# Patient Record
Sex: Female | Born: 1970 | Race: Black or African American | Hispanic: No | Marital: Married | Smoking: Never smoker
Health system: Southern US, Community
[De-identification: ages and names within clinical notes are randomized; demographics above are authoritative.]

## PROBLEM LIST (undated history)

## (undated) DIAGNOSIS — F419 Anxiety disorder, unspecified: Secondary | ICD-10-CM

## (undated) DIAGNOSIS — C50919 Malignant neoplasm of unspecified site of unspecified female breast: Secondary | ICD-10-CM

## (undated) DIAGNOSIS — Z803 Family history of malignant neoplasm of breast: Secondary | ICD-10-CM

## (undated) DIAGNOSIS — T7840XA Allergy, unspecified, initial encounter: Secondary | ICD-10-CM

## (undated) DIAGNOSIS — K635 Polyp of colon: Secondary | ICD-10-CM

## (undated) DIAGNOSIS — O009 Unspecified ectopic pregnancy without intrauterine pregnancy: Secondary | ICD-10-CM

## (undated) DIAGNOSIS — Z8042 Family history of malignant neoplasm of prostate: Secondary | ICD-10-CM

## (undated) DIAGNOSIS — Z923 Personal history of irradiation: Secondary | ICD-10-CM

## (undated) HISTORY — DX: Family history of malignant neoplasm of prostate: Z80.42

## (undated) HISTORY — DX: Malignant neoplasm of unspecified site of unspecified female breast: C50.919

## (undated) HISTORY — DX: Polyp of colon: K63.5

## (undated) HISTORY — DX: Family history of malignant neoplasm of breast: Z80.3

---
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
## 1990-11-17 HISTORY — PX: LYMPHADENECTOMY: SHX15

## 1996-11-17 HISTORY — PX: CHOLECYSTECTOMY: SHX55

## 1998-03-20 ENCOUNTER — Ambulatory Visit (HOSPITAL_COMMUNITY): Admission: RE | Admit: 1998-03-20 | Discharge: 1998-03-21 | Payer: Self-pay | Admitting: General Surgery

## 1998-05-08 ENCOUNTER — Ambulatory Visit (HOSPITAL_COMMUNITY): Admission: RE | Admit: 1998-05-08 | Discharge: 1998-05-08 | Payer: Self-pay | Admitting: Obstetrics

## 1998-07-04 ENCOUNTER — Other Ambulatory Visit: Admission: RE | Admit: 1998-07-04 | Discharge: 1998-07-04 | Payer: Self-pay | Admitting: Obstetrics

## 1998-08-16 ENCOUNTER — Ambulatory Visit (HOSPITAL_COMMUNITY): Admission: RE | Admit: 1998-08-16 | Discharge: 1998-08-16 | Payer: Self-pay | Admitting: Obstetrics

## 1998-08-16 ENCOUNTER — Other Ambulatory Visit: Admission: RE | Admit: 1998-08-16 | Discharge: 1998-08-16 | Payer: Self-pay | Admitting: Obstetrics

## 2000-04-27 ENCOUNTER — Encounter: Payer: Self-pay | Admitting: Obstetrics

## 2000-04-27 ENCOUNTER — Inpatient Hospital Stay (HOSPITAL_COMMUNITY): Admission: AD | Admit: 2000-04-27 | Discharge: 2000-04-27 | Payer: Self-pay | Admitting: Obstetrics

## 2000-05-04 ENCOUNTER — Other Ambulatory Visit: Admission: RE | Admit: 2000-05-04 | Discharge: 2000-05-04 | Payer: Self-pay | Admitting: Obstetrics

## 2000-11-02 ENCOUNTER — Observation Stay (HOSPITAL_COMMUNITY): Admission: AD | Admit: 2000-11-02 | Discharge: 2000-11-03 | Payer: Self-pay | Admitting: Obstetrics

## 2000-11-03 ENCOUNTER — Encounter: Payer: Self-pay | Admitting: *Deleted

## 2000-11-22 ENCOUNTER — Inpatient Hospital Stay (HOSPITAL_COMMUNITY): Admission: AD | Admit: 2000-11-22 | Discharge: 2000-11-22 | Payer: Self-pay | Admitting: Obstetrics

## 2000-11-26 ENCOUNTER — Inpatient Hospital Stay (HOSPITAL_COMMUNITY): Admission: AD | Admit: 2000-11-26 | Discharge: 2000-11-30 | Payer: Self-pay | Admitting: Obstetrics

## 2000-11-26 ENCOUNTER — Encounter: Payer: Self-pay | Admitting: Obstetrics

## 2005-10-17 HISTORY — PX: SALPINGECTOMY: SHX328

## 2005-11-04 ENCOUNTER — Inpatient Hospital Stay (HOSPITAL_COMMUNITY): Admission: AD | Admit: 2005-11-04 | Discharge: 2005-11-04 | Payer: Self-pay | Admitting: Obstetrics

## 2005-11-07 ENCOUNTER — Inpatient Hospital Stay (HOSPITAL_COMMUNITY): Admission: AD | Admit: 2005-11-07 | Discharge: 2005-11-07 | Payer: Self-pay | Admitting: Obstetrics

## 2005-11-10 ENCOUNTER — Inpatient Hospital Stay (HOSPITAL_COMMUNITY): Admission: AD | Admit: 2005-11-10 | Discharge: 2005-11-10 | Payer: Self-pay | Admitting: Obstetrics

## 2005-11-12 ENCOUNTER — Encounter (INDEPENDENT_AMBULATORY_CARE_PROVIDER_SITE_OTHER): Payer: Self-pay | Admitting: Specialist

## 2005-11-12 ENCOUNTER — Inpatient Hospital Stay (HOSPITAL_COMMUNITY): Admission: AD | Admit: 2005-11-12 | Discharge: 2005-11-14 | Payer: Self-pay | Admitting: Obstetrics

## 2006-10-12 ENCOUNTER — Emergency Department (HOSPITAL_COMMUNITY): Admission: EM | Admit: 2006-10-12 | Discharge: 2006-10-12 | Payer: Self-pay | Admitting: Family Medicine

## 2006-11-04 ENCOUNTER — Emergency Department (HOSPITAL_COMMUNITY): Admission: EM | Admit: 2006-11-04 | Discharge: 2006-11-04 | Payer: Self-pay | Admitting: Family Medicine

## 2007-02-27 ENCOUNTER — Inpatient Hospital Stay (HOSPITAL_COMMUNITY): Admission: AD | Admit: 2007-02-27 | Discharge: 2007-02-27 | Payer: Self-pay | Admitting: Obstetrics

## 2007-03-02 ENCOUNTER — Inpatient Hospital Stay (HOSPITAL_COMMUNITY): Admission: AD | Admit: 2007-03-02 | Discharge: 2007-03-02 | Payer: Self-pay | Admitting: Obstetrics

## 2007-03-04 ENCOUNTER — Observation Stay (HOSPITAL_COMMUNITY): Admission: AD | Admit: 2007-03-04 | Discharge: 2007-03-05 | Payer: Self-pay | Admitting: Obstetrics

## 2007-03-04 ENCOUNTER — Inpatient Hospital Stay (HOSPITAL_COMMUNITY): Admission: AD | Admit: 2007-03-04 | Discharge: 2007-03-04 | Payer: Self-pay | Admitting: Obstetrics

## 2007-03-08 ENCOUNTER — Inpatient Hospital Stay (HOSPITAL_COMMUNITY): Admission: AD | Admit: 2007-03-08 | Discharge: 2007-03-08 | Payer: Self-pay | Admitting: Obstetrics

## 2007-03-15 ENCOUNTER — Inpatient Hospital Stay (HOSPITAL_COMMUNITY): Admission: AD | Admit: 2007-03-15 | Discharge: 2007-03-15 | Payer: Self-pay | Admitting: Obstetrics

## 2007-03-22 ENCOUNTER — Inpatient Hospital Stay (HOSPITAL_COMMUNITY): Admission: AD | Admit: 2007-03-22 | Discharge: 2007-03-22 | Payer: Self-pay | Admitting: Obstetrics

## 2007-03-29 ENCOUNTER — Inpatient Hospital Stay (HOSPITAL_COMMUNITY): Admission: AD | Admit: 2007-03-29 | Discharge: 2007-03-29 | Payer: Self-pay | Admitting: Obstetrics

## 2009-06-05 ENCOUNTER — Ambulatory Visit (HOSPITAL_COMMUNITY): Admission: RE | Admit: 2009-06-05 | Discharge: 2009-06-05 | Payer: Self-pay | Admitting: Obstetrics

## 2010-12-08 ENCOUNTER — Encounter: Payer: Self-pay | Admitting: Obstetrics

## 2011-04-04 NOTE — Op Note (Signed)
NAME:  Rhonda Moss, Rhonda Moss               ACCOUNT NO.:  [id number]   MEDICAL RECORD NO.:  [id number]          PATIENT TYPE:  INP   LOCATION:  9310                          FACILITY:  [facility]   PHYSICIAN:  Kathreen Cosier, M.D.DATE OF BIRTH:  26-May-1971   DATE OF PROCEDURE:  11/12/2005  DATE OF DISCHARGE:                                 OPERATIVE REPORT   PREOPERATIVE DIAGNOSIS:  Ruptured ectopic pregnancy.   SURGEON:  Kathreen Cosier, M.D.   FIRST ASSISTANT:  Charles A. Clearance Coots, M.D.   ANESTHESIA:  General.   DESCRIPTION OF PROCEDURE:  The patient was placed on the operating room  table in the supine position.  Abdomen prepped and draped.  Bladder emptied  with the Foley catheter.  A transverse incision was made through the old  scar approximately four inches long, carried down to the fascia.  The fascia  was cleaned and incised the length of the incision.  Rectus muscles were  retracted laterally, peritoneum incised longitudinally.  There was about 200  mL of free blood in the peritoneal cavity.  The right tube and ovary were  normal.  Left ovary normal.  There was a large left ectopic pregnancy  occupying almost the complete tube and this was ruptured.  Kelly clamps  placed across the mesosalpinx below the tube and then the tube removed with  Metzenbaum scissors.  Hemostasis was achieved with interrupted sutures of 0  chromic. Hemostasis was satisfactory.  Abdomen closed in layers, peritoneum  continuous suture of 0 chromic, fascia with continuous suture of 0 Dexon and  the skin closed with subcuticular suture of 4-0 Monocryl.  The patient  tolerated the procedure well and taken to the recovery room in good  condition.           ______________________________  Kathreen Cosier, M.D.     BAM/MEDQ  D:  11/12/2005  T:  11/12/2005  Job:  [id number]

## 2011-04-04 NOTE — Op Note (Signed)
[facility]  Patient:    Rhonda Moss, Rhonda Moss                        MRN: [id number] Proc. Date: 11/27/00 Adm. Date:  [id number] Attending:  Venita Sheffield                           Operative Report  PREOPERATIVE DIAGNOSIS:       Prolonged ruptured membranes, failed induction                               of labor, at term.  SURGEON:                      Kathreen Cosier, M.D.  ANESTHESIA:                   Spinal.  DESCRIPTION OF PROCEDURE:     The patient is on the operating table in the supine position.  After the spinal anesthetic was administered, the abdomen was prepped and draped, done after the Foley catheter.  A transverse suprapubic incision was made and carried down to the rectus fascia.  The fascia was  cleanly incised the length of the incision.  The recti muscle were retracted laterally.  The peritoneum was incised longitudinally.  A transverse incision was made on the visceral peritoneum above the bladder, and the bladder was mobilized inferiorly.  A transverse lower uterine incision was made.  The fluid was clear.  The patient was delivered from the LAO position above [place].  Apgars were 8 and 9, weighing 7 pounds 8 ounces.  The team was in attendance.  The placenta was anterior and was removed manually.  The uterine cavity was cleaned with dry laps.  The uterine incision was closed in two layers with continuous suture of #1 chromic.  Hemostasis was satisfactory. The bladder flap was reattached with #2-0 chromic.  The lap and sponge counts were correct.  The abdomen was closed in layers.  The peritoneum closed with continuous suture of #0 chromic, the fascia with continuous suture of #0 Dexon, the skin closed with a subcuticular stitch of #0 plain.  The blood loss was 1000 cc. DD:  11/27/00 TD:  11/28/00 Job: [id number]

## 2011-04-04 NOTE — H&P (Signed)
[facility]  Patient:    LINDZEY, ZENT                        MRN: [id number] Adm. Date:  [id number] Attending:  Venita Sheffield                         History and Physical                                Patient is a 39 year old gravida 4, para 1-0-2-1 [facility] December 12, 2000 admitted on January 10 in the a.m. with ruptured membranes which occurred at 2:30 a.m.  Fluid was clear.  Negative group B strep.  Cervix was 1 cm, presenting, ______ floating.  Abdomen was obese and estimated fetal weight was done by ultrasound given estimated fetal weight 3823 g.  The patient was started on Pitocin.  She received Pitocin stimulation all day on January 10 and was rested overnight on January 10.  It was restarted 4 a.m. on January 11.  Patient received Pitocin up to 3:45 p.m. on the 11th.  She was contracting every two minutes but ______ cervix ______. It was decided she would deliver by cesarean section because of prolonged ruptured membranes, failed induction.  PHYSICAL EXAMINATION:  GENERAL:                      Obese female in no acute distress.  HEENT:                        Negative.  LUNGS:                        Clear.  HEART:                        Regular rhythm without murmurs, rubs, or gallops.  ABDOMEN:                      Term sized uterus.  PELVIC:                       Described above.  EXTREMITIES:                  Negative. DD:  11/27/00 TD:  11/27/00 Job: [id number]

## 2011-04-04 NOTE — Discharge Summary (Signed)
[facility]  Patient:    Rhonda Moss, Rhonda Moss                        MRN: [id number] Adm. Date:  [id number] Disc. Date: 11/30/00 Attending:  Venita Sheffield                           Discharge Summary  HISTORY OF PRESENT ILLNESS:   The patient is a 41 year old gravida 4, para 1-0-2-1, [facility] December 12, 2000, who was admitted with ruptured membranes.  On admission, the cervix was 1 cm, with the vertex floating, very obese abdomen. Ultrasound was performed.  Estimated fetal weight of 3823 g.  HOSPITAL COURSE:              She was started on Pitocin, and she received Pitocin over two days and did not go into labor.  She underwent a primary low transverse cesarean section for prolonged ruptured membranes, failed induction.  She had a female, Apgars 8 and 9, weight 7 pounds 8 ounces. Postoperatively she did well.  She was discharged home on the third postoperative day, to return to a regular diet.  Hemoglobin 8.1.  DISCHARGE INSTRUCTIONS:       Tylenol No. 3 one q.4h. p.r.n.  To see me in six weeks.  DISCHARGE DIAGNOSES:          1. Status post primary low transverse cesarean                                  section at term for prolonged ruptured                                  membranes.                               2. Failed induction. DD:  11/30/00 TD:  11/30/00 Job: [id number]

## 2011-04-04 NOTE — Discharge Summary (Signed)
NAME:  Rhonda Moss, Rhonda Moss               ACCOUNT NO.:  [id number]   MEDICAL RECORD NO.:  [id number]          PATIENT TYPE:  INP   LOCATION:  9310                          FACILITY:  [facility]   PHYSICIAN:  Kathreen Cosier, M.D.DATE OF BIRTH:  [birth year]   DATE OF ADMISSION:  11/12/2005  DATE OF DISCHARGE:  11/14/2005                                 DISCHARGE SUMMARY   The patient is a 41 year old gravida 5, para 2-0-2-2 whose last menstrual  period was September 13, 2005.  She was seen with quants of 1234 and 3100 on  December 19.  Ultrasound showed a 1.8 cm unruptured left ectopic.  She  received a methotrexate protocol and received methotrexate x2.  Patient was  admitted on the morning of December 27, with sudden onset of lower abdominal  pain and tenderness throughout all quadrants and her diagnosis was a  ruptured ectopic pregnancy and she was taken to the operating room where she  had a ruptured left ectopic and had a left salpingectomy performed.  Her  right ovary and tube were normal.  Her left ovary was normal.  On admission  her hemoglobin was 10.7, postoperative 9.4, platelets 328/281, white count  9.1/7.6.  She did well postoperative and was discharged on the second  postoperative day on Tylox for pain, ferrous sulfate, to see me in 2 weeks.   DISCHARGE DIAGNOSIS:  Status post exploratory laparoscopic and left  salpingectomy for ruptured ectopic pregnancy.           ______________________________  Kathreen Cosier, M.D.     BAM/MEDQ  D:  11/14/2005  T:  11/14/2005  Job:  [id number]

## 2011-08-14 ENCOUNTER — Emergency Department (HOSPITAL_COMMUNITY)
Admission: EM | Admit: 2011-08-14 | Discharge: 2011-08-14 | Disposition: A | Discharge status: Home / Self Care | Payer: Medicaid Other | Attending: Emergency Medicine | Admitting: Emergency Medicine

## 2011-08-14 ENCOUNTER — Emergency Department (HOSPITAL_COMMUNITY): Payer: Medicaid Other

## 2011-08-14 DIAGNOSIS — R110 Nausea: Secondary | ICD-10-CM | POA: Insufficient documentation

## 2011-08-14 DIAGNOSIS — Z9089 Acquired absence of other organs: Secondary | ICD-10-CM | POA: Insufficient documentation

## 2011-08-14 DIAGNOSIS — M545 Low back pain, unspecified: Secondary | ICD-10-CM | POA: Insufficient documentation

## 2011-08-14 DIAGNOSIS — N949 Unspecified condition associated with female genital organs and menstrual cycle: Secondary | ICD-10-CM | POA: Insufficient documentation

## 2011-08-14 DIAGNOSIS — R1031 Right lower quadrant pain: Secondary | ICD-10-CM | POA: Insufficient documentation

## 2011-08-14 DIAGNOSIS — R197 Diarrhea, unspecified: Secondary | ICD-10-CM | POA: Insufficient documentation

## 2011-08-14 LAB — DIFFERENTIAL
Basophils Relative: 0 % (ref 0–1)
Lymphs Abs: 1.2 10*3/uL (ref 0.7–4.0)
Monocytes Absolute: 0.4 10*3/uL (ref 0.1–1.0)
Monocytes Relative: 4 % (ref 3–12)
Neutro Abs: 8.3 10*3/uL — ABNORMAL HIGH (ref 1.7–7.7)

## 2011-08-14 LAB — URINALYSIS, ROUTINE W REFLEX MICROSCOPIC
Bilirubin Urine: NEGATIVE
Glucose, UA: NEGATIVE mg/dL
Hgb urine dipstick: NEGATIVE
Protein, ur: NEGATIVE mg/dL

## 2011-08-14 LAB — CBC
Hemoglobin: 11.1 g/dL — ABNORMAL LOW (ref 12.0–15.0)
MCH: 30.2 pg (ref 26.0–34.0)
MCHC: 33.3 g/dL (ref 30.0–36.0)
MCV: 90.5 fL (ref 78.0–100.0)

## 2011-08-14 LAB — BASIC METABOLIC PANEL
BUN: 8 mg/dL (ref 6–23)
CO2: 23 mEq/L (ref 19–32)
Chloride: 102 mEq/L (ref 96–112)
Creatinine, Ser: 0.98 mg/dL (ref 0.50–1.10)
Glucose, Bld: 114 mg/dL — ABNORMAL HIGH (ref 70–99)
Potassium: 2.9 mEq/L — ABNORMAL LOW (ref 3.5–5.1)

## 2011-08-14 MED ORDER — IOHEXOL 300 MG/ML  SOLN
100.0000 mL | Freq: Once | INTRAMUSCULAR | Status: AC | PRN
  Administered 2011-08-14: 100 mL via INTRAVENOUS

## 2011-09-18 ENCOUNTER — Encounter (INDEPENDENT_AMBULATORY_CARE_PROVIDER_SITE_OTHER): Payer: Self-pay

## 2011-09-23 ENCOUNTER — Encounter (INDEPENDENT_AMBULATORY_CARE_PROVIDER_SITE_OTHER): Payer: Self-pay | Admitting: General Surgery

## 2011-09-23 ENCOUNTER — Ambulatory Visit (INDEPENDENT_AMBULATORY_CARE_PROVIDER_SITE_OTHER): Payer: Medicaid Other | Admitting: General Surgery

## 2011-09-23 VITALS — BP 152/94 | HR 72 | Temp 98.2°F | Resp 18 | Ht 67.5 in | Wt 270.8 lb

## 2011-09-23 DIAGNOSIS — R1031 Right lower quadrant pain: Secondary | ICD-10-CM

## 2011-09-23 NOTE — Progress Notes (Signed)
No chief complaint on file.   HPI Rhonda Moss is a 40 y.o. female.  The patient comes in for evaluation of right-sided lower abdominal pain extending across her right flank to her back on occasion. Her first episode happened in September and it has continued intermittently until today. HPI  No past medical history on file.  Past Surgical History  Procedure Date  . Lymphadenectomy 1992  . Tubal ligation 2009  . Cervical biopsy  w/ loop electrode excision     Family History  Problem Relation Age of Onset  . Breast cancer Mother   . Breast cancer Sister   . Lung cancer      uncle    Social History History  Substance Use Topics  . Smoking status: Never Smoker   . Smokeless tobacco: Not on file  . Alcohol Use: Yes    Allergies  Allergen Reactions  . Sulfa Antibiotics     Current Outpatient Prescriptions  Medication Sig Dispense Refill  . HYDROcodone-acetaminophen (NORCO) 5-325 MG per tablet Take 1 tablet by mouth every 6 (six) hours as needed.          Review of Systems Review of Systems  Constitutional: Negative.   HENT: Negative.   Eyes: Negative.   Respiratory: Negative.   Cardiovascular: Negative.   Gastrointestinal: Positive for abdominal pain (RLQ and right flank).  Musculoskeletal: Negative.     Blood pressure 152/94, pulse 72, temperature 98.2 F (36.8 C), resp. rate 18, height 5' 7.5" (1.715 m), weight 270 lb 12.8 oz (122.834 kg).  Physical Exam Physical Exam  Constitutional: She is oriented to person, place, and time. She appears well-developed and well-nourished.  HENT:  Head: Normocephalic and atraumatic.  Eyes: Conjunctivae and EOM are normal. Pupils are equal, round, and reactive to light.  Cardiovascular: Normal rate, regular rhythm and normal heart sounds.   Pulmonary/Chest: Effort normal and breath sounds normal.  Abdominal: She exhibits no distension and no mass. There is no tenderness. There is no rebound and no guarding.    Musculoskeletal: Normal range of motion.  Neurological: She is alert and oriented to person, place, and time. She has normal reflexes.  Skin: Skin is warm and dry.  Psychiatric: She has a normal mood and affect. Her behavior is normal. Judgment and thought content normal.    Data Reviewed I was able to review the report from the patient's CT scan and ultrasounds. Based away from definitively diagnose and the medical staff for taking him but described it as a tubular structure in the right lower quadrant adjacent to her pelvic structures and her normal appendix  Assessment    Abdominal pain associated with a normal appendix and a possible Meckel's diverticulum. The patient does have a history of a salpingectomy a nephrectomy on the right side from a previous ectopic pregnancy. Some of her pain may be related to scar tissue from that surgery.    Plan    Prior to undergoing a laparoscopic procedure with possible Meckel's diverticulectomy, the patient is a medical scan. We will get disorder to soon as possible and depending upon the results of that scan the patient may require diagnostic and therapeutic laparoscopy.  I will see the patient again in 2 weeks.       Koury Roddy III,Wayne Brunker O 09/23/2011, 11:11 AM

## 2011-10-06 ENCOUNTER — Encounter (HOSPITAL_COMMUNITY)
Admission: RE | Admit: 2011-10-06 | Discharge: 2011-10-06 | Disposition: A | Discharge status: Home / Self Care | Payer: BC Managed Care – PPO | Source: Ambulatory Visit | Attending: General Surgery | Admitting: General Surgery

## 2011-10-06 DIAGNOSIS — R1031 Right lower quadrant pain: Secondary | ICD-10-CM

## 2011-10-06 MED ORDER — SODIUM PERTECHNETATE TC 99M INJECTION
10.0000 | Freq: Once | INTRAVENOUS | Status: AC | PRN
  Administered 2011-10-06: 10 via INTRAVENOUS

## 2011-10-07 ENCOUNTER — Encounter (INDEPENDENT_AMBULATORY_CARE_PROVIDER_SITE_OTHER): Payer: Medicaid Other | Admitting: General Surgery

## 2011-10-29 ENCOUNTER — Other Ambulatory Visit (HOSPITAL_COMMUNITY): Payer: Self-pay | Admitting: Obstetrics

## 2011-10-29 DIAGNOSIS — Z1231 Encounter for screening mammogram for malignant neoplasm of breast: Secondary | ICD-10-CM

## 2011-10-30 ENCOUNTER — Other Ambulatory Visit (HOSPITAL_COMMUNITY): Payer: Self-pay | Admitting: Obstetrics

## 2011-10-30 DIAGNOSIS — N83209 Unspecified ovarian cyst, unspecified side: Secondary | ICD-10-CM

## 2011-11-17 ENCOUNTER — Inpatient Hospital Stay (HOSPITAL_COMMUNITY): Admission: RE | Admit: 2011-11-17 | Payer: Medicaid Other | Source: Ambulatory Visit

## 2011-12-04 ENCOUNTER — Ambulatory Visit (HOSPITAL_COMMUNITY)
Admission: RE | Admit: 2011-12-04 | Discharge: 2011-12-04 | Disposition: A | Discharge status: Home / Self Care | Payer: Medicaid Other | Source: Ambulatory Visit | Attending: Obstetrics | Admitting: Obstetrics

## 2011-12-04 DIAGNOSIS — Z1231 Encounter for screening mammogram for malignant neoplasm of breast: Secondary | ICD-10-CM | POA: Insufficient documentation

## 2012-01-20 ENCOUNTER — Other Ambulatory Visit (HOSPITAL_COMMUNITY): Payer: Self-pay | Admitting: Obstetrics

## 2012-01-20 DIAGNOSIS — IMO0002 Reserved for concepts with insufficient information to code with codable children: Secondary | ICD-10-CM

## 2012-01-23 ENCOUNTER — Ambulatory Visit (HOSPITAL_COMMUNITY)
Admission: RE | Admit: 2012-01-23 | Discharge: 2012-01-23 | Disposition: A | Discharge status: Home / Self Care | Payer: Medicaid Other | Source: Ambulatory Visit | Attending: Obstetrics | Admitting: Obstetrics

## 2012-01-23 DIAGNOSIS — N838 Other noninflammatory disorders of ovary, fallopian tube and broad ligament: Secondary | ICD-10-CM | POA: Insufficient documentation

## 2012-01-23 DIAGNOSIS — IMO0002 Reserved for concepts with insufficient information to code with codable children: Secondary | ICD-10-CM

## 2012-01-23 DIAGNOSIS — N949 Unspecified condition associated with female genital organs and menstrual cycle: Secondary | ICD-10-CM | POA: Insufficient documentation

## 2013-01-13 ENCOUNTER — Other Ambulatory Visit (HOSPITAL_COMMUNITY): Payer: Self-pay | Admitting: Obstetrics

## 2013-01-13 DIAGNOSIS — Z1231 Encounter for screening mammogram for malignant neoplasm of breast: Secondary | ICD-10-CM

## 2013-01-24 ENCOUNTER — Ambulatory Visit (HOSPITAL_COMMUNITY)
Admission: RE | Admit: 2013-01-24 | Discharge: 2013-01-24 | Disposition: A | Discharge status: Home / Self Care | Payer: Medicaid Other | Source: Ambulatory Visit | Attending: Obstetrics | Admitting: Obstetrics

## 2013-01-24 DIAGNOSIS — Z1231 Encounter for screening mammogram for malignant neoplasm of breast: Secondary | ICD-10-CM | POA: Insufficient documentation

## 2013-05-04 ENCOUNTER — Other Ambulatory Visit: Payer: Self-pay | Admitting: Ophthalmology

## 2013-05-04 ENCOUNTER — Ambulatory Visit
Admission: RE | Admit: 2013-05-04 | Discharge: 2013-05-04 | Disposition: A | Discharge status: Home / Self Care | Payer: Medicaid Other | Source: Ambulatory Visit | Attending: Ophthalmology | Admitting: Ophthalmology

## 2013-05-04 DIAGNOSIS — H5010 Unspecified exotropia: Secondary | ICD-10-CM

## 2013-05-04 DIAGNOSIS — H532 Diplopia: Secondary | ICD-10-CM

## 2013-09-22 ENCOUNTER — Other Ambulatory Visit (HOSPITAL_COMMUNITY)
Admission: RE | Admit: 2013-09-22 | Discharge: 2013-09-22 | Disposition: A | Discharge status: Home / Self Care | Payer: Medicaid Other | Source: Ambulatory Visit | Attending: Emergency Medicine | Admitting: Emergency Medicine

## 2013-09-22 ENCOUNTER — Encounter (HOSPITAL_COMMUNITY): Payer: Self-pay | Admitting: Emergency Medicine

## 2013-09-22 ENCOUNTER — Emergency Department (INDEPENDENT_AMBULATORY_CARE_PROVIDER_SITE_OTHER)
Admission: EM | Admit: 2013-09-22 | Discharge: 2013-09-22 | Disposition: A | Discharge status: Home / Self Care | Payer: Medicaid Other | Source: Home / Self Care | Attending: Emergency Medicine | Admitting: Emergency Medicine

## 2013-09-22 DIAGNOSIS — N760 Acute vaginitis: Secondary | ICD-10-CM | POA: Insufficient documentation

## 2013-09-22 DIAGNOSIS — Z113 Encounter for screening for infections with a predominantly sexual mode of transmission: Secondary | ICD-10-CM | POA: Insufficient documentation

## 2013-09-22 DIAGNOSIS — N766 Ulceration of vulva: Secondary | ICD-10-CM

## 2013-09-22 DIAGNOSIS — N9489 Other specified conditions associated with female genital organs and menstrual cycle: Secondary | ICD-10-CM

## 2013-09-22 MED ORDER — METRONIDAZOLE 0.75 % VA GEL: 1.0000 | Freq: Two times a day (BID) | VAGINAL | Status: DC

## 2013-09-22 MED ORDER — FLUCONAZOLE 150 MG PO TABS: 150.0000 mg | ORAL_TABLET | Freq: Once | ORAL | Status: DC

## 2013-09-22 NOTE — ED Provider Notes (Signed)
Chief Complaint:   Chief Complaint  Patient presents with  . Vaginal Discharge    History of Present Illness:   Rhonda Moss is a 42 year old female who has had a three-day history of vaginal discharge, odor, burning, and itching. She has an irritated area just above her clitoris. She's had this irritated area before. She states she's been tested by her gynecologist for herpes and has not tested positive for herpes. She states it often seems to be due to rough sex. She has had bacterial vaginosis in the past. She's been treated with both pills and the metronidazole gel. She prefers the gel. She denies any pelvic pain, urinary symptoms, lower back pain, fever, chills, nausea, or vomiting. She has a NuvaRing in place. Last menstrual period began a week ago. She is sexually active.  Review of Systems:  Other than noted above, the patient denies any of the following symptoms: Systemic:  No fever, chills, sweats, or weight loss. GI:  No abdominal pain, nausea, anorexia, vomiting, diarrhea, constipation, melena or hematochezia. GU:  No dysuria, frequency, urgency, hematuria, vaginal discharge, itching, or abnormal vaginal bleeding. Skin:  No rash or itching.  PMFSH:  Past medical history, family history, social history, meds, and allergies were reviewed.  She is allergic to sulfa. She takes Zyrtec for allergies.  Physical Exam:   Vital signs:  BP 162/112  Pulse 72  Temp(Src) 98.7 F (37.1 C) (Oral)  Resp 18  SpO2 100%  LMP 09/19/2013 General:  Alert, oriented and in no distress. Lungs:  Breath sounds clear and equal bilaterally.  No wheezes, rales or rhonchi. Heart:  Regular rhythm.  No gallops or murmers. Abdomen:  Soft, flat and non-distended.  No organomegaly or mass.  No tenderness, guarding or rebound.  Bowel sounds normally active. Pelvic exam:  There is a tiny, shallow ulcer, just above the clitoris. Otherwise external genitalia are normal. There is a thick, yellowish, malodorous  vaginal discharge. She has a NuvaRing in place. She does have some mucoid discharge coming from the cervical or. There was no cervical motion pain. Uterus was normal in size and shape and nontender. No adnexal tenderness or mass. Herpes simplex culture was obtained as well as DNA probe for gonorrhea, Chlamydia, Trichomonas, Gardnerella, Candida. Skin:  Clear, warm and dry.  Other Labs Obtained at Urgent Care Center:  Also obtained were serologies for syphilis and herpes simplex type 2.  Results are pending at this time and we will call about any positive results.  Assessment:  The primary encounter diagnosis was Vaginitis. A diagnosis of Genital ulcer, female was also pertinent to this visit.  Vaginitis most likely bacterial vaginosis, although with itching, also need to treat for Candida as well. The ulcer needs further workup, although all of our treatment until we get results back of the herpes simplex culture, herpes simplex serology, and syphilis serology.  Plan:   1.  Meds:  The following meds were prescribed:   Discharge Medication List as of 09/22/2013  8:10 PM    START taking these medications   Details  fluconazole (DIFLUCAN) 150 MG tablet Take 1 tablet (150 mg total) by mouth once., Starting 09/22/2013, Normal    metroNIDAZOLE (METROGEL) 0.75 % vaginal gel Place 1 Applicatorful vaginally 2 (two) times daily., Starting 09/22/2013, Until Discontinued, Normal        2.  Patient Education/Counseling:  The patient was given appropriate handouts, self care instructions, and instructed in symptomatic relief.  Suggested probiotics for prevention of bacterial vaginosis.  3.  Follow up:  The patient was told to follow up if no better in 3 to 4 days, if becoming worse in any way, and given some red flag symptoms such as pelvic pain, fever, or vomiting which would prompt immediate return.  Follow up here as needed.     Reuben Likes, MD 09/22/13 [id number]

## 2013-09-22 NOTE — ED Notes (Signed)
Reports vaginal discharge , burning, itching for 3 days .  Reports a history of recurring bv.  Patient also concerned for yeast.

## 2013-09-22 NOTE — ED Notes (Signed)
Verified phone number as listed in computer

## 2013-09-26 LAB — HERPES SIMPLEX VIRUS CULTURE

## 2013-09-27 ENCOUNTER — Telehealth (HOSPITAL_COMMUNITY): Payer: Self-pay | Admitting: *Deleted

## 2013-09-27 NOTE — ED Notes (Signed)
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  Pt. adequately treated with Metrogel. I called pt. And left a message to call. Pt. Called back Pt. verified x 2 and given results.  Pt. Told she was adequately treated for bacterial vaginosis with Metrogel and to finish the medication. 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 Pt. Denies ever having a painful sore or rash. Pt. instructed to notify her partner. You can pass the virus even when you don't have an outbreak, so always practice safe sex. Get treated for each outbreak with Acyclovir or Valtrex. You may want to get an OB-GYN doctor who can call in a prescription for you when you have an outbreak. Pt. voiced understanding. Vassie Moselle 09/27/2013

## 2014-02-02 ENCOUNTER — Other Ambulatory Visit (HOSPITAL_COMMUNITY): Payer: Self-pay | Admitting: Obstetrics

## 2014-02-02 DIAGNOSIS — Z1231 Encounter for screening mammogram for malignant neoplasm of breast: Secondary | ICD-10-CM

## 2014-02-10 ENCOUNTER — Ambulatory Visit (HOSPITAL_COMMUNITY)
Admission: RE | Admit: 2014-02-10 | Discharge: 2014-02-10 | Disposition: A | Discharge status: Home / Self Care | Payer: Medicaid Other | Source: Ambulatory Visit | Attending: Obstetrics | Admitting: Obstetrics

## 2014-02-10 DIAGNOSIS — Z1231 Encounter for screening mammogram for malignant neoplasm of breast: Secondary | ICD-10-CM | POA: Insufficient documentation

## 2014-11-17 HISTORY — PX: STRABISMUS SURGERY: SHX218

## 2014-12-03 ENCOUNTER — Encounter (HOSPITAL_COMMUNITY): Payer: Self-pay | Admitting: Nurse Practitioner

## 2014-12-03 ENCOUNTER — Emergency Department (HOSPITAL_COMMUNITY): Payer: Medicaid Other

## 2014-12-03 ENCOUNTER — Emergency Department (HOSPITAL_COMMUNITY)
Admission: EM | Admit: 2014-12-03 | Discharge: 2014-12-03 | Disposition: A | Discharge status: Home / Self Care | Payer: Medicaid Other | Attending: Emergency Medicine | Admitting: Emergency Medicine

## 2014-12-03 DIAGNOSIS — Z3202 Encounter for pregnancy test, result negative: Secondary | ICD-10-CM | POA: Diagnosis not present

## 2014-12-03 DIAGNOSIS — R1031 Right lower quadrant pain: Secondary | ICD-10-CM | POA: Diagnosis not present

## 2014-12-03 DIAGNOSIS — R102 Pelvic and perineal pain: Secondary | ICD-10-CM | POA: Diagnosis present

## 2014-12-03 HISTORY — DX: Unspecified ectopic pregnancy without intrauterine pregnancy: O00.90

## 2014-12-03 LAB — URINALYSIS, ROUTINE W REFLEX MICROSCOPIC
Bilirubin Urine: NEGATIVE
Glucose, UA: NEGATIVE mg/dL
Hgb urine dipstick: NEGATIVE
Ketones, ur: 15 mg/dL — AB
Leukocytes, UA: NEGATIVE
Nitrite: NEGATIVE
Protein, ur: NEGATIVE mg/dL
Specific Gravity, Urine: 1.024 (ref 1.005–1.030)
Urobilinogen, UA: 0.2 mg/dL (ref 0.0–1.0)
pH: 5.5 (ref 5.0–8.0)

## 2014-12-03 LAB — COMPREHENSIVE METABOLIC PANEL
ALBUMIN: 4 g/dL (ref 3.5–5.2)
ALK PHOS: 80 U/L (ref 39–117)
ALT: 14 U/L (ref 0–35)
AST: 24 U/L (ref 0–37)
Anion gap: 10 (ref 5–15)
BILIRUBIN TOTAL: 1.1 mg/dL (ref 0.3–1.2)
BUN: 7 mg/dL (ref 6–23)
CO2: 22 mmol/L (ref 19–32)
Calcium: 9 mg/dL (ref 8.4–10.5)
Chloride: 106 mEq/L (ref 96–112)
Creatinine, Ser: 1.06 mg/dL (ref 0.50–1.10)
GFR calc non Af Amer: 63 mL/min — ABNORMAL LOW (ref >90)
GFR, EST AFRICAN AMERICAN: 73 mL/min — AB (ref >90)
GLUCOSE: 109 mg/dL — AB (ref 70–99)
Potassium: 3.9 mmol/L (ref 3.5–5.1)
SODIUM: 138 mmol/L (ref 135–145)
TOTAL PROTEIN: 7.4 g/dL (ref 6.0–8.3)

## 2014-12-03 LAB — CBC
HCT: 32 % — ABNORMAL LOW (ref 36.0–46.0)
Hemoglobin: 10.4 g/dL — ABNORMAL LOW (ref 12.0–15.0)
MCH: 26.9 pg (ref 26.0–34.0)
MCHC: 32.5 g/dL (ref 30.0–36.0)
MCV: 82.7 fL (ref 78.0–100.0)
PLATELETS: 351 10*3/uL (ref 150–400)
RBC: 3.87 MIL/uL (ref 3.87–5.11)
RDW: 16.3 % — ABNORMAL HIGH (ref 11.5–15.5)
WBC: 8.1 10*3/uL (ref 4.0–10.5)

## 2014-12-03 LAB — WET PREP, GENITAL
Trich, Wet Prep: NONE SEEN
Yeast Wet Prep HPF POC: NONE SEEN

## 2014-12-03 LAB — POC URINE PREG, ED: PREG TEST UR: NEGATIVE

## 2014-12-03 MED ORDER — CEPHALEXIN 500 MG PO CAPS: 500.0000 mg | ORAL_CAPSULE | Freq: Four times a day (QID) | ORAL | Status: DC

## 2014-12-03 MED ORDER — HYDROMORPHONE HCL 1 MG/ML IJ SOLN
1.0000 mg | Freq: Once | INTRAMUSCULAR | Status: AC
Start: 2014-12-03 — End: 2014-12-03
  Administered 2014-12-03: 1 mg via INTRAVENOUS
  Filled 2014-12-03: qty 1

## 2014-12-03 MED ORDER — IOHEXOL 300 MG/ML  SOLN
100.0000 mL | Freq: Once | INTRAMUSCULAR | Status: AC | PRN
Start: 2014-12-03 — End: 2014-12-03
  Administered 2014-12-03: 100 mL via INTRAVENOUS

## 2014-12-03 MED ORDER — HYDROCODONE-ACETAMINOPHEN 5-325 MG PO TABS
2.0000 | ORAL_TABLET | Freq: Once | ORAL | Status: AC
  Administered 2014-12-03: 2 via ORAL
  Filled 2014-12-03: qty 2

## 2014-12-03 MED ORDER — HYDROCODONE-ACETAMINOPHEN 5-325 MG PO TABS: 1.0000 | ORAL_TABLET | Freq: Four times a day (QID) | ORAL | Status: DC | PRN

## 2014-12-03 MED ORDER — HYDROMORPHONE HCL 1 MG/ML IJ SOLN
1.0000 mg | Freq: Once | INTRAMUSCULAR | Status: AC
  Administered 2014-12-03: 1 mg via INTRAVENOUS
  Filled 2014-12-03: qty 1

## 2014-12-03 MED ORDER — HYDROMORPHONE HCL 1 MG/ML IJ SOLN: 1.0000 mg | Freq: Once | INTRAMUSCULAR | Status: DC

## 2014-12-03 MED ORDER — SODIUM CHLORIDE 0.9 % IV BOLUS (SEPSIS)
1000.0000 mL | Freq: Once | INTRAVENOUS | Status: AC
  Administered 2014-12-03: 1000 mL via INTRAVENOUS

## 2014-12-03 MED ORDER — CEPHALEXIN 250 MG PO CAPS
500.0000 mg | ORAL_CAPSULE | Freq: Once | ORAL | Status: AC
Start: 2014-12-03 — End: 2014-12-03
  Administered 2014-12-03: 500 mg via ORAL
  Filled 2014-12-03: qty 2

## 2014-12-03 NOTE — ED Provider Notes (Signed)
CSN: [id number]     Arrival date & time 12/03/14  1215 History   First MD Initiated Contact with Patient 12/03/14 1412     Chief Complaint  Patient presents with  . Pelvic Pain     (Consider location/radiation/quality/duration/timing/severity/associated sxs/prior Treatment) HPI Patient presents to the emergency department with sudden onset of right lower pelvic pain that started this morning.  The patient states that he has had a history of ectopic pregnancy.  She states that she had her tube removed due to destruction from the ectopic pregnancy.  Patient is unsure which side this was on.  Patient states that she has not had any nausea, vomiting, fever, dysuria, incontinence, chest pain, shortness of breath, weakness, dizziness, headache, blurred vision, or syncope.  The patient states that palpation and movement make the pain worse. Past Medical History  Diagnosis Date  . Ectopic pregnancy    Past Surgical History  Procedure Laterality Date  . Lymphadenectomy  1992  . Tubal ligation  2009  . Cervical biopsy  w/ loop electrode excision     Family History  Problem Relation Age of Onset  . Breast cancer Mother   . Breast cancer Sister   . Lung cancer      uncle   History  Substance Use Topics  . Smoking status: Never Smoker   . Smokeless tobacco: Not on file  . Alcohol Use: Yes   OB History    No data available     Review of Systems  All other systems negative except as documented in the HPI. All pertinent positives and negatives as reviewed in the HPI.  Allergies  Sulfa antibiotics  Home Medications   Prior to Admission medications   Medication Sig Start Date End Date Taking? Authorizing Provider  ibuprofen (ADVIL,MOTRIN) 200 MG tablet Take 200 mg by mouth every 6 (six) hours as needed for mild pain.   Yes Historical Provider, MD  fluconazole (DIFLUCAN) 150 MG tablet Take 1 tablet (150 mg total) by mouth once. Patient not taking: Reported on 12/03/2014 09/22/13    Harden Mo, MD  metroNIDAZOLE (METROGEL) 0.75 % vaginal gel Place 1 Applicatorful vaginally 2 (two) times daily. Patient not taking: Reported on 12/03/2014 09/22/13   Harden Mo, MD   BP 152/98 mmHg  Pulse 77  Temp(Src) 98.1 F (36.7 C) (Oral)  Resp 18  Ht 5' 7"  (1.702 m)  Wt 250 lb (113.399 kg)  BMI 39.15 kg/m2  SpO2 100%  LMP 10/26/2014 Physical Exam  Constitutional: She appears well-developed and well-nourished. No distress.  HENT:  Head: Normocephalic and atraumatic.  Mouth/Throat: Oropharynx is clear and moist.  Eyes: Pupils are equal, round, and reactive to light.  Neck: Normal range of motion. Neck supple.  Cardiovascular: Normal rate, regular rhythm and normal heart sounds.  Exam reveals no gallop and no friction rub.   No murmur heard. Pulmonary/Chest: Effort normal and breath sounds normal. No respiratory distress.  Abdominal: Soft. Normal appearance and bowel sounds are normal. She exhibits no distension. There is tenderness. There is no rebound and no guarding.    Nursing note and vitals reviewed.   ED Course  Procedures (including critical care time) Labs Review Labs Reviewed  CBC - Abnormal; Notable for the following:    Hemoglobin 10.4 (*)    HCT 32.0 (*)    RDW 16.3 (*)    All other components within normal limits  COMPREHENSIVE METABOLIC PANEL - Abnormal; Notable for the following:    Glucose, Bld  109 (*)    GFR calc non Af Amer 63 (*)    GFR calc Af Amer 73 (*)    All other components within normal limits  URINALYSIS, ROUTINE W REFLEX MICROSCOPIC - Abnormal; Notable for the following:    Ketones, ur 15 (*)    All other components within normal limits  POC URINE PREG, ED    Imaging Review US Transvaginal Non-ob  12/03/2014   CLINICAL DATA:  44 year old female with pelvic pain, right lower quadrant. Initial encounter. Personal history of ectopic pregnancy and bilateral tubal ligation. LMP 10/26/2014.  EXAM: TRANSABDOMINAL AND TRANSVAGINAL  ULTRASOUND OF PELVIS  DOPPLER ULTRASOUND OF OVARIES  TECHNIQUE: Both transabdominal and transvaginal ultrasound examinations of the pelvis were performed. Transabdominal technique was performed for global imaging of the pelvis including uterus, ovaries, adnexal regions, and pelvic cul-de-sac.  It was necessary to proceed with endovaginal exam following the transabdominal exam to visualize the ovaries. Color and duplex Doppler ultrasound was utilized to evaluate blood flow to the ovaries.  COMPARISON:  01/23/2012 ultrasound. CT Abdomen and Pelvis 08/14/2011.  FINDINGS: Uterus  Measurements: 10.1 x 6.6 x 7.7 cm. Mildly heterogeneous myometrium compatible with small fibroids, the largest 25 mm on image 32.  Endometrium  Thickness: 17 mm and mildly heterogeneous. However, no discrete mass is evident.  Right ovary  Measurements: 4.7 x 3.4 by up 4.2 cm. Anechoic cyst with a single septation measures up to 4.2 cm diameter (image 54). No solid or vascular elements (image 51 and 55).  Left ovary  Measurements: 3.4 x 2.1 x 3.7 cm. Anechoic cyst up to 2.8 cm.  Pulsed Doppler evaluation of both ovaries demonstrates normal low-resistance arterial and venous waveforms, but more challenging to detect on the right.  Other findings  Superior to the right ovary there is a tubular hypoechoic area encompassing 6.9 x 3.6 x 7.5 cm. Visually this appears similar to that demonstrated by a CT in 2012 (see coronal image 20 of that exam). According to the technologist this was discovered in scanning the patient reported area of pain. No surrounding hypervascularity is evident.  IMPRESSION: 1. Abnormal hypoechoic and tubular appearing area superior to the right ovary measuring up to 7.5 cm. This appears very similar to the CT Abdomen and Pelvis finding demonstrated in 2012, and I favor chronic hydrosalpinx. 2. No evidence of ovarian torsion. 3. Bilateral simple appearing ovarian cysts are demonstrated up to 4.2 cm diameter (no follow-up  necessary for simple simple cysts up to 5 cm in premenopausal patients). 4. Small uterine fibroids. 5. Thickened and mildly heterogeneous endometrium, but no discrete endometrial mass is evident with ultrasound. 6. Recommendations follow the consensus statement: Management of Asymptomatic Ovarian and Other Adnexal Cysts Imaged at [place]: Society of Radiologists in [place]. Radiology 2010; [phone].   Electronically Signed   By: Lars Pinks M.D.   On: 12/03/2014 15:56   US Pelvis Complete  12/03/2014   CLINICAL DATA:  44 year old female with pelvic pain, right lower quadrant. Initial encounter. Personal history of ectopic pregnancy and bilateral tubal ligation. LMP 10/26/2014.  EXAM: TRANSABDOMINAL AND TRANSVAGINAL ULTRASOUND OF PELVIS  DOPPLER ULTRASOUND OF OVARIES  TECHNIQUE: Both transabdominal and transvaginal ultrasound examinations of the pelvis were performed. Transabdominal technique was performed for global imaging of the pelvis including uterus, ovaries, adnexal regions, and pelvic cul-de-sac.  It was necessary to proceed with endovaginal exam following the transabdominal exam to visualize the ovaries. Color and duplex Doppler ultrasound was utilized to evaluate blood flow  to the ovaries.  COMPARISON:  01/23/2012 ultrasound. CT Abdomen and Pelvis 08/14/2011.  FINDINGS: Uterus  Measurements: 10.1 x 6.6 x 7.7 cm. Mildly heterogeneous myometrium compatible with small fibroids, the largest 25 mm on image 32.  Endometrium  Thickness: 17 mm and mildly heterogeneous. However, no discrete mass is evident.  Right ovary  Measurements: 4.7 x 3.4 by up 4.2 cm. Anechoic cyst with a single septation measures up to 4.2 cm diameter (image 54). No solid or vascular elements (image 51 and 55).  Left ovary  Measurements: 3.4 x 2.1 x 3.7 cm. Anechoic cyst up to 2.8 cm.  Pulsed Doppler evaluation of both ovaries demonstrates normal low-resistance arterial and venous waveforms, but more  challenging to detect on the right.  Other findings  Superior to the right ovary there is a tubular hypoechoic area encompassing 6.9 x 3.6 x 7.5 cm. Visually this appears similar to that demonstrated by a CT in 2012 (see coronal image 20 of that exam). According to the technologist this was discovered in scanning the patient reported area of pain. No surrounding hypervascularity is evident.  IMPRESSION: 1. Abnormal hypoechoic and tubular appearing area superior to the right ovary measuring up to 7.5 cm. This appears very similar to the CT Abdomen and Pelvis finding demonstrated in 2012, and I favor chronic hydrosalpinx. 2. No evidence of ovarian torsion. 3. Bilateral simple appearing ovarian cysts are demonstrated up to 4.2 cm diameter (no follow-up necessary for simple simple cysts up to 5 cm in premenopausal patients). 4. Small uterine fibroids. 5. Thickened and mildly heterogeneous endometrium, but no discrete endometrial mass is evident with ultrasound. 6. Recommendations follow the consensus statement: Management of Asymptomatic Ovarian and Other Adnexal Cysts Imaged at [place]: Society of Radiologists in [place]. Radiology 2010; [phone].   Electronically Signed   By: Lars Pinks M.D.   On: 12/03/2014 15:56   [place] Art/ven Flow Abd Pelv Doppler  12/03/2014   CLINICAL DATA:  44 year old female with pelvic pain, right lower quadrant. Initial encounter. Personal history of ectopic pregnancy and bilateral tubal ligation. LMP 10/26/2014.  EXAM: TRANSABDOMINAL AND TRANSVAGINAL ULTRASOUND OF PELVIS  DOPPLER ULTRASOUND OF OVARIES  TECHNIQUE: Both transabdominal and transvaginal ultrasound examinations of the pelvis were performed. Transabdominal technique was performed for global imaging of the pelvis including uterus, ovaries, adnexal regions, and pelvic cul-de-sac.  It was necessary to proceed with endovaginal exam following the transabdominal exam to visualize the ovaries. Color and  duplex Doppler ultrasound was utilized to evaluate blood flow to the ovaries.  COMPARISON:  01/23/2012 ultrasound. CT Abdomen and Pelvis 08/14/2011.  FINDINGS: Uterus  Measurements: 10.1 x 6.6 x 7.7 cm. Mildly heterogeneous myometrium compatible with small fibroids, the largest 25 mm on image 32.  Endometrium  Thickness: 17 mm and mildly heterogeneous. However, no discrete mass is evident.  Right ovary  Measurements: 4.7 x 3.4 by up 4.2 cm. Anechoic cyst with a single septation measures up to 4.2 cm diameter (image 54). No solid or vascular elements (image 51 and 55).  Left ovary  Measurements: 3.4 x 2.1 x 3.7 cm. Anechoic cyst up to 2.8 cm.  Pulsed Doppler evaluation of both ovaries demonstrates normal low-resistance arterial and venous waveforms, but more challenging to detect on the right.  Other findings  Superior to the right ovary there is a tubular hypoechoic area encompassing 6.9 x 3.6 x 7.5 cm. Visually this appears similar to that demonstrated by a CT in 2012 (see coronal image 20 of that exam).  According to the technologist this was discovered in scanning the patient reported area of pain. No surrounding hypervascularity is evident.  IMPRESSION: 1. Abnormal hypoechoic and tubular appearing area superior to the right ovary measuring up to 7.5 cm. This appears very similar to the CT Abdomen and Pelvis finding demonstrated in 2012, and I favor chronic hydrosalpinx. 2. No evidence of ovarian torsion. 3. Bilateral simple appearing ovarian cysts are demonstrated up to 4.2 cm diameter (no follow-up necessary for simple simple cysts up to 5 cm in premenopausal patients). 4. Small uterine fibroids. 5. Thickened and mildly heterogeneous endometrium, but no discrete endometrial mass is evident with ultrasound. 6. Recommendations follow the consensus statement: Management of Asymptomatic Ovarian and Other Adnexal Cysts Imaged at [place]: Society of Radiologists in [place]. Radiology 2010;  [phone].   Electronically Signed   By: Lars Pinks M.D.   On: 12/03/2014 15:56    Patient will be asked to follow-up with GYN.  Told to return here as needed.  MDM   Final diagnoses:  Pelvic pain in female        Brent General, PA-C 12/21/14 [place], MD 12/22/14 [phone]

## 2014-12-03 NOTE — ED Notes (Signed)
Patient returned from Ultrasound. 

## 2014-12-03 NOTE — ED Notes (Signed)
Patient returned from CT

## 2014-12-03 NOTE — ED Notes (Signed)
Patient transported to CT 

## 2014-12-03 NOTE — ED Notes (Signed)
Remains in U/S at this time

## 2014-12-03 NOTE — ED Notes (Signed)
She c/o sudden onset R pelvic pain this am. It feels liek when she had an ectopic pregnancy in the past, she had a partial hysterectomy for that ectopic pregnancy. She is late for her menstrual cycle this month , last sexual intercourse was in October and she has had normal menstrual cycle since October until this month. She had spotting last night. She took 5 ibuprofen with no relief and the pain shoots down R leg and into back. She has been nauseated also

## 2014-12-03 NOTE — ED Notes (Signed)
Patient transported to Ultrasound 

## 2014-12-03 NOTE — Discharge Instructions (Signed)
Cellulitis Take Tylenol for mild pain or the pain medicine prescribed for bad pain. Take the antibiotic as prescribed . Hold a warm clean washcloth over your left ear 4 times daily for 30 minutes at a time. Get your ear rechecked at an urgent care center or primary care physician in the next 3 or 4 days. Call any of the numbers on the resource guide to get a primary care physician. Call Dr. Marcheta Grammes office this week to schedule next available appointment Cellulitis is an infection of the skin and the tissue under the skin. The infected area is usually red and tender. This happens most often in the arms and lower legs. HOME CARE   Take your antibiotic medicine as told. Finish the medicine even if you start to feel better.  Keep the infected arm or leg raised (elevated).  Put a warm cloth on the area up to 4 times per day.  Only take medicines as told by your doctor.  Keep all doctor visits as told. GET HELP IF:  You see red streaks on the skin coming from the infected area.  Your red area gets bigger or turns a dark color.  Your bone or joint under the infected area is painful after the skin heals.  Your infection comes back in the same area or different area.  You have a puffy (swollen) bump in the infected area.  You have new symptoms.  You have a fever. GET HELP RIGHT AWAY IF:   You feel very sleepy.  You throw up (vomit) or have watery poop (diarrhea).  You feel sick and have muscle aches and pains. MAKE SURE YOU:   Understand these instructions.  Will watch your condition.  Will get help right away if you are not doing well or get worse. Document Released: 04/21/2008 Document Revised: 03/20/2014 Document Reviewed: 01/19/2012 [facility] Patient Information 2015 [place], [place]. This information is not intended to replace advice given to you by your health care provider. Make sure you discuss any questions you have with your health care provider. Pelvic Pain Female  pelvic pain can be caused by many different things and start from a variety of places. Pelvic pain refers to pain that is located in the lower half of the abdomen and between your hips. The pain may occur over a short period of time (acute) or may be reoccurring (chronic). The cause of pelvic pain may be related to disorders affecting the female reproductive organs (gynecologic), but it may also be related to the bladder, kidney stones, an intestinal complication, or muscle or skeletal problems. Getting help right away for pelvic pain is important, especially if there has been severe, sharp, or a sudden onset of unusual pain. It is also important to get help right away because some types of pelvic pain can be life threatening.  CAUSES  Below are only some of the causes of pelvic pain. The causes of pelvic pain can be in one of several categories.   Gynecologic.  Pelvic inflammatory disease.  Sexually transmitted infection.  Ovarian cyst or a twisted ovarian ligament (ovarian torsion).  Uterine lining that grows outside the uterus (endometriosis).  Fibroids, cysts, or tumors.  Ovulation.  Pregnancy.  Pregnancy that occurs outside the uterus (ectopic pregnancy).  Miscarriage.  Labor.  Abruption of the placenta or ruptured uterus.  Infection.  Uterine infection (endometritis).  Bladder infection.  Diverticulitis.  Miscarriage related to a uterine infection (septic abortion).  Bladder.  Inflammation of the bladder (cystitis).  Kidney stone(s).  Gastrointestinal.  Constipation.  Diverticulitis.  Neurologic.  Trauma.  Feeling pelvic pain because of mental or emotional causes (psychosomatic).  Cancers of the bowel or pelvis. EVALUATION  Your caregiver will want to take a careful history of your concerns. This includes recent changes in your health, a careful gynecologic history of your periods (menses), and a sexual history. Obtaining your family history and medical  history is also important. Your caregiver may suggest a pelvic exam. A pelvic exam will help identify the location and severity of the pain. It also helps in the evaluation of which organ system may be involved. In order to identify the cause of the pelvic pain and be properly treated, your caregiver may order tests. These tests may include:   A pregnancy test.  Pelvic ultrasonography.  An X-ray exam of the abdomen.  A urinalysis or evaluation of vaginal discharge.  Blood tests. HOME CARE INSTRUCTIONS   Only take over-the-counter or prescription medicines for pain, discomfort, or fever as directed by your caregiver.   Rest as directed by your caregiver.   Eat a balanced diet.   Drink enough fluids to make your urine clear or pale yellow, or as directed.   Avoid sexual intercourse if it causes pain.   Apply warm or cold compresses to the lower abdomen depending on which one helps the pain.   Avoid stressful situations.   Keep a journal of your pelvic pain. Write down when it started, where the pain is located, and if there are things that seem to be associated with the pain, such as food or your menstrual cycle.  Follow up with your caregiver as directed.  SEEK MEDICAL CARE IF:  Your medicine does not help your pain.  You have abnormal vaginal discharge. SEEK IMMEDIATE MEDICAL CARE IF:   You have heavy bleeding from the vagina.   Your pelvic pain increases.   You feel light-headed or faint.   You have chills.   You have pain with urination or blood in your urine.   You have uncontrolled diarrhea or vomiting.   You have a fever or persistent symptoms for more than 3 days.  You have a fever and your symptoms suddenly get worse.   You are being physically or sexually abused.  MAKE SURE YOU:  Understand these instructions.  Will watch your condition.  Will get help if you are not doing well or get worse. Document Released: 09/30/2004 Document  Revised: 03/20/2014 Document Reviewed: 02/23/2012 [facility] Patient Information 2015 [place], [place]. This information is not intended to replace advice given to you by your health care provider. Make sure you discuss any questions you have with your health care provider.  Emergency Department Resource Guide 1) Find a Doctor and Pay Out of Pocket Although you won't have to find out who is covered by your insurance plan, it is a good idea to ask around and get recommendations. You will then need to call the office and see if the doctor you have chosen will accept you as a new patient and what types of options they offer for patients who are self-pay. Some doctors offer discounts or will set up payment plans for their patients who do not have insurance, but you will need to ask so you aren't surprised when you get to your appointment.  2) Contact Your Local Health Department Not all health departments have doctors that can see patients for sick visits, but many do, so it is worth a call to see if yours  does. If you don't know where your local health department is, you can check in your phone book. The CDC also has a tool to help you locate your state's health department, and many state websites also have listings of all of their local health departments.  3) Find a [facility] If your illness is not likely to be very severe or complicated, you may want to try a walk in clinic. These are popping up all over the country in pharmacies, drugstores, and shopping centers. They're usually staffed by nurse practitioners or physician assistants that have been trained to treat common illnesses and complaints. They're usually fairly quick and inexpensive. However, if you have serious medical issues or chronic medical problems, these are probably not your best option.  No Primary Care Doctor: - Call Health Connect at  [phone] - they can help you locate a primary care doctor that  accepts your insurance, provides  certain services, etc. - Physician Referral Service- [phone]  Chronic Pain Problems: Organization         Address  Phone   Notes  [facility]  [phone] Patients need to be referred by their primary care doctor.   Medication Assistance: Organization         Address  Phone   Notes  [facility] Medication [facility] [address] [place], [place] [address] [phone] --Must be a resident of [facility] -- Must have NO insurance coverage whatsoever (no Medicaid/ Medicare, etc.) -- The pt. MUST have a primary care doctor that directs their care regularly and follows them in the community   Med[facility]ist  [phone]   [employer]  [phone]    Agencies that provide inexpensive medical care: Organization         Address  Phone   Notes  [place]  [phone]   [facility]    [phone]   [facility] [place], [place] [address] [phone]   [place] [address] [place] [phone]   Planned Parenthood    [phone]   [facility]    [phone]   [place] and [address], [place] Phone:  [phone], Fax:  [phone] Hours of Operation:  9 am - 6 pm, M-F.  Also accepts Medicaid/Medicare and self-pay.  [facility] [place], [address], [place] Phone: [phone], Fax: [phone]. Hours of Operation:  8:30 am - 5:30 pm, M-F.  Also accepts Medicaid and self-pay.  [facility] [address] [place] Phone: [phone]   [place], [place], [place] [phone], Ext. [phone] Mondays & Thursdays: 7-9 AM.  First 15 patients are seen on a first come, first serve basis.    [place] Providers:  Organization         Address  Phone   Notes  [facility] [address], [place] [phone] Also accepts self-pay patients.  [place], [place]  [phone]   [place], [address], [place] [phone]   [facility] [address], [place] [phone]   Lucianne Lei [address], [place]   [phone]  Only accepts [place] patients after they have their name applied to their card.   Self-Pay (no insurance) in [facility]:  Organization         Address  Phone   Notes  Sickle Cell Patients, [facility] [place] [phone]   [facility] [place] [phone]   [facility] [place]  [place], [place], [place] [phone]   [facility]/Dr. Osei-Bonsu  [address], [place] or [address], [place] [phone] Phone number for both [place] and [place] locations is the same.  [facility] [address] [place] [phone]   [facility] [address], [place] or [address] [phone]   [facility] [address], [place] [phone], phone; [phone], fax Sees patients 1st and 3rd Saturday of every month.  Must not qualify for public or private insurance (i.e. Medicaid, Medicare, [place] Health Choice, Veterans' Benefits)  Household income should be no more than 200% of the poverty level The clinic cannot treat you if you are pregnant or think you are pregnant  Sexually transmitted diseases are not treated at the clinic.    Dental Care: Organization         Address  Phone  Notes  [facility] of [facility] [place] [phone]  Accepts children up to age 75 who are enrolled in [place] or [place]; pregnant women with a Medicaid card; and children who have applied for Medicaid or [place] Health Choice, but were declined, whose parents can pay a reduced fee at time of service.  [facility] Department of [facility]  [address], [place] [phone] Accepts children up to age 36 who are enrolled in [place] or [place]; pregnant women with a Medicaid card; and children who have applied for Medicaid or [place] Health Choice, but were declined, whose parents can pay a reduced fee at time of service.  [place] Adult [facility] Access PRO[facility]  [place] [phone] Patients are seen by appointment only. Walk-ins are not accepted. [place] will see patients 43 years of age and older. Monday - Tuesday (8am-5pm) Most Wednesdays (8:30-5pm) $30 per visit, cash only  [facility]  [address], [facility] [phone] Patients are seen by appointment only. Walk-ins are not accepted. [place] will see patients 30 years of age and older. One Wednesday Evening (Monthly: Volunteer Based).  $30 per visit, cash only  [place]  [phone] for adults; Children under age 64, call Graduate Pediatric Dentistry at [phone]. Children aged 105-14, please call [phone] to request a pediatric application.  Dental services are provided in all areas of dental care including fillings, crowns and bridges, complete and partial dentures, implants, gum treatment, root canals, and extractions. Preventive care is also provided. Treatment is provided to both adults and children. Patients are selected via a lottery and there is often a waiting list.   [facility] [address] [place]  [phone] [email or web address], [place], [place] [phone], Ext. [phone] Second  and Fourth Thursday of each month, opens at 6:30 AM; Clinic  ends at 9 AM.  Patients are seen on a first-come first-served basis, and a limited number are seen during each clinic.   [facility]  [address] Hillard Danker [place], [place] [phone]   Eligibility Requirements You must have lived in [place], [place], or [place] counties for at least the last three months.   You cannot be eligible for state or federal sponsored [employer], including [employer], [place], or [employer].   You generally cannot be eligible for healthcare insurance through your employer.    How to apply: Eligibility screenings are held every Tuesday and Wednesday afternoon from 1:00 pm until 4:00 pm. You do not need an appointment for the interview!  [facility] [address] [place], [place]   [place]  [place] Department  [place]  [phone]    Behavioral Health Resources in the Community: Intensive Outpatient Programs Organization         Address  Phone  Notes  [place]. [address] [place], [place] [phone]   [facility] Outpatient [address] [place], [place]   ADS: [facility] [address], [place], [place]   [place] [address],  [place], [place] or [phone]   Substance Abuse Resources Organization         Address  Phone  Notes  Alcohol and Drug Services  [phone]   [place]  [phone]   [facility] [place]   Chinita Pester  [phone]   Residential & Outpatient Substance Abuse Program  [phone]   Psychological Services Organization         Address  Phone  Notes  [facility] [place]  [place]  [phone]   [place] [address], [place] or [phone]    Mobile Crisis Teams Organization         Address  Phone  Notes  [facility], Mobile Crisis Care Unit  [phone]   [facility] Psychotherapeutic Services  [address] [place], [place]   [place] Levels [address] [place] [phone]    Self-Help/Support Groups Organization         Address  Phone             Notes  [place]. of [place] - variety of support groups  [place] Call for more information  Narcotics Anonymous (NA), [facility] [address], [employer] [place]  2 meetings at this location   Special educational needs teacher         Address  Phone  Notes  [facility] Residential Treatment [place],    [place]  [phone]   [facility]  [address] [place] [id number], [place], [place]   [place], [place] [phone] Admissions: 8am-3pm M-F  Incentives Substance [place] [address],    [place], [place] [phone]   [facility] [address] Jadene Pierini [place], [place]   [facility] [address],  [place], [place]   [facility] - Intensive Outpatient [address] Kristeen [address], [place], [place]   [facility] [address] Madelaine Bhat  [place], [place] or [phone]   Residential  Treatment Services (RTS) [address] [place], [place] Accepts Medicaid  [place] [address],  [place] [phone] Substance Abuse/Addiction Treatment   [facility] Organization         Address  Phone  Notes  [facility]  [phone]   Domenic Schwab, PhD [address] Arlis Porta [place], [place]   [phone] or [phone]   [place], [place]  [phone]   [address], [place], [place] [phone] Insurance/Medicaid/sponsorship through [facility] and Families [address], [address] [place]                                    [place], [place] [phone] [place] [address], [place] [phone]    Dr. Adele Schilder  [phone]   [facility] Dept. 1) [address], [place] 2) Advance 3)  [place] [address], [place] [phone]  [phone]   [place] [phone] or [phone] (After Hours)

## 2014-12-03 NOTE — ED Notes (Signed)
[employer] notified to send UA to main lab.

## 2014-12-03 NOTE — ED Provider Notes (Signed)
Presents with right lower quadrant pain gradual in onset this morning 10 AM pain is crampy originally felt like menstrual cramps became more severe as the day went on. She had similar pain in the past several years ago after she had right salpingectomy for ectopic pregnancy ago. She denies urinary symptoms. She admits to diminished appetite. She also  reports pain around her left ear for the past 2 days with slight redness at her left external ear has been treated in the past with Neosporin resolve spontaneously On exam alert Glasgow Coma Score 15 on exam patient is alert nontoxic no distress HEENT exam left external ear minimally reddened posteriorly and minimal tenderness. No swelling. Neck supple no adenopathy lungs clear auscultation abdomen obese normal bowel sounds are tender at right lower quadrant 1:20 PM continues to complain of pain at right lower quadrant after treatment with intravenous opioids Norco ordered. I discussed radiologic findings and lab data with patient Plan prescription Norco, also to prescribe Keflex for cellulitic area on left ear. Warm compresses. Follow-up Dr. Ruthann Cancer. Also referred to resource guide to get primary care physician diagnosis #1 pelvic pain Results for orders placed or performed during the hospital encounter of 12/03/14  Wet prep, genital  Result Value Ref Range   Yeast Wet Prep HPF POC NONE SEEN NONE SEEN   Trich, Wet Prep NONE SEEN NONE SEEN   Clue Cells Wet Prep HPF POC FEW (A) NONE SEEN   WBC, Wet Prep HPF POC FEW (A) NONE SEEN  CBC (if pt has a temp above 100.32F)  Result Value Ref Range   WBC 8.1 4.0 - 10.5 K/uL   RBC 3.87 3.87 - 5.11 MIL/uL   Hemoglobin 10.4 (L) 12.0 - 15.0 g/dL   HCT 32.0 (L) 36.0 - 46.0 %   MCV 82.7 78.0 - 100.0 fL   MCH 26.9 26.0 - 34.0 pg   MCHC 32.5 30.0 - 36.0 g/dL   RDW 16.3 (H) 11.5 - 15.5 %   Platelets 351 150 - 400 K/uL  Comprehensive metabolic panel (if pt has a temp above 100.32F)  Result Value Ref Range   Sodium  138 135 - 145 mmol/L   Potassium 3.9 3.5 - 5.1 mmol/L   Chloride 106 96 - 112 mEq/L   CO2 22 19 - 32 mmol/L   Glucose, Bld 109 (H) 70 - 99 mg/dL   BUN 7 6 - 23 mg/dL   Creatinine, Ser 1.06 0.50 - 1.10 mg/dL   Calcium 9.0 8.4 - 10.5 mg/dL   Total Protein 7.4 6.0 - 8.3 g/dL   Albumin 4.0 3.5 - 5.2 g/dL   AST 24 0 - 37 U/L   ALT 14 0 - 35 U/L   Alkaline Phosphatase 80 39 - 117 U/L   Total Bilirubin 1.1 0.3 - 1.2 mg/dL   GFR calc non Af Amer 63 (L) >90 mL/min   GFR calc Af Amer 73 (L) >90 mL/min   Anion gap 10 5 - 15  Urinalysis, Routine w reflex microscopic  Result Value Ref Range   Color, Urine YELLOW YELLOW   APPearance CLEAR CLEAR   Specific Gravity, Urine 1.024 1.005 - 1.030   pH 5.5 5.0 - 8.0   Glucose, UA NEGATIVE NEGATIVE mg/dL   Hgb urine dipstick NEGATIVE NEGATIVE   Bilirubin Urine NEGATIVE NEGATIVE   Ketones, ur 15 (A) NEGATIVE mg/dL   Protein, ur NEGATIVE NEGATIVE mg/dL   Urobilinogen, UA 0.2 0.0 - 1.0 mg/dL   Nitrite NEGATIVE NEGATIVE  Leukocytes, UA NEGATIVE NEGATIVE  POC Urine Pregnancy, ED (if pre-menopausal female) - do NOT order at [facility]  Result Value Ref Range   Preg Test, Ur NEGATIVE NEGATIVE   US Transvaginal Non-ob  12/03/2014   CLINICAL DATA:  44 year old female with pelvic pain, right lower quadrant. Initial encounter. Personal history of ectopic pregnancy and bilateral tubal ligation. LMP 10/26/2014.  EXAM: TRANSABDOMINAL AND TRANSVAGINAL ULTRASOUND OF PELVIS  DOPPLER ULTRASOUND OF OVARIES  TECHNIQUE: Both transabdominal and transvaginal ultrasound examinations of the pelvis were performed. Transabdominal technique was performed for global imaging of the pelvis including uterus, ovaries, adnexal regions, and pelvic cul-de-sac.  It was necessary to proceed with endovaginal exam following the transabdominal exam to visualize the ovaries. Color and duplex Doppler ultrasound was utilized to evaluate blood flow to the ovaries.  COMPARISON:  01/23/2012 ultrasound. CT  Abdomen and Pelvis 08/14/2011.  FINDINGS: Uterus  Measurements: 10.1 x 6.6 x 7.7 cm. Mildly heterogeneous myometrium compatible with small fibroids, the largest 25 mm on image 32.  Endometrium  Thickness: 17 mm and mildly heterogeneous. However, no discrete mass is evident.  Right ovary  Measurements: 4.7 x 3.4 by up 4.2 cm. Anechoic cyst with a single septation measures up to 4.2 cm diameter (image 54). No solid or vascular elements (image 51 and 55).  Left ovary  Measurements: 3.4 x 2.1 x 3.7 cm. Anechoic cyst up to 2.8 cm.  Pulsed Doppler evaluation of both ovaries demonstrates normal low-resistance arterial and venous waveforms, but more challenging to detect on the right.  Other findings  Superior to the right ovary there is a tubular hypoechoic area encompassing 6.9 x 3.6 x 7.5 cm. Visually this appears similar to that demonstrated by a CT in 2012 (see coronal image 20 of that exam). According to the technologist this was discovered in scanning the patient reported area of pain. No surrounding hypervascularity is evident.  IMPRESSION: 1. Abnormal hypoechoic and tubular appearing area superior to the right ovary measuring up to 7.5 cm. This appears very similar to the CT Abdomen and Pelvis finding demonstrated in 2012, and I favor chronic hydrosalpinx. 2. No evidence of ovarian torsion. 3. Bilateral simple appearing ovarian cysts are demonstrated up to 4.2 cm diameter (no follow-up necessary for simple simple cysts up to 5 cm in premenopausal patients). 4. Small uterine fibroids. 5. Thickened and mildly heterogeneous endometrium, but no discrete endometrial mass is evident with ultrasound. 6. Recommendations follow the consensus statement: Management of Asymptomatic Ovarian and Other Adnexal Cysts Imaged at [place]: Society of Radiologists in [place]. Radiology 2010; [phone].   Electronically Signed   By: Lars Pinks M.D.   On: 12/03/2014 15:56   US Pelvis Complete  12/03/2014    CLINICAL DATA:  44 year old female with pelvic pain, right lower quadrant. Initial encounter. Personal history of ectopic pregnancy and bilateral tubal ligation. LMP 10/26/2014.  EXAM: TRANSABDOMINAL AND TRANSVAGINAL ULTRASOUND OF PELVIS  DOPPLER ULTRASOUND OF OVARIES  TECHNIQUE: Both transabdominal and transvaginal ultrasound examinations of the pelvis were performed. Transabdominal technique was performed for global imaging of the pelvis including uterus, ovaries, adnexal regions, and pelvic cul-de-sac.  It was necessary to proceed with endovaginal exam following the transabdominal exam to visualize the ovaries. Color and duplex Doppler ultrasound was utilized to evaluate blood flow to the ovaries.  COMPARISON:  01/23/2012 ultrasound. CT Abdomen and Pelvis 08/14/2011.  FINDINGS: Uterus  Measurements: 10.1 x 6.6 x 7.7 cm. Mildly heterogeneous myometrium compatible with small fibroids, the largest 25 mm on  image 32.  Endometrium  Thickness: 17 mm and mildly heterogeneous. However, no discrete mass is evident.  Right ovary  Measurements: 4.7 x 3.4 by up 4.2 cm. Anechoic cyst with a single septation measures up to 4.2 cm diameter (image 54). No solid or vascular elements (image 51 and 55).  Left ovary  Measurements: 3.4 x 2.1 x 3.7 cm. Anechoic cyst up to 2.8 cm.  Pulsed Doppler evaluation of both ovaries demonstrates normal low-resistance arterial and venous waveforms, but more challenging to detect on the right.  Other findings  Superior to the right ovary there is a tubular hypoechoic area encompassing 6.9 x 3.6 x 7.5 cm. Visually this appears similar to that demonstrated by a CT in 2012 (see coronal image 20 of that exam). According to the technologist this was discovered in scanning the patient reported area of pain. No surrounding hypervascularity is evident.  IMPRESSION: 1. Abnormal hypoechoic and tubular appearing area superior to the right ovary measuring up to 7.5 cm. This appears very similar to the CT  Abdomen and Pelvis finding demonstrated in 2012, and I favor chronic hydrosalpinx. 2. No evidence of ovarian torsion. 3. Bilateral simple appearing ovarian cysts are demonstrated up to 4.2 cm diameter (no follow-up necessary for simple simple cysts up to 5 cm in premenopausal patients). 4. Small uterine fibroids. 5. Thickened and mildly heterogeneous endometrium, but no discrete endometrial mass is evident with ultrasound. 6. Recommendations follow the consensus statement: Management of Asymptomatic Ovarian and Other Adnexal Cysts Imaged at [place]: Society of Radiologists in [place]. Radiology 2010; [phone].   Electronically Signed   By: Lars Pinks M.D.   On: 12/03/2014 15:56   Ct Abdomen Pelvis W Contrast  12/03/2014   CLINICAL DATA:  Right lower quadrant pelvic pain extending into the right side of the back and lower extremity. The patient states this pain is similar to pain she had with a previous ectopic pregnancy. Her menstrual cycle is late. Urine pregnancy test is negative. Initial encounter.  EXAM: CT ABDOMEN AND PELVIS WITH CONTRAST  TECHNIQUE: Multidetector CT imaging of the abdomen and pelvis was performed using the standard protocol following bolus administration of intravenous contrast.  CONTRAST:  191m OMNIPAQUE IOHEXOL 300 MG/ML  SOLN  COMPARISON:  Pelvic ultrasound from the same day. CT abdomen and pelvis 08/14/2011  FINDINGS: Mild dependent atelectasis is present bilaterally. Heart size is normal. No significant pleural or pericardial effusion is present.  The liver and spleen are within normal limits. The stomach, duodenum, and pancreas are within normal limits. The common bile duct is within normal limits following cholecystectomy.  The adrenal glands are normal bilaterally. Cystic lesions in both kidneys are stable. No new lesions are present. There are no stones. The ureters are within normal limits.  The rectus sigmoid colon is within normal limits.  Scattered diverticula are present within the sigmoid colon. There is no inflammation to suggest diverticulitis. The more proximal colon is within normal limits. The appendix is visualized and normal. Small bowel is unremarkable.  Uterus is somewhat heterogeneous and deviates to the right, similar to the prior study. Cystic changes of both ovaries are again noted. The largest cyst is in the right ovary measuring up to 4.6 cm. A tubular structure adjacent to the right adnexa similar the prior CT scan and corresponds with the tubular lesion evident at ultrasound. There is no significant free fluid or adenopathy. Urinary bladder is within normal limits.  The bone windows demonstrate mild facet degenerative changes  in the lower lumbar spine. No focal lytic or blastic lesions are evident.  IMPRESSION: 1. Stable appearance of tubular structure lateral to the right and next at measuring 3.3 cm in short access in compatible with a chronic hydrosalpinx. 2. Additional cystic changes within the adnexa bilaterally are slightly larger than on the prior study. By ultrasound these were simple cysts. 3. Heterogeneous appearance of the uterus compatible with fibroids. 4. Normal appearance the appendix in colon. 5. No other acute or focal lesion to explain the patient's symptoms. 6. Mild facet degenerative changes in the lower lumbar spine.   Electronically Signed   By: Lawrence Santiago M.D.   On: 12/03/2014 17:58   [place] Art/ven Flow Abd Pelv Doppler  12/03/2014   CLINICAL DATA:  44 year old female with pelvic pain, right lower quadrant. Initial encounter. Personal history of ectopic pregnancy and bilateral tubal ligation. LMP 10/26/2014.  EXAM: TRANSABDOMINAL AND TRANSVAGINAL ULTRASOUND OF PELVIS  DOPPLER ULTRASOUND OF OVARIES  TECHNIQUE: Both transabdominal and transvaginal ultrasound examinations of the pelvis were performed. Transabdominal technique was performed for global imaging of the pelvis including uterus, ovaries, adnexal  regions, and pelvic cul-de-sac.  It was necessary to proceed with endovaginal exam following the transabdominal exam to visualize the ovaries. Color and duplex Doppler ultrasound was utilized to evaluate blood flow to the ovaries.  COMPARISON:  01/23/2012 ultrasound. CT Abdomen and Pelvis 08/14/2011.  FINDINGS: Uterus  Measurements: 10.1 x 6.6 x 7.7 cm. Mildly heterogeneous myometrium compatible with small fibroids, the largest 25 mm on image 32.  Endometrium  Thickness: 17 mm and mildly heterogeneous. However, no discrete mass is evident.  Right ovary  Measurements: 4.7 x 3.4 by up 4.2 cm. Anechoic cyst with a single septation measures up to 4.2 cm diameter (image 54). No solid or vascular elements (image 51 and 55).  Left ovary  Measurements: 3.4 x 2.1 x 3.7 cm. Anechoic cyst up to 2.8 cm.  Pulsed Doppler evaluation of both ovaries demonstrates normal low-resistance arterial and venous waveforms, but more challenging to detect on the right.  Other findings  Superior to the right ovary there is a tubular hypoechoic area encompassing 6.9 x 3.6 x 7.5 cm. Visually this appears similar to that demonstrated by a CT in 2012 (see coronal image 20 of that exam). According to the technologist this was discovered in scanning the patient reported area of pain. No surrounding hypervascularity is evident.  IMPRESSION: 1. Abnormal hypoechoic and tubular appearing area superior to the right ovary measuring up to 7.5 cm. This appears very similar to the CT Abdomen and Pelvis finding demonstrated in 2012, and I favor chronic hydrosalpinx. 2. No evidence of ovarian torsion. 3. Bilateral simple appearing ovarian cysts are demonstrated up to 4.2 cm diameter (no follow-up necessary for simple simple cysts up to 5 cm in premenopausal patients). 4. Small uterine fibroids. 5. Thickened and mildly heterogeneous endometrium, but no discrete endometrial mass is evident with ultrasound. 6. Recommendations follow the consensus statement:  Management of Asymptomatic Ovarian and Other Adnexal Cysts Imaged at [place]: Society of Radiologists in [place]. Radiology 2010; [phone].   Electronically Signed   By: Lars Pinks M.D.   On: 12/03/2014 15:56   Dx #1 pelvic pain #2 anemia #3cellulitis of left ear  Orlie Dakin, MD 12/03/14 1935

## 2014-12-04 LAB — GC/CHLAMYDIA PROBE AMP (~~LOC~~) NOT AT ARMC
Chlamydia: NEGATIVE
Neisseria Gonorrhea: NEGATIVE

## 2014-12-06 NOTE — ED Provider Notes (Signed)
Medical screening examination/treatment/procedure(s) were conducted as a shared visit with non-physician practitioner(s) and myself.  I personally evaluated the patient during the encounter.   EKG Interpretation None       Orlie Dakin, MD 12/06/14 [phone]

## 2015-03-08 ENCOUNTER — Other Ambulatory Visit (HOSPITAL_COMMUNITY): Payer: Self-pay | Admitting: Obstetrics

## 2015-03-08 DIAGNOSIS — Z1231 Encounter for screening mammogram for malignant neoplasm of breast: Secondary | ICD-10-CM

## 2015-03-13 ENCOUNTER — Ambulatory Visit (HOSPITAL_COMMUNITY)
Admission: RE | Admit: 2015-03-13 | Discharge: 2015-03-13 | Disposition: A | Discharge status: Home / Self Care | Payer: Medicaid Other | Source: Ambulatory Visit | Attending: Obstetrics | Admitting: Obstetrics

## 2015-03-13 DIAGNOSIS — Z1231 Encounter for screening mammogram for malignant neoplasm of breast: Secondary | ICD-10-CM | POA: Diagnosis not present

## 2015-03-15 ENCOUNTER — Other Ambulatory Visit: Payer: Self-pay | Admitting: Obstetrics

## 2015-03-15 DIAGNOSIS — R928 Other abnormal and inconclusive findings on diagnostic imaging of breast: Secondary | ICD-10-CM

## 2015-03-18 HISTORY — PX: BREAST BIOPSY: SHX20

## 2015-04-04 ENCOUNTER — Other Ambulatory Visit: Payer: Self-pay | Admitting: Obstetrics

## 2015-04-04 ENCOUNTER — Ambulatory Visit
Admission: RE | Admit: 2015-04-04 | Discharge: 2015-04-04 | Disposition: A | Discharge status: Home / Self Care | Payer: Medicaid Other | Source: Ambulatory Visit | Attending: Obstetrics | Admitting: Obstetrics

## 2015-04-04 DIAGNOSIS — R928 Other abnormal and inconclusive findings on diagnostic imaging of breast: Secondary | ICD-10-CM

## 2015-04-04 DIAGNOSIS — N6320 Unspecified lump in the left breast, unspecified quadrant: Secondary | ICD-10-CM

## 2015-04-05 ENCOUNTER — Other Ambulatory Visit: Payer: Self-pay | Admitting: Obstetrics

## 2015-04-05 DIAGNOSIS — N6320 Unspecified lump in the left breast, unspecified quadrant: Secondary | ICD-10-CM

## 2015-04-11 ENCOUNTER — Ambulatory Visit
Admission: RE | Admit: 2015-04-11 | Discharge: 2015-04-11 | Disposition: A | Discharge status: Home / Self Care | Payer: Medicaid Other | Source: Ambulatory Visit | Attending: Obstetrics | Admitting: Obstetrics

## 2015-04-11 DIAGNOSIS — N6320 Unspecified lump in the left breast, unspecified quadrant: Secondary | ICD-10-CM

## 2015-04-17 ENCOUNTER — Other Ambulatory Visit: Payer: Self-pay | Admitting: Surgery

## 2015-04-20 ENCOUNTER — Telehealth: Payer: Self-pay | Admitting: *Deleted

## 2015-04-20 NOTE — Telephone Encounter (Signed)
Received appt date and time from Rhonda Moss.  Called pt and confirmed 04/23/15 genetic appt w/ her.  Emailed Engineer, civil (consulting) at [employer] to make her aware.

## 2015-04-22 ENCOUNTER — Other Ambulatory Visit: Payer: Self-pay | Admitting: Oncology

## 2015-04-23 ENCOUNTER — Other Ambulatory Visit: Payer: Medicaid Other

## 2015-04-23 ENCOUNTER — Ambulatory Visit (HOSPITAL_BASED_OUTPATIENT_CLINIC_OR_DEPARTMENT_OTHER): Payer: Medicaid Other | Admitting: Genetic Counselor

## 2015-04-23 ENCOUNTER — Encounter: Payer: Self-pay | Admitting: Genetic Counselor

## 2015-04-23 ENCOUNTER — Telehealth: Payer: Self-pay | Admitting: *Deleted

## 2015-04-23 DIAGNOSIS — Z803 Family history of malignant neoplasm of breast: Secondary | ICD-10-CM | POA: Diagnosis not present

## 2015-04-23 DIAGNOSIS — C50912 Malignant neoplasm of unspecified site of left female breast: Secondary | ICD-10-CM | POA: Diagnosis not present

## 2015-04-23 DIAGNOSIS — Z8042 Family history of malignant neoplasm of prostate: Secondary | ICD-10-CM | POA: Insufficient documentation

## 2015-04-23 NOTE — Telephone Encounter (Signed)
Received an email from Gaspar Bidding stating that she spoke w/ Rhonda Moss on 6/1 and pt is approved for 1 year and the NPI # [id number].  Could not place the referral due to no training.

## 2015-04-23 NOTE — Progress Notes (Signed)
REFERRING PROVIDER: Marco Collie. Ninfa Linden, [facility] [address] [place], [place] [address] Phone: [phone]  Lurline Del, MD  PRIMARY PROVIDER:  Gracy Racer, MD [address] [place], [place]  [address] Phone: [phone]  PRIMARY REASON FOR VISIT:  1. Breast cancer, left   2. Family history of breast cancer in mother   7. Family history of breast cancer in sister   72. Family history of breast cancer in female   25. Family history of prostate cancer      HISTORY OF PRESENT ILLNESS:   Rhonda Moss, a 44 y.o. female, was seen for a [facility] cancer genetics consultation at the request of Dr. Ninfa Linden due to a personal history of breast cancer at 37 and a family history of breast and prostate cancer.  Ms. Topete presents to clinic today to discuss the possibility of a hereditary predisposition to cancer, genetic testing, and to further clarify her future cancer risks, as well as potential cancer risks for family members.    In May 2016, at the age of 46, Ms. 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 Hormone receptor status was ER/PR+ and Her2-.  Ms. Hogge is awaiting an upcoming appointment on 6/13 with Dr. Jana Hakim to discuss treatment for her breast cancer.    CANCER HISTORY:   No history exists.  May 2016 - Dx. 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  First live birth at age 72.  OCP use for approximately 10 years.  Ovaries intact: yes.  Hysterectomy: no.  Menopausal status: premenopausal.  HRT use: 0 years. Colonoscopy: no; not examined. Mammogram within the last year: yes. Number of breast biopsies: 1. Up to date with pelvic exams:  yes. Any excessive radiation exposure in the past:  no  Past Medical History  Diagnosis Date  . Ectopic pregnancy    . Family history of breast cancer   . Family history of kidney cancer   . Colon polyps   . Breast cancer dx. 21    ILC of left breast    Past Surgical History  Procedure Laterality Date  . Lymphadenectomy  1992  . Tubal ligation  2009  . Cervical biopsy  w/ loop electrode excision      History   Social History  . Marital Status: Divorced    Spouse Name: N/A  . Number of Children: N/A  . Years of Education: N/A   Social History Main Topics  . Smoking status: Never Smoker   . Smokeless tobacco: Not on file  . Alcohol Use: Yes  . Drug Use: No  . Sexual Activity: Yes    Birth Control/ Protection: None   Other Topics Concern  . None   Social History Narrative     FAMILY HISTORY:  We obtained a detailed, 4-generation family history.  Significant diagnoses are listed below: Family History  Problem Relation Age of Onset  . Breast cancer Mother 42  . Breast cancer Sister 22  . Breast cancer Maternal Aunt 60    +calcifications  . Prostate cancer Maternal Uncle 67  . Heart attack Maternal Grandmother   . Heart attack Maternal Grandfather   . Prostate cancer Maternal Grandfather 3  . Diabetes Paternal Grandmother   . Pancreatitis Paternal Grandfather   . Prostate cancer Cousin  dx. late 4s    Patient's maternal ancestors are of African-American, Cherokee, and European Caucasian descent, and paternal ancestors are of [place] American and Caucasian descent. There no reported Ashkenazi Jewish ancestry. There no known consanguinity.  GENETIC COUNSELING ASSESSMENT: Rhonda Moss is a 44 y.o. female with a personal history of breast cancer and family history of breast and prostate cancer which somewhat suggestive of a hereditary breast cancer condtition and predisposition to cancer. We, therefore, discussed and recommended the following at today's visit.   DISCUSSION: We reviewed the characteristics, features and inheritance patterns of hereditary cancer syndromes. We  also discussed genetic testing, including the appropriate family members to test, the process of testing, insurance coverage and turn-around-time for results. We discussed the implications of a negative, positive and/or variant of uncertain significant result. We recommended [name] pursue genetic testing for the Breast/Ovarian Cancer gene panel (analysis of 21 genes) through [facility] Hope Pigeon, MD).  Since [name] has not undergone treatment or surgical management yet, we discussed her plans to use the results of this test for potentially making decisions regarding future treatment and surgery.  We will make the lab aware that [name] plans to use this information for decision-making, so that we will be able to receive these results more quickly.    Based on [name]'s personal and family history of cancer, she meets medical criteria for genetic testing. Despite that she meets criteria, she may still have an out of pocket cost. We discussed that if her out of pocket cost for testing is over $100, the laboratory will call and confirm whether she wants to proceed with testing.  If the out of pocket cost of testing is less than $100 she will be billed by the genetic testing laboratory.    PLAN: After considering the risks, benefits, and limitations, [name]  provided informed consent to pursue genetic testing and the blood sample was sent to [facility] for analysis of the Breast/Ovarian Cancer Panel (analysis of: ATM, BARD1, BRCA1, BRCA2, BRIP1, CDH1, CHEK2, EPCAM, FANCC, MLH1, MSH2, MSH6, NBN, PALB2, PMS2, PTEN, RAD51C, RAD51D, STK11, TP53, XRCC2). Results are generally available within 2-3 weeks time, however, because we are rushing these results, they should be available between 1-2 weeks' time, at which point they will be disclosed by telephone to [name], as will any additional recommendations warranted by these results. [name] will receive a summary  of her genetic counseling visit and a copy of her results once available. This information will also be available in Epic. We encouraged [name] to remain in contact with cancer genetics annually so that we can continuously update the family history and inform her of any changes in cancer genetics and testing that may be of benefit for her family. Ms. Tartaglia questions were answered to her satisfaction today. Our contact information was provided should additional questions or concerns arise.  Thank you for the referral and allowing [place] to share in the care of your patient.   Jeanine Luz, MS Genetic Counselor [email or web address] phone: [phone]  The patient was seen for a total of 60 minutes in face-to-face genetic counseling.  This patient was discussed with Drs. Magrinat, Lindi Adie and/or Burr Medico who agrees with the above.    _______________________________________________________________________ For Office Staff:  Number of people involved in session: 3 Was an Intern/ student involved with case: no

## 2015-04-23 NOTE — Telephone Encounter (Signed)
Pt returned my call and she cannot come in on 6/10 due to her son graduating.  Took paperwork back to Dr. Jana Hakim for another appt date and time.

## 2015-04-23 NOTE — Telephone Encounter (Signed)
Received appt from Dr. Jana Hakim.  Called pt and confirmed 04/30/15 appt w/ her.  Took before appt letter, calendar, welcoming packet, intake form and Alight bag up to Roma Kayser to give to pt.  Emailed Engineer, civil (consulting) at [employer] to make her aware.  Placed a copy of records in Dr [employer] box and took one to HIM to scan.

## 2015-04-27 ENCOUNTER — Other Ambulatory Visit: Payer: Self-pay

## 2015-04-27 DIAGNOSIS — Z803 Family history of malignant neoplasm of breast: Secondary | ICD-10-CM

## 2015-04-29 DIAGNOSIS — C50412 Malignant neoplasm of upper-outer quadrant of left female breast: Secondary | ICD-10-CM

## 2015-04-29 DIAGNOSIS — Z170 Estrogen receptor positive status [ER+]: Secondary | ICD-10-CM

## 2015-04-29 HISTORY — DX: Estrogen receptor positive status (ER+): C50.412

## 2015-04-29 HISTORY — DX: Estrogen receptor positive status (ER+): Z17.0

## 2015-04-29 NOTE — Progress Notes (Signed)
[place]  [phone] [phone]     ID: KIMBELLA HEISLER DOB: 05/16/1971  MR#: [id number]  GNF#:[id number]  Patient Care Team: Frederico Hamman, MD as PCP - General (Obstetrics and Gynecology) Chauncey Cruel, MD as Consulting Physician (Oncology) PCP: Frederico Hamman, MD GYN: SU: Coralie Keens, MD OTHER MD:   CHIEF COMPLAINT: Estrogen receptor positive left breast cancer  CURRENT TREATMENT: Considering neoadjuvant anti-estrogens   BREAST CANCER HISTORY: The patient had routine bilateral screening mammography at the breast center 03/13/2015 showing a possible mass in the left breast. Left diagnostic mammography with tomosynthesis and left breast ultrasonography 04/04/2015 found the breast density to be category C area did in the left breast upper outer quadrant there was a persistent mass which by ultrasound was irregular and hypoechoic. It measured 0.7 cm. In the left axilla there was a normal size lymph node with eccentric cortical thickening measuring 4 mm in maximal thickness.  On 04/11/2015 the patient underwent biopsy of the left breast mass in question and this showed (SAA [phone]) and invasive lobular carcinoma, E-cadherin negative, grade 1 or 2, estrogen receptor 99% positive, progesterone receptor 90% positive, both with strong staining intensity, with an MIB-1 of 19%. HER-2 was not amplified, the signals ratio being 1.09 and the number per cell 1.90.  The patient's case was presented at the multidisciplinary breast cancer conference 04/18/2015. At that time it was recommended that the patient be referred to genetics and undergo breast MRI.   Her subsequent history is as detailed below  INTERVAL HISTORY: Jimma was evaluated in the breast clinic 04/30/2015 accompanied by her sister Levada Dy and her significant other [name].  REVIEW OF SYSTEMS: There were no specific symptoms leading to the original mammogram, which was  routinely scheduled. The patient has occasional headaches butdenies unusual headaches, visual changes, nausea, vomiting, stiff neck, dizziness, or gait imbalance. There has been no cough, phlegm production, or pleurisy, no chest pain or pressure, and no change in bowel or bladder habits. The patient denies fever, rash, bleeding, unexplained fatigue or unexplained weight loss--she tells me she has lost about 100 pounds by exercising and dieting over the last 2 years. She recently started a running program. Sometimes she thinks she may have an irregular heartbeat. She admits to feeling anxious and 2 being forgetful. She denies depression.. A detailed review of systems was otherwise entirely negative.  PAST MEDICAL HISTORY: Past Medical History  Diagnosis Date  . Ectopic pregnancy   . Family history of breast cancer   . Family history of kidney cancer   . Colon polyps   . Breast cancer dx. 1    ILC of left breast    PAST SURGICAL HISTORY: Past Surgical History  Procedure Laterality Date  . Lymphadenectomy  1992  . Tubal ligation  2009  . Cervical biopsy  w/ loop electrode excision      FAMILY HISTORY Family History  Problem Relation Age of Onset  . Breast cancer Mother 54  . Breast cancer Sister 22  . Breast cancer Maternal Aunt 60    +calcifications  . Prostate cancer Maternal Uncle 67  . Heart attack Maternal Grandmother   . Heart attack Maternal Grandfather   . Prostate cancer Maternal Grandfather 37  . Diabetes Paternal Grandmother   . Pancreatitis Paternal Grandfather   . Prostate cancer Cousin     dx. late 73s  the patient's parents are living, in their late 36s. The patient has one brother, and one sister.  The patient's mother was diagnosed with breast cancer at age 2 and  One of the patient's mother's 2 sisters was also diagnosed with breast cancer at age 67. The patient's on sister was diagnosed with breast cancer at age 15. She was tested for the BRCA gene mutations and  was negative.  GYNECOLOGIC HISTORY:  No LMP recorded. Menarche age 11 first live birth age 57. The patient is GX P2. Her periods are regular, lasting between 5 and 7 days. They're now slightly better controlled on a NuvaRing IUD.   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 At home is just her and her son is a Coen, currently 71 years old. The patient's daughter Audrionna Lampton, 20, also sometimes stays at home. She is studying sports medicine. The patient's significant other, [name], a works for [employer].    ADVANCED DIRECTIVES: not in place; Not in place. At the initial clinic visit 04/30/2015 the patient was given the appropriate forms to complete and notarize at her discretion.  HEALTH MAINTENANCE: History  Substance Use Topics  . Smoking status: Never Smoker   . Smokeless tobacco: Not on file  . Alcohol Use: Yes     Colonoscopy:  PAP: NOV 2015  Bone density:  Lipid panel:  Allergies  Allergen Reactions  . Sulfa Antibiotics Hives    Current Outpatient Prescriptions  Medication Sig Dispense Refill  . cetirizine (ZYRTEC) 10 MG tablet Take 10 mg by mouth daily.    . tamoxifen (NOLVADEX) 20 MG tablet Take 1 tablet (20 mg total) by mouth daily. 90 tablet 12   No current facility-administered medications for this visit.    OBJECTIVE: Middle-aged African-American woman who appears younger than stated age 89 Vitals:   04/30/15 1519  BP: 128/79  Pulse: 65  Temp: 97.4 F (36.3 C)  Resp: 18     Body mass index is 42.54 kg/(m^2).    ECOG FS:0 - Asymptomatic  Ocular: Sclerae unicteric, pupils equal, round and reactive to light Ear-nose-throat: Oropharynx clear, dentition in good repair Lymphatic: No cervical or supraclavicular adenopathy Lungs no rales or rhonchi, good excursion bilaterally Heart regular rate and rhythm, no murmur appreciated Abd  soft, nontender, positive bowel sounds MSK no focal spinal tenderness, no joint edema Neuro: non-focal, well-oriented, appropriate affect Breasts: the right breast is unremarkable. I do not palpate a mass in the left breast. There are no skin or nipple changes of concern. The left axilla is benign.   LAB RESULTS:  CMP     Component Value Date/Time   NA 138 04/30/2015 1504   NA 138 12/03/2014 1221   K 3.9 04/30/2015 1504   K 3.9 12/03/2014 1221   CL 106 12/03/2014 1221   CO2 21* 04/30/2015 1504   CO2 22 12/03/2014 1221   GLUCOSE 94 04/30/2015 1504   GLUCOSE 109* 12/03/2014 1221   BUN 14.1 04/30/2015 1504   BUN 7 12/03/2014 1221   CREATININE 1.1 04/30/2015 1504   CREATININE 1.06 12/03/2014 1221   CALCIUM 8.8 04/30/2015 1504   CALCIUM 9.0 12/03/2014 1221   PROT 7.4 04/30/2015 1504   PROT 7.4 12/03/2014 1221   ALBUMIN 3.3* 04/30/2015 1504   ALBUMIN 4.0 12/03/2014 1221   AST 19 04/30/2015 1504   AST 24 12/03/2014 1221   ALT 13 04/30/2015 1504   ALT 14 12/03/2014 1221   ALKPHOS 63 04/30/2015 1504   ALKPHOS 80  12/03/2014 1221   BILITOT 0.63 04/30/2015 1504   BILITOT 1.1 12/03/2014 1221   GFRNONAA 63* 12/03/2014 1221   GFRAA 73* 12/03/2014 1221    INo results found for: SPEP, UPEP  Lab Results  Component Value Date   WBC 7.4 04/30/2015   NEUTROABS 4.4 04/30/2015   HGB 10.2* 04/30/2015   HCT 31.3* 04/30/2015   MCV 81.4 04/30/2015   PLT 360 04/30/2015      Chemistry      Component Value Date/Time   NA 138 04/30/2015 1504   NA 138 12/03/2014 1221   K 3.9 04/30/2015 1504   K 3.9 12/03/2014 1221   CL 106 12/03/2014 1221   CO2 21* 04/30/2015 1504   CO2 22 12/03/2014 1221   BUN 14.1 04/30/2015 1504   BUN 7 12/03/2014 1221   CREATININE 1.1 04/30/2015 1504   CREATININE 1.06 12/03/2014 1221      Component Value Date/Time   CALCIUM 8.8 04/30/2015 1504   CALCIUM 9.0 12/03/2014 1221   ALKPHOS 63 04/30/2015 1504   ALKPHOS 80 12/03/2014 1221   AST 19 04/30/2015 1504    AST 24 12/03/2014 1221   ALT 13 04/30/2015 1504   ALT 14 12/03/2014 1221   BILITOT 0.63 04/30/2015 1504   BILITOT 1.1 12/03/2014 1221       No results found for: LABCA2  No components found for: [id number]  No results for input(s): INR in the last 168 hours.  Urinalysis    Component Value Date/Time   COLORURINE YELLOW 12/03/2014 1415   APPEARANCEUR CLEAR 12/03/2014 1415   LABSPEC 1.024 12/03/2014 1415   PHURINE 5.5 12/03/2014 1415   GLUCOSEU NEGATIVE 12/03/2014 1415   HGBUR NEGATIVE 12/03/2014 1415   BILIRUBINUR NEGATIVE 12/03/2014 1415   KETONESUR 15* 12/03/2014 1415   PROTEINUR NEGATIVE 12/03/2014 1415   UROBILINOGEN 0.2 12/03/2014 1415   NITRITE NEGATIVE 12/03/2014 1415   LEUKOCYTESUR NEGATIVE 12/03/2014 1415    STUDIES: Mm Digital Diagnostic Unilat L  04/11/2015   CLINICAL DATA:  Status post ultrasound-guided core biopsy of a left breast mass.  EXAM: DIAGNOSTIC LEFT MAMMOGRAM POST ULTRASOUND BIOPSY  COMPARISON:  Previous exam(s).  FINDINGS: Mammographic images were obtained following ultrasound guided biopsy of a mass in the 2 o'clock region of the left breast. Mammographic images show there is a ribbon shaped clip in the upper-outer quadrant of the left breast. It is difficult to see the mass following the biopsy because of the post biopsy changes but the clip is in the appropriate area.  IMPRESSION: Status post ultrasound-guided core biopsy of the left breast with pathology pending.  Final Assessment: Post Procedure Mammograms for Marker Placement   Electronically Signed   By: Lillia Mountain M.D.   On: 04/11/2015 09:09   US Breast Ltd Uni Left Inc Axilla  04/04/2015   CLINICAL DATA:  Screening callback for questioned left breast mass in the upper outer quadrant  EXAM: DIGITAL DIAGNOSTIC left MAMMOGRAM WITH 3D TOMOSYNTHESIS AND CAD  Left breast ultrasound  COMPARISON:  [facility]  ACR Breast Density Category c: The breast tissue is heterogeneously dense, which may obscure small  masses.  FINDINGS: There is a persistent mass in the left upper outer quadrant, corresponding to the screening mammographic finding.  Ultrasound is performed, demonstrating an irregular hypoechoic shadowing mass left breast 2 o'clock location 9 cm from the nipple measuring 7 x 7 x 6 mm. This corresponds to the mammographic finding. Within the deep left axilla, there is a normal side lymph node with  eccentric nodular cortical thickening measuring 4 mm in maximal thickness.  Mammographic images were processed with CAD.  IMPRESSION: Suspicious left breast mass 2 o'clock location. Ultrasound-guided core biopsy is recommended for further evaluation. Mild nodular cortical thickening of left axillary lymph node, which is felt to most likely be too deep for axillary core biopsy due to its physical occasion and patient habitus.  RECOMMENDATION: Left ultrasound-guided breast core biopsy  I have discussed the findings and recommendations with the patient. Results were also provided in writing at the conclusion of the visit. If applicable, a reminder letter will be sent to the patient regarding the next appointment.  BI-RADS CATEGORY  5: Highly suggestive of malignancy.   Electronically Signed   By: Conchita Paris M.D.   On: 04/04/2015 11:35   Mm Diag Breast Tomo Uni Left  04/04/2015   CLINICAL DATA:  Screening callback for questioned left breast mass in the upper outer quadrant  EXAM: DIGITAL DIAGNOSTIC left MAMMOGRAM WITH 3D TOMOSYNTHESIS AND CAD  Left breast ultrasound  COMPARISON:  [facility]  ACR Breast Density Category c: The breast tissue is heterogeneously dense, which may obscure small masses.  FINDINGS: There is a persistent mass in the left upper outer quadrant, corresponding to the screening mammographic finding.  Ultrasound is performed, demonstrating an irregular hypoechoic shadowing mass left breast 2 o'clock location 9 cm from the nipple measuring 7 x 7 x 6 mm. This corresponds to the mammographic finding.  Within the deep left axilla, there is a normal side lymph node with eccentric nodular cortical thickening measuring 4 mm in maximal thickness.  Mammographic images were processed with CAD.  IMPRESSION: Suspicious left breast mass 2 o'clock location. Ultrasound-guided core biopsy is recommended for further evaluation. Mild nodular cortical thickening of left axillary lymph node, which is felt to most likely be too deep for axillary core biopsy due to its physical occasion and patient habitus.  RECOMMENDATION: Left ultrasound-guided breast core biopsy  I have discussed the findings and recommendations with the patient. Results were also provided in writing at the conclusion of the visit. If applicable, a reminder letter will be sent to the patient regarding the next appointment.  BI-RADS CATEGORY  5: Highly suggestive of malignancy.   Electronically Signed   By: Conchita Paris M.D.   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 This was found to be concordant by Dr. Lillia Mountain. Pathology results were discussed with the patient via telephone. The patient reported no problems at the biopsy site. Post biopsy instructions were reviewed and questions were answered. The patient was encouraged to call [facility] with any additional questions and or concerns. The patient was referred to Dr. Nedra Hai of [facility] for a surgical consultation on Apr 17, 2015.  Pathology results reported by Terie Purser RN on Apr 12, 2015.   Electronically Signed   By: Lillia Mountain M.D.   On: 04/12/2015 15:14   04/17/2015   CLINICAL DATA:  44 year old female with a suspicious left breast mass.  EXAM: ULTRASOUND GUIDED LEFT BREAST CORE NEEDLE BIOPSY  COMPARISON:  Previous exam(s).  PROCEDURE: I met with  the patient and we discussed the procedure of ultrasound-guided biopsy, including benefits and alternatives. We discussed the high likelihood of a successful procedure.  We discussed the risks of the procedure including infection, bleeding, tissue injury, clip migration, and inadequate sampling. Informed written consent was given. The usual time-out protocol was performed immediately prior to the procedure.  Using sterile technique and 2% Lidocaine as local anesthetic, under direct ultrasound visualization, a 12 gauge vacuum-assisted device was used to perform biopsy of a mass in the 2 o'clock region of the left breastusing a lateral to medial approach. At the conclusion of the procedure, a ribbon shaped tissue marker clip was deployed into the biopsy cavity. Follow-up 2-view mammogram was performed and dictated separately.  IMPRESSION: Ultrasound-guided biopsy of the left breast. No apparent complications.  Electronically Signed: By: Lillia Mountain M.D. On: 04/11/2015 09:06    ASSESSMENT: 44 y.o. [place] woman status post left breast biopsy 04/11/2015 for a clinical T1b NX invasive lobular breast cancer, grade 1 or 2, strongly estrogen and progesterone receptor positive, HER-2 not amplified, with an MIB-1 of 19%.  (1) genetics testing pending  (2) breast MRI to determine whether left axillary node biopsy is needed and for definitive surgical planning  (3) consider neoadjuvant tamoxifen while surgical decision pending  (4) consider oncotype DX if tumor proves to be node-negative  PLAN: We spent the better part of today's hour-long appointment discussing the biology of breast cancer in general, and the specifics of the patient's tumor in particular. Evarose understands that lobular breast cancers can be harder to detect then the more, and ductal types. For that reason we are requesting an MRI of the breast. This may show the breast mass to be larger then the ultrasound. That would not mean that the  cancer has grown in the interim, only that it is an more sensitive test.  We then discussed the possible treatments for breast cancer including surgery and radiation as local treatments, and antiestrogen pills, anti-HER-2 immunotherapy, and chemotherapy as systemic treatments. As far as surgical options are concerned we reviewed the fact that there is no survival advantage to mastectomy over lumpectomy plus radiation. Even if she carries a deleterious mutation, she is not obligated to proceed to bilateral mastectomies (although which she may reasonably choose to) because we can do close monitoring yearly with MRIs and mammography.  As far as systemic treatment is concerned she will clearly benefit from 10 years of antiestrogen therapy. She will get no benefit at all from anti-HER-2 treatments. The benefit of chemotherapy is likely to be marginal. If we are dealing with a node positive tumor, then chemotherapy would be indicated. If the tumor proves to be node-negative, then we would request an Oncotype DX test to help [place] with the chemotherapy decision.  Because we are waiting to hear from genetics and because the patient can't have surgery until August in any case because of an upcoming wedding, there may be a significant period of time between now and her definitive surgery. For that reason we discussed neoadjuvant tamoxifen. She has a good understanding of the possible toxicities, side effects and complications of this agent. I can't ahead and place the prescription and she will start that this evening.  I have also contacted Dr. Lessie Dings office so that the patient's NuvaRing can be removed. I suggested a copper IUD be placed instead.  Adaley has a good understanding of the overall plan. She agrees with it. She knows the goal of treatment in her case is cure. She will call with any problems that may develop before her next visit here.  Chauncey Cruel, MD  04/30/2015 5:24 PM Medical Oncology  and Hematology [facility] [address] [place], [place] [address] Tel. [phone]    Fax. [phone]

## 2015-04-30 ENCOUNTER — Telehealth: Payer: Self-pay | Admitting: Oncology

## 2015-04-30 ENCOUNTER — Ambulatory Visit (HOSPITAL_BASED_OUTPATIENT_CLINIC_OR_DEPARTMENT_OTHER): Payer: Medicaid Other | Admitting: Oncology

## 2015-04-30 ENCOUNTER — Other Ambulatory Visit (HOSPITAL_BASED_OUTPATIENT_CLINIC_OR_DEPARTMENT_OTHER): Payer: Medicaid Other

## 2015-04-30 ENCOUNTER — Ambulatory Visit: Payer: Medicaid Other

## 2015-04-30 VITALS — BP 128/79 | HR 65 | Temp 97.4°F | Resp 18 | Ht 67.0 in | Wt 271.7 lb

## 2015-04-30 DIAGNOSIS — Z170 Estrogen receptor positive status [ER+]: Secondary | ICD-10-CM | POA: Diagnosis not present

## 2015-04-30 DIAGNOSIS — Z79811 Long term (current) use of aromatase inhibitors: Secondary | ICD-10-CM | POA: Diagnosis not present

## 2015-04-30 DIAGNOSIS — C50412 Malignant neoplasm of upper-outer quadrant of left female breast: Secondary | ICD-10-CM | POA: Diagnosis present

## 2015-04-30 DIAGNOSIS — Z803 Family history of malignant neoplasm of breast: Secondary | ICD-10-CM

## 2015-04-30 LAB — CBC WITH DIFFERENTIAL/PLATELET
BASO%: 0.4 % (ref 0.0–2.0)
Basophils Absolute: 0 10*3/uL (ref 0.0–0.1)
EOS%: 0.7 % (ref 0.0–7.0)
Eosinophils Absolute: 0.1 10*3/uL (ref 0.0–0.5)
HEMATOCRIT: 31.3 % — AB (ref 34.8–46.6)
HEMOGLOBIN: 10.2 g/dL — AB (ref 11.6–15.9)
LYMPH%: 33.7 % (ref 14.0–49.7)
MCH: 26.5 pg (ref 25.1–34.0)
MCHC: 32.6 g/dL (ref 31.5–36.0)
MCV: 81.4 fL (ref 79.5–101.0)
MONO#: 0.4 10*3/uL (ref 0.1–0.9)
MONO%: 5.8 % (ref 0.0–14.0)
NEUT%: 59.4 % (ref 38.4–76.8)
NEUTROS ABS: 4.4 10*3/uL (ref 1.5–6.5)
Platelets: 360 10*3/uL (ref 145–400)
RBC: 3.84 10*6/uL (ref 3.70–5.45)
RDW: 15.7 % — AB (ref 11.2–14.5)
WBC: 7.4 10*3/uL (ref 3.9–10.3)
lymph#: 2.5 10*3/uL (ref 0.9–3.3)

## 2015-04-30 LAB — COMPREHENSIVE METABOLIC PANEL (CC13)
ALK PHOS: 63 U/L (ref 40–150)
ALT: 13 U/L (ref 0–55)
AST: 19 U/L (ref 5–34)
Albumin: 3.3 g/dL — ABNORMAL LOW (ref 3.5–5.0)
Anion Gap: 8 mEq/L (ref 3–11)
BUN: 14.1 mg/dL (ref 7.0–26.0)
CO2: 21 mEq/L — ABNORMAL LOW (ref 22–29)
Calcium: 8.8 mg/dL (ref 8.4–10.4)
Chloride: 109 mEq/L (ref 98–109)
Creatinine: 1.1 mg/dL (ref 0.6–1.1)
EGFR: 72 mL/min/{1.73_m2} — ABNORMAL LOW (ref >90)
Glucose: 94 mg/dl (ref 70–140)
POTASSIUM: 3.9 meq/L (ref 3.5–5.1)
SODIUM: 138 meq/L (ref 136–145)
TOTAL PROTEIN: 7.4 g/dL (ref 6.4–8.3)
Total Bilirubin: 0.63 mg/dL (ref 0.20–1.20)

## 2015-04-30 MED ORDER — TAMOXIFEN CITRATE 20 MG PO TABS: 20.0000 mg | ORAL_TABLET | Freq: Every day | ORAL | Status: AC

## 2015-04-30 NOTE — Telephone Encounter (Signed)
Gave avs & calendar for October. Faxed MRI orderd to GI.

## 2015-04-30 NOTE — Progress Notes (Signed)
Location of Breast Cancer:Left breast upper-outer quadrant.  Histology per Pathology Report:04/11/15 FINAL DIAGNOSIS Diagnosis 04/11/15 Breast, left, needle core biopsy, 2 o'clock - INVASIVE MAMMARY CARCINOMA. - MAMMARY CARCINOMA IN SITU.  Receptor Status: ER(+), PR (+), Her2-neu (-)  Did patient present with symptoms (if so, please note symptoms) or was this found on screening mammography?:routine screening mammogram 4/26/16U/S 04/04/15  Past/Anticipated interventions by surgeon, if any:Not scheduled  Past/Anticipated interventions by medical oncology, if any: Chemotherapy,:  Dr. Jana Hakim appt 04/30/15 ,Breast Clinic ,   referral genetic counseling,genetic testing pending,   Lymphedema issues, if any:No  Pain issues, if any:Has numbness and tinglin sensation in right leg since starting tamoxifen.  SAFETY ISSUES:  Prior radiation?   Pacemaker/ICD? No  Possible current pregnancy?Tubal ligation in 2009. Is the patient on methotrexate? No Current Complaints / other details: Menarche age 46,GXP2, 1st live birth age 30,  Oral contraceptives Breast cancer mother(60),sister(39),Maternal Aunt breast cancer(60), Maternal Uncle , cousin and Maternal Grandfather Prostate cancer,   Allergies:Sulfa=Hives No history of smoking    Arlyss Repress, RN 04/30/2015,1:44 PM

## 2015-05-01 ENCOUNTER — Telehealth: Payer: Self-pay | Admitting: Oncology

## 2015-05-01 NOTE — Telephone Encounter (Signed)
Confirmed appointment moved from October to August.

## 2015-05-02 ENCOUNTER — Encounter: Payer: Self-pay | Admitting: Radiation Oncology

## 2015-05-03 ENCOUNTER — Ambulatory Visit
Admission: RE | Admit: 2015-05-03 | Discharge: 2015-05-03 | Disposition: A | Discharge status: Home / Self Care | Payer: Medicaid Other | Source: Ambulatory Visit | Attending: Radiation Oncology | Admitting: Radiation Oncology

## 2015-05-03 ENCOUNTER — Encounter: Payer: Self-pay | Admitting: Radiation Oncology

## 2015-05-03 ENCOUNTER — Encounter: Payer: Self-pay | Admitting: *Deleted

## 2015-05-03 VITALS — BP 151/74 | HR 61 | Temp 98.1°F | Resp 20 | Wt 272.3 lb

## 2015-05-03 DIAGNOSIS — Z79810 Long term (current) use of selective estrogen receptor modulators (SERMs): Secondary | ICD-10-CM | POA: Insufficient documentation

## 2015-05-03 DIAGNOSIS — C50412 Malignant neoplasm of upper-outer quadrant of left female breast: Secondary | ICD-10-CM

## 2015-05-03 DIAGNOSIS — Z803 Family history of malignant neoplasm of breast: Secondary | ICD-10-CM | POA: Diagnosis not present

## 2015-05-03 DIAGNOSIS — Z170 Estrogen receptor positive status [ER+]: Secondary | ICD-10-CM | POA: Diagnosis not present

## 2015-05-03 HISTORY — DX: Allergy, unspecified, initial encounter: T78.40XA

## 2015-05-03 NOTE — Progress Notes (Unsigned)
Oncology Nurse Navigator Documentation  Oncology Nurse Navigator Flowsheets 05/03/2015  Navigator Encounter Type Initial RadOnc  Patient Visit Type Radonc  Barriers/Navigation Needs No barriers at this time  Time Spent with Patient 30    Met with pt during new pt appt with Dr. Pablo Ledger. Relate doing well and denies needs at this time. Gave navigation resources and contact information. Encourage pt to call with questions or concerns. Received verbal understanding.

## 2015-05-03 NOTE — Progress Notes (Signed)
Please see the Nurse Progress Note in the MD Initial Consult Encounter for this patient. 

## 2015-05-03 NOTE — Progress Notes (Signed)
Radiation Oncology         [phone]) [phone] ________________________________  Initial outpatient Consultation - Date: 05/03/2015   Name: Rhonda Moss MRN: [id number]   DOB: 04-Feb-1971  REFERRING PHYSICIAN: Coralie Keens, MD  DIAGNOSIS AND STAGE: Breast cancer of upper-outer quadrant of left female breast   Staging form: Breast, AJCC 7th Edition     Clinical: Stage Unknown (T1b, NX, M0) - Signed by Chauncey Cruel, MD on 04/29/2015  [place] invasive lobular carcinoma ER/PR + and Her2 -  HISTORY OF PRESENT ILLNESS::Rhonda Moss is a 44 y.o. female who was found to have a mass in the left breast on screening mammogram in April of this year. Ultrasound confirmed a 0.7cm mass in the upper outer quadrant. In addition, a left axilla lymph node was noted with cortical thickening. On May 25, she underwent biopsy of this left breast mass which showed an invasive lobular carcinoma which was Grade I-II, ER positive at 99% and PR posivitve at 90% and Her2 negative. She has been referred to genetics and an MRI of the bilateral breasts has been ordered. She has a family history of breast cancer in her mother, sister, and maternal aunt. She had her period starting at age 46 and her first child at 26. She is premenopausal. She is not able to undergo surgery until August due to an upcoming wedding and waiting on results from genetic testing. She started neoadjuvant tamoxifen by Dr. Jana Hakim. She is referred to radiotherapy to discuss the management of her disease.  PREVIOUS RADIATION THERAPY: No  Past medical, social and family history were reviewed in the electronic chart. Review of symptoms was reviewed in the electronic chart. Medications were reviewed in the electronic chart.   PHYSICAL EXAM:  Filed Vitals:   05/03/15 0916  BP: 151/74  Pulse: 61  Temp: 98.1 F (36.7 C)  Resp: 20  .272 lb 4.8 oz (123.514 kg). Pleasant woman alert and oriented times three. Nop palpable abnormalities of the  bilateral breasts. No palpable axillary, cervical or supraclavicular adenopathy.   IMPRESSION: TIbNX invasive lobular carcinoma  Of the left breast ER/PR + and Her2 -  PLAN: We discussed the role of radiation and decreasing local failures in patients who undergo lumpectomy. We discussed the retrospective data showing an increase in failure rates in patients who have a pathologic complete response and did not undergo radiation. For this reason I have recommended radiation to the whole breast followed by boost to the tumor bed. We discussed the process of simulation and the placement of tattoos. We discussed possible side effects during treatment including but not limited to skin irritation darkness and fatigue. We discussed long-term effects of treatment which are extremely unlikely but possible including damage to the lungs and ribs.  We discussed the use of breath hold technique for cardiac sparing if necessary. We discussed the low likelihood of secondary malignancies. We discussed the possible late side effects including but not limited to permanent skin darkening, and breast swelling.  She will continue on tamoxifen for now.  We discussee genetic testing, MRI and oncotype. She met with our breast navigators. I will see her back after surgery if she elects for breast conservation.   I spent 40 minutes  face to face with the patient and more than 50% of that time was spent in counseling and/or coordination of care.  This document serves as a record of services personally performed by Thea Silversmith, MD. It was created on her behalf by [facility]  Khan, a trained medical scribe. The creation of this record is based on the scribe's personal observations and the provider's statements to them. This document has been checked and approved by the attending provider.    ------------------------------------------------  Stacy Wentworth, MD 

## 2015-05-03 NOTE — Addendum Note (Signed)
Encounter addended by: Norm Salt, RN on: 05/03/2015 11:04 AM<BR>     Documentation filed: Charges VN

## 2015-05-07 ENCOUNTER — Encounter: Payer: Self-pay | Admitting: Genetic Counselor

## 2015-05-07 ENCOUNTER — Telehealth: Payer: Self-pay | Admitting: Genetic Counselor

## 2015-05-07 DIAGNOSIS — Z803 Family history of malignant neoplasm of breast: Secondary | ICD-10-CM

## 2015-05-07 DIAGNOSIS — Z8042 Family history of malignant neoplasm of prostate: Secondary | ICD-10-CM

## 2015-05-07 DIAGNOSIS — Z1379 Encounter for other screening for genetic and chromosomal anomalies: Secondary | ICD-10-CM

## 2015-05-07 DIAGNOSIS — C50412 Malignant neoplasm of upper-outer quadrant of left female breast: Secondary | ICD-10-CM

## 2015-05-07 NOTE — Progress Notes (Signed)
GENETIC TEST RESULTS   HPI: [name] was previously seen in the [facility] due to a personal history of breast cancer at the age of 44 and family history of breast and prostate cancer which was concerning for a potential hereditary predisposition to cancer. Please refer to our prior cancer genetics clinic note for more information regarding [name]'s medical, social and family histories, and our assessment and recommendations, at the time. [name] recent genetic test results were disclosed to her, as were recommendations warranted by these results. These results and recommendations are discussed in more detail below.  GENETIC TEST RESULTS: At the time of [name]'s visit on 04/19/15, we recommended she pursue genetic testing of the 21-gene Breast/Ovarian Cancer panel through [facility].  The Breast/Ovarian gene panel offered by [facility] included next-generation sequencing and deletion/duplication analysis for the following 20 genes:  ATM, BARD1, BRCA1, BRCA2, BRIP1, CDH1, CHEK2, FANCC, MLH1, MSH2, MSH6, NBN, PALB2, PMS2, PTEN, RAD51C, RAD51D, STK11, TP53, and XRCC2.  Furthermore, deletion/duplication analysis (without next-generation sequencing) was performed for the EPCAM gene. The test result is back, the report date of which is 05/04/15.  Genetic testing was normal, and did not reveal a deleterious mutation in these genes.  Additionally, no variants of uncertain significance (VUSs) were found.  The test report will scanned into EPIC and will be located under the Media tab.   We discussed with [name] that since the current genetic testing is not perfect, it is possible there may be a gene mutation in one of these genes that current testing cannot detect, but that chance is small. We also discussed, that it is possible that another gene that has not yet been discovered, or that we have not yet tested, is responsible for the cancer diagnoses in the  family, and it is, therefore, important to remain in touch with cancer genetics in the future so that we can continue to offer [name] the most up to date genetic testing.   CANCER SCREENING RECOMMENDATIONS: Given [name]'s personal and family history of cancer, we must interpret these negative results with some caution.  Families with features suggestive of hereditary risk for cancer tend to have multiple family members with cancer, diagnoses in multiple generations and diagnoses before the age of 67. [name] family exhibits some of these features. Thus this result may simply reflect our current inability to detect all mutations within these genes or there may be a different gene that has not yet been discovered or tested.   It is reasonable for [name] to be followed by a high-risk breast cancer clinic; in addition to a yearly mammogram and physical exam by a healthcare provider, she should discuss the usefulness of an annual breast MRI with the high-risk clinic providers.    RECOMMENDATIONS FOR FAMILY MEMBERS: Women in this family might be at some increased risk of developing cancer, over the general population risk, simply due to the family history of cancer. Women in this family should have a yearly mammogram beginning at age 67, or 70 years younger than the earliest onset of cancer, an annual clinical breast exam, and perform monthly breast self-exams. Women in this family should also have a gynecological exam as recommended by their primary provider. Men in the family should consider prostate cancer screening beginning at the age of 108-45, or as recommended by their doctor.  All family members should have a colonoscopy by age 43.    FOLLOW-UP: Lastly, we discussed  with [name] that cancer genetics is a rapidly advancing field and it is possible that new genetic tests will be appropriate for her and/or her family members in the future. We encouraged her to remain in contact  with cancer genetics on an annual basis so we can update her personal and family histories and let her know of advances in cancer genetics that may benefit this family.   Our contact number was provided. [name] questions were answered to her satisfaction, and she knows she is welcome to call us at anytime with additional questions or concerns.   Jeanine Luz, MS Genetic Counselor Phone: [phone] [email or web address]

## 2015-05-07 NOTE — Telephone Encounter (Signed)
Genetic testing was normal, and did not reveal a deleterious mutation in these genes. Additionally, no variants of uncertain significance (VUSs) were found.  The test report will be scanned into EPIC and will be located under the Media tab.   We still do not have an explanation for why Rhonda Moss got breast cancer at a young age, and we do not have an explanation for the family history of breat and related cancers. She should continue to follow the cancer management and screening guidelines based upon her personal and family history of cancer, and as provided by her oncology and primary healthcare providers.

## 2015-05-09 ENCOUNTER — Ambulatory Visit
Admission: RE | Admit: 2015-05-09 | Discharge: 2015-05-09 | Disposition: A | Discharge status: Home / Self Care | Payer: Medicaid Other | Source: Ambulatory Visit | Attending: Oncology | Admitting: Oncology

## 2015-05-09 DIAGNOSIS — C50412 Malignant neoplasm of upper-outer quadrant of left female breast: Secondary | ICD-10-CM

## 2015-05-09 MED ORDER — GADOBENATE DIMEGLUMINE 529 MG/ML IV SOLN
20.0000 mL | Freq: Once | INTRAVENOUS | Status: AC | PRN
  Administered 2015-05-09: 20 mL via INTRAVENOUS

## 2015-05-10 ENCOUNTER — Other Ambulatory Visit: Payer: Self-pay | Admitting: Oncology

## 2015-05-10 DIAGNOSIS — C50412 Malignant neoplasm of upper-outer quadrant of left female breast: Secondary | ICD-10-CM

## 2015-05-22 ENCOUNTER — Other Ambulatory Visit: Payer: Self-pay | Admitting: Surgery

## 2015-05-28 ENCOUNTER — Ambulatory Visit
Admission: RE | Admit: 2015-05-28 | Discharge: 2015-05-28 | Disposition: A | Discharge status: Home / Self Care | Payer: Medicaid Other | Source: Ambulatory Visit | Attending: Oncology | Admitting: Oncology

## 2015-05-28 ENCOUNTER — Other Ambulatory Visit: Payer: Self-pay

## 2015-05-28 DIAGNOSIS — C50412 Malignant neoplasm of upper-outer quadrant of left female breast: Secondary | ICD-10-CM

## 2015-06-04 ENCOUNTER — Other Ambulatory Visit: Payer: Self-pay | Admitting: Surgery

## 2015-06-04 DIAGNOSIS — C50912 Malignant neoplasm of unspecified site of left female breast: Secondary | ICD-10-CM

## 2015-06-06 ENCOUNTER — Other Ambulatory Visit: Payer: Self-pay | Admitting: Surgery

## 2015-06-07 ENCOUNTER — Encounter: Payer: Self-pay | Admitting: *Deleted

## 2015-06-07 ENCOUNTER — Other Ambulatory Visit: Payer: Self-pay | Admitting: Surgery

## 2015-06-07 DIAGNOSIS — C50912 Malignant neoplasm of unspecified site of left female breast: Secondary | ICD-10-CM

## 2015-06-08 ENCOUNTER — Telehealth: Payer: Self-pay | Admitting: Hematology and Oncology

## 2015-06-08 NOTE — Telephone Encounter (Signed)
Scheduled post op f/u w/VG per 7/21 pof from navigator. Left message for patient and sent message to navigator to confirm if existing f/u with GM should be cxd.

## 2015-06-12 ENCOUNTER — Telehealth: Payer: Self-pay | Admitting: Oncology

## 2015-06-12 NOTE — Telephone Encounter (Signed)
Per 7/25 response from navigator - patient will remain scheduled with GM. Per navigator appointment with VG has been cxd and she has contacted patient.

## 2015-06-12 NOTE — Telephone Encounter (Signed)
Per

## 2015-06-18 HISTORY — PX: BREAST LUMPECTOMY: SHX2

## 2015-06-19 ENCOUNTER — Encounter (HOSPITAL_BASED_OUTPATIENT_CLINIC_OR_DEPARTMENT_OTHER): Payer: Self-pay | Admitting: *Deleted

## 2015-06-21 ENCOUNTER — Ambulatory Visit
Admission: RE | Admit: 2015-06-21 | Discharge: 2015-06-21 | Disposition: A | Discharge status: Home / Self Care | Payer: Medicaid Other | Source: Ambulatory Visit | Attending: Surgery | Admitting: Surgery

## 2015-06-21 DIAGNOSIS — C50912 Malignant neoplasm of unspecified site of left female breast: Secondary | ICD-10-CM

## 2015-06-21 NOTE — H&P (Signed)
Rhonda Moss  Location: [facility] Patient #: [id number] DOB: 15-Nov-1971 Divorced / Language: English / Race: Black or African American Female  History of Present Illness ([name] [name] MD;  Patient words: Breast cancer.  The patient is a 44 year old female who presents with breast cancer. This is a pleasant female referred by Dr. [name] after the recent diagnosis of a left breast cancer. This was found on her yearly screening mammography. An ultrasound showed it to be approximately 7 mm in size. 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 She has a strong family history of breast cancer in her mother and sister. She has not had genetic testing. She has had no previous problems with her breasts. She is otherwise without complaints   Other Problems ([name], LPN;  Breast Cancer  Past Surgical History [name], LPN;  Cesarean Section - 1 Gallbladder Surgery - Laparoscopic  Diagnostic Studies History [name], LPN; Colonoscopy never Mammogram within last year Pap Smear 1-5 years ago  Allergies [name], LPN;  No Known Drug [id number]/[id number]  Medication History ([name], LPN;  Ibuprofen (889VQ Capsule, Oral) Active. ZyrTEC Allergy (10MG Capsule, Oral) Active. Medications Reconciled  Social History [name], LPN; Alcohol use Occasional alcohol use. Caffeine use Coffee, Tea. No drug use Tobacco use Never smoker.  Family History ([name], LPN;  Breast Cancer Mother, Sister. Heart disease in female family member before age 72 Hypertension Mother.  Pregnancy / Birth History [name], LPN; Age at menarche 84 years. Contraceptive History Oral contraceptives. Gravida 7 Maternal age 76-20 Para 2 Regular periods  Review of Systems ([name] LPN;  General Present- Night Sweats and Weight Gain. Not Present- Appetite Loss, Chills, Fatigue, Fever and Weight  Loss. Skin Present- Hives. Not Present- Change in Wart/Mole, Dryness, Jaundice, New Lesions, Non-Healing Wounds, Rash and Ulcer. HEENT Not Present- Earache, Hearing Loss, Hoarseness, Nose Bleed, Oral Ulcers, Ringing in the Ears, Seasonal Allergies, Sinus Pain, Sore Throat, Visual Disturbances, Wears glasses/contact lenses and Yellow Eyes. Respiratory Not Present- Bloody sputum, Chronic Cough, Difficulty Breathing, Snoring and Wheezing. Breast Not Present- Breast Mass, Breast Pain, Nipple Discharge and Skin Changes. Cardiovascular Present- Swelling of Extremities. Not Present- Chest Pain, Difficulty Breathing Lying Down, Leg Cramps, Palpitations, Rapid Heart Rate and Shortness of Breath. Gastrointestinal Present- Constipation. Not Present- Abdominal Pain, Bloating, Bloody Stool, Change in Bowel Habits, Chronic diarrhea, Difficulty Swallowing, Excessive gas, Gets full quickly at meals, Hemorrhoids, Indigestion, Nausea, Rectal Pain and Vomiting. Female Genitourinary Not Present- Frequency, Nocturia, Painful Urination, Pelvic Pain and Urgency. Musculoskeletal Not Present- Back Pain, Joint Pain, Joint Stiffness, Muscle Pain, Muscle Weakness and Swelling of Extremities. Neurological Present- Headaches. Not Present- Decreased Memory, Fainting, Numbness, Seizures, Tingling, Tremor, Trouble walking and Weakness. Psychiatric Not Present- Anxiety, Bipolar, Change in Sleep Pattern, Depression, Fearful and Frequent crying. Endocrine Not Present- Cold Intolerance, Excessive Hunger, Hair Changes, Heat Intolerance, Hot flashes and New Diabetes. Hematology Not Present- Easy Bruising, Excessive bleeding, Gland problems, HIV and Persistent Infections.   Vitals [name] LPN; 9/45/0388 8:28 PM Weight: 274.25 lb Height: 67in Body Surface Area: 2.42 m Body Mass Index: 42.95 kg/m Temp.: 98.101F  BP: 162/104 (Sitting, Left Arm, Standard)    Physical Exam (Dream Nodal [name] MD; General Mental  Status-Alert. General Appearance-Consistent with stated age. Hydration-Well hydrated. Voice-Normal.  Head and Neck Head-normocephalic, atraumatic with no lesions or palpable masses. Trachea-midline. Thyroid Gland Characteristics - normal size and consistency.  Eye Eyeball - Bilateral-Extraocular movements intact. Sclera/Conjunctiva - Bilateral-No scleral  icterus.  Chest and Lung Exam Chest and lung exam reveals -quiet, even and easy respiratory effort with no use of accessory muscles and on auscultation, normal breath sounds, no adventitious sounds and normal vocal resonance. Inspection Chest Wall - Normal. Back - normal.  Breast Breast - Left-Symmetric, Non Tender, No Biopsy scars, no Dimpling, No Inflammation, No Lumpectomy scars, No Mastectomy scars, No Peau d' Orange. Breast - Right-Symmetric, Non Tender, No Biopsy scars, no Dimpling, No Inflammation, No Lumpectomy scars, No Mastectomy scars, No Peau d' Orange. Breast Lump-No Palpable Breast Mass.  Cardiovascular Cardiovascular examination reveals -normal heart sounds, regular rate and rhythm with no murmurs and normal pedal pulses bilaterally.  Abdomen Inspection Inspection of the abdomen reveals - No Hernias. Skin - Scar - no surgical scars. Palpation/Percussion Palpation and Percussion of the abdomen reveal - Soft, Non Tender, No Rebound tenderness, No Rigidity (guarding) and No hepatosplenomegaly. Auscultation Auscultation of the abdomen reveals - Bowel sounds normal.  Neurologic Neurologic evaluation reveals -alert and oriented x 3 with no impairment of recent or remote memory. Mental Status-Normal.  Musculoskeletal Normal Exam - Left-Upper Extremity Strength Normal and Lower Extremity Strength Normal. Normal Exam - Right-Upper Extremity Strength Normal and Lower Extremity Strength Normal.  Lymphatic Head & Neck  General Head & Neck Lymphatics: Bilateral - Description -  Normal. Axillary  General Axillary Region: Bilateral - Description - Normal. Tenderness - Non Tender. Femoral & Inguinal  Generalized Femoral & Inguinal Lymphatics: Bilateral - Description - Normal. Tenderness - Non Tender.    Assessment & Plan ([name] 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 I discussed the diagnosis with her in detail. She has had both medical and radiation oncology consults as well as  genetic counseling and testing.   We will now proceed with a radioactive seed localized left breast lumpectomy and sentinel lymph node biopsy.  I discussed the risks which include but are not limited to bleeding, infection, injury to surrounding structures, need for further surgery, cardiopulmonary issues, DVT, post op recovery, etc.     Signed by Harl Bowie, MD (04/17/2015 3:37 PM)

## 2015-06-22 ENCOUNTER — Encounter (HOSPITAL_BASED_OUTPATIENT_CLINIC_OR_DEPARTMENT_OTHER): Payer: Self-pay | Admitting: *Deleted

## 2015-06-22 ENCOUNTER — Encounter (HOSPITAL_BASED_OUTPATIENT_CLINIC_OR_DEPARTMENT_OTHER)
Admission: RE | Disposition: A | Discharge status: Home / Self Care | Payer: Self-pay | Source: Ambulatory Visit | Attending: Surgery

## 2015-06-22 ENCOUNTER — Ambulatory Visit (HOSPITAL_BASED_OUTPATIENT_CLINIC_OR_DEPARTMENT_OTHER): Payer: Medicaid Other | Admitting: Certified Registered"

## 2015-06-22 ENCOUNTER — Ambulatory Visit (HOSPITAL_BASED_OUTPATIENT_CLINIC_OR_DEPARTMENT_OTHER)
Admission: RE | Admit: 2015-06-22 | Discharge: 2015-06-22 | Disposition: A | Discharge status: Home / Self Care | Payer: Medicaid Other | Source: Ambulatory Visit | Attending: Surgery | Admitting: Surgery

## 2015-06-22 ENCOUNTER — Ambulatory Visit
Admission: RE | Admit: 2015-06-22 | Discharge: 2015-06-22 | Disposition: A | Discharge status: Home / Self Care | Payer: Medicaid Other | Source: Ambulatory Visit | Attending: Surgery | Admitting: Surgery

## 2015-06-22 ENCOUNTER — Encounter (HOSPITAL_COMMUNITY)
Admission: RE | Admit: 2015-06-22 | Discharge: 2015-06-22 | Disposition: A | Discharge status: Home / Self Care | Payer: Medicaid Other | Source: Ambulatory Visit | Attending: Surgery | Admitting: Surgery

## 2015-06-22 DIAGNOSIS — C50412 Malignant neoplasm of upper-outer quadrant of left female breast: Secondary | ICD-10-CM | POA: Insufficient documentation

## 2015-06-22 DIAGNOSIS — F419 Anxiety disorder, unspecified: Secondary | ICD-10-CM | POA: Diagnosis not present

## 2015-06-22 DIAGNOSIS — Z6841 Body Mass Index (BMI) 40.0 and over, adult: Secondary | ICD-10-CM | POA: Insufficient documentation

## 2015-06-22 DIAGNOSIS — C50912 Malignant neoplasm of unspecified site of left female breast: Secondary | ICD-10-CM

## 2015-06-22 DIAGNOSIS — Z803 Family history of malignant neoplasm of breast: Secondary | ICD-10-CM | POA: Insufficient documentation

## 2015-06-22 HISTORY — DX: Anxiety disorder, unspecified: F41.9

## 2015-06-22 HISTORY — PX: BREAST LUMPECTOMY WITH RADIOACTIVE SEED AND SENTINEL LYMPH NODE BIOPSY: SHX6550

## 2015-06-22 SURGERY — BREAST LUMPECTOMY WITH RADIOACTIVE SEED AND SENTINEL LYMPH NODE BIOPSY
Anesthesia: Regional | Site: Breast | Laterality: Left

## 2015-06-22 MED ORDER — METHYLENE BLUE 1 % INJ SOLN
INTRAMUSCULAR | Status: AC
  Filled 2015-06-22: qty 10

## 2015-06-22 MED ORDER — SODIUM CHLORIDE 0.9 % IJ SOLN
INTRAMUSCULAR | Status: DC | PRN
  Administered 2015-06-22: 5 mL

## 2015-06-22 MED ORDER — HYDROMORPHONE HCL 1 MG/ML IJ SOLN
INTRAMUSCULAR | Status: AC
  Filled 2015-06-22: qty 1

## 2015-06-22 MED ORDER — CEFAZOLIN SODIUM 1-5 GM-% IV SOLN
INTRAVENOUS | Status: AC
  Filled 2015-06-22: qty 50

## 2015-06-22 MED ORDER — OXYCODONE HCL 5 MG/5ML PO SOLN: 5.0000 mg | Freq: Once | ORAL | Status: AC | PRN

## 2015-06-22 MED ORDER — SODIUM CHLORIDE 0.9 % IJ SOLN: 3.0000 mL | INTRAMUSCULAR | Status: DC | PRN

## 2015-06-22 MED ORDER — MEPERIDINE HCL 25 MG/ML IJ SOLN: 6.2500 mg | INTRAMUSCULAR | Status: DC | PRN

## 2015-06-22 MED ORDER — MIDAZOLAM HCL 2 MG/2ML IJ SOLN
INTRAMUSCULAR | Status: AC
  Filled 2015-06-22: qty 2

## 2015-06-22 MED ORDER — BUPIVACAINE-EPINEPHRINE 0.5% -1:200000 IJ SOLN
INTRAMUSCULAR | Status: DC | PRN
  Administered 2015-06-22: 20 mL

## 2015-06-22 MED ORDER — OXYCODONE-ACETAMINOPHEN 5-325 MG PO TABS: 1.0000 | ORAL_TABLET | ORAL | Status: DC | PRN

## 2015-06-22 MED ORDER — CEFAZOLIN SODIUM-DEXTROSE 2-3 GM-% IV SOLR
INTRAVENOUS | Status: AC
  Filled 2015-06-22: qty 50

## 2015-06-22 MED ORDER — GLYCOPYRROLATE 0.2 MG/ML IJ SOLN: 0.2000 mg | Freq: Once | INTRAMUSCULAR | Status: DC | PRN

## 2015-06-22 MED ORDER — TECHNETIUM TC 99M SULFUR COLLOID FILTERED
1.0000 | Freq: Once | INTRAVENOUS | Status: AC | PRN
  Administered 2015-06-22: 1 via INTRADERMAL

## 2015-06-22 MED ORDER — SODIUM CHLORIDE 0.9 % IJ SOLN: 3.0000 mL | Freq: Two times a day (BID) | INTRAMUSCULAR | Status: DC

## 2015-06-22 MED ORDER — OXYCODONE HCL 5 MG PO TABS
5.0000 mg | ORAL_TABLET | Freq: Once | ORAL | Status: AC | PRN
  Administered 2015-06-22: 5 mg via ORAL

## 2015-06-22 MED ORDER — ACETAMINOPHEN 325 MG PO TABS: 650.0000 mg | ORAL_TABLET | ORAL | Status: DC | PRN

## 2015-06-22 MED ORDER — LACTATED RINGERS IV SOLN
INTRAVENOUS | Status: DC
  Administered 2015-06-22: 07:00:00 via INTRAVENOUS

## 2015-06-22 MED ORDER — FENTANYL CITRATE (PF) 100 MCG/2ML IJ SOLN
INTRAMUSCULAR | Status: AC
  Filled 2015-06-22: qty 2

## 2015-06-22 MED ORDER — MORPHINE SULFATE 2 MG/ML IJ SOLN: 1.0000 mg | INTRAMUSCULAR | Status: DC | PRN

## 2015-06-22 MED ORDER — BUPIVACAINE-EPINEPHRINE (PF) 0.5% -1:200000 IJ SOLN
INTRAMUSCULAR | Status: AC
  Filled 2015-06-22: qty 30

## 2015-06-22 MED ORDER — LIDOCAINE HCL (CARDIAC) 20 MG/ML IV SOLN
INTRAVENOUS | Status: DC | PRN
  Administered 2015-06-22: 30 mg via INTRAVENOUS

## 2015-06-22 MED ORDER — CEFAZOLIN SODIUM-DEXTROSE 2-3 GM-% IV SOLR
2.0000 g | INTRAVENOUS | Status: AC
  Administered 2015-06-22: 3 g via INTRAVENOUS

## 2015-06-22 MED ORDER — MIDAZOLAM HCL 2 MG/2ML IJ SOLN
1.0000 mg | INTRAMUSCULAR | Status: DC | PRN
  Administered 2015-06-22: 1 mg via INTRAVENOUS
  Administered 2015-06-22: 2 mg via INTRAVENOUS

## 2015-06-22 MED ORDER — FENTANYL CITRATE (PF) 100 MCG/2ML IJ SOLN
INTRAMUSCULAR | Status: AC
  Filled 2015-06-22: qty 6

## 2015-06-22 MED ORDER — SODIUM CHLORIDE 0.9 % IV SOLN: 250.0000 mL | INTRAVENOUS | Status: DC | PRN

## 2015-06-22 MED ORDER — HYDROMORPHONE HCL 1 MG/ML IJ SOLN
0.2500 mg | INTRAMUSCULAR | Status: DC | PRN
  Administered 2015-06-22: 0.5 mg via INTRAVENOUS
  Administered 2015-06-22: 0.25 mg via INTRAVENOUS
  Administered 2015-06-22: 0.5 mg via INTRAVENOUS

## 2015-06-22 MED ORDER — SCOPOLAMINE 1 MG/3DAYS TD PT72: 1.0000 | MEDICATED_PATCH | Freq: Once | TRANSDERMAL | Status: DC | PRN

## 2015-06-22 MED ORDER — ONDANSETRON HCL 4 MG/2ML IJ SOLN
INTRAMUSCULAR | Status: DC | PRN
  Administered 2015-06-22: 4 mg via INTRAVENOUS

## 2015-06-22 MED ORDER — ACETAMINOPHEN 650 MG RE SUPP: 650.0000 mg | RECTAL | Status: DC | PRN

## 2015-06-22 MED ORDER — OXYCODONE HCL 5 MG PO TABS: 5.0000 mg | ORAL_TABLET | ORAL | Status: DC | PRN

## 2015-06-22 MED ORDER — FENTANYL CITRATE (PF) 100 MCG/2ML IJ SOLN
50.0000 ug | INTRAMUSCULAR | Status: AC | PRN
  Administered 2015-06-22: 100 ug via INTRAVENOUS
  Administered 2015-06-22: 50 ug via INTRAVENOUS
  Administered 2015-06-22: 25 ug via INTRAVENOUS

## 2015-06-22 MED ORDER — PROPOFOL 10 MG/ML IV BOLUS
INTRAVENOUS | Status: DC | PRN
  Administered 2015-06-22: 260 mg via INTRAVENOUS

## 2015-06-22 MED ORDER — SODIUM CHLORIDE 0.9 % IJ SOLN
INTRAMUSCULAR | Status: AC
  Filled 2015-06-22: qty 10

## 2015-06-22 MED ORDER — OXYCODONE HCL 5 MG PO TABS
ORAL_TABLET | ORAL | Status: AC
  Filled 2015-06-22: qty 1

## 2015-06-22 MED ORDER — BUPIVACAINE-EPINEPHRINE (PF) 0.5% -1:200000 IJ SOLN
INTRAMUSCULAR | Status: DC | PRN
  Administered 2015-06-22: 30 mL via PERINEURAL

## 2015-06-22 MED ORDER — DEXAMETHASONE SODIUM PHOSPHATE 4 MG/ML IJ SOLN
INTRAMUSCULAR | Status: DC | PRN
  Administered 2015-06-22: 10 mg via INTRAVENOUS

## 2015-06-22 SURGICAL SUPPLY — 50 items
APPLIER CLIP 9.375 MED OPEN (MISCELLANEOUS) ×3
BLADE HEX COATED 2.75 (ELECTRODE) ×3 IMPLANT
BLADE SURG 15 STRL LF DISP TIS (BLADE) ×1 IMPLANT
BLADE SURG 15 STRL SS (BLADE) ×2
CANISTER SUCT 1200ML W/VALVE (MISCELLANEOUS) IMPLANT
CHLORAPREP W/TINT 26ML (MISCELLANEOUS) ×3 IMPLANT
CLIP APPLIE 9.375 MED OPEN (MISCELLANEOUS) ×1 IMPLANT
CLIP TI WIDE RED SMALL 6 (CLIP) IMPLANT
CLOSURE WOUND 1/2 X4 (GAUZE/BANDAGES/DRESSINGS)
COVER BACK TABLE 60X90IN (DRAPES) ×3 IMPLANT
COVER MAYO STAND STRL (DRAPES) ×3 IMPLANT
COVER PROBE W GEL 5X96 (DRAPES) ×3 IMPLANT
DECANTER SPIKE VIAL GLASS SM (MISCELLANEOUS) IMPLANT
DEVICE DUBIN W/COMP PLATE 8390 (MISCELLANEOUS) ×3 IMPLANT
DRAPE LAPAROSCOPIC ABDOMINAL (DRAPES) ×3 IMPLANT
DRAPE UTILITY XL STRL (DRAPES) ×3 IMPLANT
DRSG TEGADERM 4X4.75 (GAUZE/BANDAGES/DRESSINGS) IMPLANT
ELECT REM PT RETURN 9FT ADLT (ELECTROSURGICAL) ×3
ELECTRODE REM PT RTRN 9FT ADLT (ELECTROSURGICAL) ×1 IMPLANT
GLOVE BIO SURGEON STRL SZ 6.5 (GLOVE) ×2 IMPLANT
GLOVE BIO SURGEONS STRL SZ 6.5 (GLOVE) ×1
GLOVE BIOGEL PI IND STRL 7.0 (GLOVE) ×2 IMPLANT
GLOVE BIOGEL PI INDICATOR 7.0 (GLOVE) ×4
GLOVE SURG SIGNA 7.5 PF LTX (GLOVE) ×3 IMPLANT
GOWN STRL REUS W/ TWL LRG LVL3 (GOWN DISPOSABLE) ×1 IMPLANT
GOWN STRL REUS W/ TWL XL LVL3 (GOWN DISPOSABLE) ×1 IMPLANT
GOWN STRL REUS W/TWL LRG LVL3 (GOWN DISPOSABLE) ×2
GOWN STRL REUS W/TWL XL LVL3 (GOWN DISPOSABLE) ×2
KIT MARKER MARGIN INK (KITS) ×3 IMPLANT
LIQUID BAND (GAUZE/BANDAGES/DRESSINGS) ×3 IMPLANT
NDL SAFETY ECLIPSE 18X1.5 (NEEDLE) ×1 IMPLANT
NEEDLE HYPO 18GX1.5 SHARP (NEEDLE) ×2
NEEDLE HYPO 25X1 1.5 SAFETY (NEEDLE) ×6 IMPLANT
NS IRRIG 1000ML POUR BTL (IV SOLUTION) IMPLANT
PACK BASIN DAY SURGERY FS (CUSTOM PROCEDURE TRAY) ×3 IMPLANT
PENCIL BUTTON HOLSTER BLD 10FT (ELECTRODE) ×3 IMPLANT
SLEEVE SCD COMPRESS KNEE MED (MISCELLANEOUS) ×3 IMPLANT
SPONGE GAUZE 4X4 12PLY STER LF (GAUZE/BANDAGES/DRESSINGS) IMPLANT
SPONGE LAP 4X18 X RAY DECT (DISPOSABLE) ×3 IMPLANT
STRIP CLOSURE SKIN 1/2X4 (GAUZE/BANDAGES/DRESSINGS) IMPLANT
SUT MNCRL AB 4-0 PS2 18 (SUTURE) ×3 IMPLANT
SUT SILK 2 0 SH (SUTURE) IMPLANT
SUT VIC AB 3-0 SH 27 (SUTURE) ×2
SUT VIC AB 3-0 SH 27X BRD (SUTURE) ×1 IMPLANT
SYR CONTROL 10ML LL (SYRINGE) ×6 IMPLANT
TOWEL OR 17X24 6PK STRL BLUE (TOWEL DISPOSABLE) ×3 IMPLANT
TOWEL OR NON WOVEN STRL DISP B (DISPOSABLE) ×3 IMPLANT
TUBE CONNECTING 20'X1/4 (TUBING)
TUBE CONNECTING 20X1/4 (TUBING) IMPLANT
YANKAUER SUCT BULB TIP NO VENT (SUCTIONS) IMPLANT

## 2015-06-22 NOTE — Op Note (Signed)
NAME:  Rhonda Moss, Rhonda Moss               ACCOUNT NO.:  [id number]  MEDICAL RECORD NO.:  [id number]  LOCATION:  [facility]                          FACILITY:  [facility]  PHYSICIAN:  Coralie Keens, M.D. DATE OF BIRTH:  1970/11/28  DATE OF PROCEDURE:  06/22/2015 DATE OF DISCHARGE:                              OPERATIVE REPORT   PREOPERATIVE DIAGNOSIS:  Left breast cancer.  POSTOPERATIVE DIAGNOSIS:  Left breast cancer.  PROCEDURE:  Radioactive seed localized left breast partial mastectomy with left sentinel lymph node biopsy.  SURGEON:  Coralie Keens, M.D.  ANESTHESIA:  General with nerve block and Marcaine.  ESTIMATED BLOOD LOSS:  Minimal.  INDICATIONS:  This is a 44 year old female who was found to have invasive lobular carcinoma of the left breast.  She now presents for a partial mastectomy with sentinel lymph node biopsy.  FINDINGS:  Two sentinel lymph nodes were removed along with the partial mastectomy specimen.  PROCEDURE IN DETAIL:  The patient was identified in the holding area as the correct patient.  The Neoprobe was used to confirm that the radioactive seed was in the left breast.  Radioactive isotope was also injected around the areola of the breast.  The patient was then taken to the operating room and placed in supine position on the operating table where general anesthesia was induced.  Her left breast and axilla were then prepped and draped in usual sterile fashion.  I injected blue dye underneath the areola of the left breast and massaged the breast.  I then brought the Neoprobe on the field.  I identified the area of __________ uptake from the radioactive seed in the upper outer quadrant of the left breast.  I then made an incision with a scalpel after anesthetizing with Marcaine.  I then took down the breast tissue with electrocautery.  With the aid of Neoprobe, I performed a partial mastectomy removing the upper outer quadrant of the right breast going down to the  chest wall.  Once the partial mastectomy specimen was removed, the sheath was confirmed to be with the Neoprobe in the specimen.  I marked all quadrants with marker paint.  X-ray confirmed that the seed and previous clip were in the specimen.  This was sent to Pathology for evaluation.  The Neoprobe was then again used to identify the sentinel lymph nodes.  As the incision was in the upper outer quadrant of the breast, I was able to easily get to the axilla and identify 2 sentinel lymph nodes with the aid of Neoprobe and these were both removed.  One was found to contain blue dye.  These were then sent to Pathology for evaluation.  I then placed surgical clips around the partial mastectomy site.  Hemostasis appeared to be achieved.  I anesthetized the wound further with Marcaine. I then closed the subcutaneous tissue with interrupted 3-0 Vicryl sutures and closed the skin with a running 4-0 Monocryl. Skin glue was then applied.  The patient tolerated the procedure well. All the counts were correct at the end of procedure.  The patient was then extubated in the operating room and taken in stable condition to the recovery room.     Coralie Keens,  M.D.     DB/MEDQ  D:  06/22/2015  T:  06/22/2015  Job:  [id number]

## 2015-06-22 NOTE — Anesthesia Postprocedure Evaluation (Signed)
  Anesthesia Post-op Note  Patient: Rhonda Moss  Procedure(s) Performed: Procedure(s): LEFT BREAST LUMPECTOMY WITH RADIOACTIVE SEED AND SENTINEL LYMPH NODE BIOPSY (Left)  Patient Location: PACU  Anesthesia Type: General, Regional   Level of Consciousness: awake, alert  and oriented  Airway and Oxygen Therapy: Patient Spontanous Breathing  Post-op Pain: mild  Post-op Assessment: Post-op Vital signs reviewed  Post-op Vital Signs: Reviewed  Last Vitals:  Filed Vitals:   06/22/15 0920  BP: 137/86  Pulse: 64  Temp: 36.5 C  Resp: 18    Complications: No apparent anesthesia complications

## 2015-06-22 NOTE — Progress Notes (Signed)
Emotional support during breast injections

## 2015-06-22 NOTE — Anesthesia Procedure Notes (Addendum)
Procedure Name: LMA Insertion Date/Time: 06/22/2015 7:30 AM Performed by: BLOCKER, TIMOTHY D Pre-anesthesia Checklist: Patient identified, Emergency Drugs available, Suction available and Patient being monitored Patient Re-evaluated:Patient Re-evaluated prior to inductionOxygen Delivery Method: Circle System Utilized Preoxygenation: Pre-oxygenation with 100% oxygen Intubation Type: IV induction Ventilation: Mask ventilation without difficulty LMA: LMA inserted LMA Size: 4.0 Number of attempts: 1 Airway Equipment and Method: Bite block Placement Confirmation: positive ETCO2 Tube secured with: Tape Dental Injury: Teeth and Oropharynx as per pre-operative assessment     Anesthesia Regional Block:  Pectoralis block  Pre-Anesthetic Checklist: ,, timeout performed, Correct Patient, Correct Site, Correct Laterality, Correct Procedure, Correct Position, site marked, Risks and benefits discussed,  Surgical consent,  Pre-op evaluation,  At surgeon's request and post-op pain management  Laterality: Left and Upper  Prep: chloraprep       Needles:  Injection technique: Single-shot  Needle Type: Echogenic Needle     Needle Length: 9cm 9 cm Needle Gauge: 21 and 21 G    Additional Needles:  Procedures: ultrasound guided (picture in chart) Pectoralis block Narrative:  Start time: 06/22/2015 6:58 AM End time: 06/22/2015 7:03 AM Injection made incrementally with aspirations every 5 mL.  Performed by: Personally  Anesthesiologist: Leanza Shepperson

## 2015-06-22 NOTE — Transfer of Care (Signed)
Immediate Anesthesia Transfer of Care Note  Patient: Rhonda Moss  Procedure(s) Performed: Procedure(s): LEFT BREAST LUMPECTOMY WITH RADIOACTIVE SEED AND SENTINEL LYMPH NODE BIOPSY (Left)  Patient Location: PACU  Anesthesia Type:GA combined with regional for post-op pain  Level of Consciousness: awake, alert , oriented and patient cooperative  Airway & Oxygen Therapy: Patient Spontanous Breathing and Patient connected to face mask oxygen  Post-op Assessment: Report given to RN and Post -op Vital signs reviewed and stable  Post vital signs: Reviewed and stable  Last Vitals:  Filed Vitals:   06/22/15 0812  BP:   Pulse: 78  Temp:   Resp: 18    Complications: No apparent anesthesia complications

## 2015-06-22 NOTE — Progress Notes (Signed)
Assisted Dr. Al Corpus with left, ultrasound guided, pectoralis block. Side rails up, monitors on throughout procedure. See vital signs in flow sheet. Tolerated Procedure well.

## 2015-06-22 NOTE — Interval H&P Note (Signed)
History and Physical Interval Note: no change in H and P  06/22/2015 7:05 AM  Rhonda Moss  has presented today for surgery, with the diagnosis of Left Breast Cancer  The various methods of treatment have been discussed with the patient and family. After consideration of risks, benefits and other options for treatment, the patient has consented to  Procedure(s): LEFT BREAST LUMPECTOMY WITH RADIOACTIVE SEED AND SENTINEL LYMPH NODE BIOPSY (Left) as a surgical intervention .  The patient's history has been reviewed, patient examined, no change in status, stable for surgery.  I have reviewed the patient's chart and labs.  Questions were answered to the patient's satisfaction.     Kush Farabee A

## 2015-06-22 NOTE — Discharge Instructions (Signed)
[facility] Office Phone Number [phone]  BREAST BIOPSY/ PARTIAL MASTECTOMY: POST OP INSTRUCTIONS  Always review your discharge instruction sheet given to you by the facility where your surgery was performed.  IF YOU HAVE DISABILITY OR FAMILY LEAVE FORMS, YOU MUST BRING THEM TO THE OFFICE FOR PROCESSING.  DO NOT GIVE THEM TO YOUR DOCTOR.  1. A prescription for pain medication may be given to you upon discharge.  Take your pain medication as prescribed, if needed.  If narcotic pain medicine is not needed, then you may take acetaminophen (Tylenol) or ibuprofen (Advil) as needed. 2. Take your usually prescribed medications unless otherwise directed 3. If you need a refill on your pain medication, please contact your pharmacy.  They will contact our office to request authorization.  Prescriptions will not be filled after 5pm or on week-ends. 4. You should eat very light the first 24 hours after surgery, such as soup, crackers, pudding, etc.  Resume your normal diet the day after surgery. 5. Most patients will experience some swelling and bruising in the breast.  Ice packs and a good support bra will help.  Swelling and bruising can take several days to resolve.  6. It is common to experience some constipation if taking pain medication after surgery.  Increasing fluid intake and taking a stool softener will usually help or prevent this problem from occurring.  A mild laxative (Milk of Magnesia or Miralax) should be taken according to package directions if there are no bowel movements after 48 hours. 7. Unless discharge instructions indicate otherwise, you may remove your bandages 24-48 hours after surgery, and you may shower at that time.  You may have steri-strips (small skin tapes) in place directly over the incision.  These strips should be left on the skin for 7-10 days.  If your surgeon used skin glue on the incision, you may shower in 24 hours.  The glue will flake off over the  next 2-3 weeks.  Any sutures or staples will be removed at the office during your follow-up visit. 8. ACTIVITIES:  You may resume regular daily activities (gradually increasing) beginning the next day.  Wearing a good support bra or sports bra minimizes pain and swelling.  You may have sexual intercourse when it is comfortable. a. You may drive when you no longer are taking prescription pain medication, you can comfortably wear a seatbelt, and you can safely maneuver your car and apply brakes. b. RETURN TO WORK:  ______________________________________________________________________________________ 9. You should see your doctor in the office for a follow-up appointment approximately two weeks after your surgery.  Your doctors nurse will typically make your follow-up appointment when she calls you with your pathology report.  Expect your pathology report 2-3 business days after your surgery.  You may call to check if you do not hear from [place] after three days. 10. OTHER INSTRUCTIONS: ___ICE PACK AND IBUPROFEN ALSO FOR PAIN 11. ____________________________________________________________________________________________ _____________________________________________________________________________________________________________________________________ _____________________________________________________________________________________________________________________________________ _____________________________________________________________________________________________________________________________________  WHEN TO CALL YOUR DOCTOR: 1. Fever over 101.0 2. Nausea and/or vomiting. 3. Extreme swelling or bruising. 4. Continued bleeding from incision. 5. Increased pain, redness, or drainage from the incision.  The clinic staff is available to answer your questions during regular business hours.  Please dont hesitate to call and ask to speak to one of the nurses for clinical concerns.  If you have a  medical emergency, go to the nearest emergency room or call 911.  A surgeon from [facility] is always on call at the hospital.  For further questions, please visit [email or web address]    Post Anesthesia Home Care Instructions  Activity: Get plenty of rest for the remainder of the day. A responsible adult should stay with you for 24 hours following the procedure.  For the next 24 hours, DO NOT: -Drive a car -Paediatric nurse -Drink alcoholic beverages -Take any medication unless instructed by your physician -Make any legal decisions or sign important papers.  Meals: Start with liquid foods such as gelatin or soup. Progress to regular foods as tolerated. Avoid greasy, spicy, heavy foods. If nausea and/or vomiting occur, drink only clear liquids until the nausea and/or vomiting subsides. Call your physician if vomiting continues.  Special Instructions/Symptoms: Your throat may feel dry or sore from the anesthesia or the breathing tube placed in your throat during surgery. If this causes discomfort, gargle with warm salt water. The discomfort should disappear within 24 hours.  If you had a scopolamine patch placed behind your ear for the management of post- operative nausea and/or vomiting:  1. The medication in the patch is effective for 72 hours, after which it should be removed.  Wrap patch in a tissue and discard in the trash. Wash hands thoroughly with soap and water. 2. You may remove the patch earlier than 72 hours if you experience unpleasant side effects which may include dry mouth, dizziness or visual disturbances. 3. Avoid touching the patch. Wash your hands with soap and water after contact with the patch.    Regional Anesthesia Blocks  1. Numbness or the inability to move the "blocked" extremity may last from 3-48 hours after placement. The length of time depends on the medication injected and your individual response to the medication. If the numbness is  not going away after 48 hours, call your surgeon.  2. The extremity that is blocked will need to be protected until the numbness is gone and the  Strength has returned. Because you cannot feel it, you will need to take extra care to avoid injury. Because it may be weak, you may have difficulty moving it or using it. You may not know what position it is in without looking at it while the block is in effect.  3. For blocks in the legs and feet, returning to weight bearing and walking needs to be done carefully. You will need to wait until the numbness is entirely gone and the strength has returned. You should be able to move your leg and foot normally before you try and bear weight or walk. You will need someone to be with you when you first try to ensure you do not fall and possibly risk injury.  4. Bruising and tenderness at the needle site are common side effects and will resolve in a few days.  5. Persistent numbness or new problems with movement should be communicated to the surgeon or the [place] [phone] [place] [phone]).

## 2015-06-22 NOTE — Anesthesia Preprocedure Evaluation (Addendum)
Anesthesia Evaluation  Patient identified by MRN, date of birth, ID band Patient awake    Reviewed: Allergy & Precautions, NPO status   Airway Mallampati: I  TM Distance: >3 FB Neck ROM: Full    Dental  (+) Teeth Intact, Dental Advisory Given   Pulmonary  breath sounds clear to auscultation        Cardiovascular Rhythm:Regular Rate:Normal     Neuro/Psych PSYCHIATRIC DISORDERS Anxiety    GI/Hepatic   Endo/Other  Morbid obesity  Renal/GU      Musculoskeletal   Abdominal   Peds  Hematology   Anesthesia Other Findings   Reproductive/Obstetrics                            Anesthesia Physical Anesthesia Plan  ASA: II  Anesthesia Plan: General and Regional   Post-op Pain Management:    Induction: Intravenous  Airway Management Planned: LMA  Additional Equipment:   Intra-op Plan:   Post-operative Plan: Extubation in OR  Informed Consent: I have reviewed the patients History and Physical, chart, labs and discussed the procedure including the risks, benefits and alternatives for the proposed anesthesia with the patient or authorized representative who has indicated his/her understanding and acceptance.   Dental advisory given  Plan Discussed with: CRNA, Anesthesiologist and Surgeon  Anesthesia Plan Comments:         Anesthesia Quick Evaluation

## 2015-06-22 NOTE — Op Note (Signed)
LEFT BREAST LUMPECTOMY WITH RADIOACTIVE SEED AND SENTINEL LYMPH NODE BIOPSY  Procedure Note  BRYSON PALEN 06/22/2015   Pre-op Diagnosis: Left Breast Cancer     Post-op Diagnosis: SAME  Procedure(s): LEFT BREAST PARTIAL MASTECTOMY WITH RADIOACTIVE SEED AND SENTINEL LYMPH NODE BIOPSY  Surgeon(s): Coralie Keens, MD  Anesthesia: General  Staff:  Circulator: Ted Mcalpine, RN Scrub Person: Izora Ribas, RN  Estimated Blood Loss: Minimal               Specimens: SENT TO PATH          Coralie Keens A   Date: 06/22/2015  Time: 8:07 AM

## 2015-06-25 ENCOUNTER — Encounter (HOSPITAL_BASED_OUTPATIENT_CLINIC_OR_DEPARTMENT_OTHER): Payer: Self-pay | Admitting: Surgery

## 2015-06-26 ENCOUNTER — Telehealth: Payer: Self-pay | Admitting: *Deleted

## 2015-06-26 NOTE — Telephone Encounter (Signed)
Received order from Dr. Jana Hakim for oncotype testing. Requisition sent to pathology. Received by Tammy.

## 2015-06-29 ENCOUNTER — Ambulatory Visit: Payer: Medicaid Other | Admitting: Hematology and Oncology

## 2015-07-05 ENCOUNTER — Encounter (HOSPITAL_COMMUNITY): Payer: Self-pay

## 2015-07-10 ENCOUNTER — Ambulatory Visit (HOSPITAL_BASED_OUTPATIENT_CLINIC_OR_DEPARTMENT_OTHER): Payer: Medicaid Other | Admitting: Oncology

## 2015-07-10 ENCOUNTER — Telehealth: Payer: Self-pay | Admitting: Oncology

## 2015-07-10 VITALS — BP 139/99 | HR 76 | Temp 98.1°F | Resp 18 | Ht 67.0 in | Wt 270.8 lb

## 2015-07-10 DIAGNOSIS — C50412 Malignant neoplasm of upper-outer quadrant of left female breast: Secondary | ICD-10-CM | POA: Diagnosis not present

## 2015-07-10 DIAGNOSIS — Z170 Estrogen receptor positive status [ER+]: Secondary | ICD-10-CM

## 2015-07-10 DIAGNOSIS — Z79811 Long term (current) use of aromatase inhibitors: Secondary | ICD-10-CM | POA: Diagnosis not present

## 2015-07-10 MED ORDER — TAMOXIFEN CITRATE 20 MG PO TABS: 20.0000 mg | ORAL_TABLET | Freq: Every day | ORAL | Status: DC

## 2015-07-10 NOTE — Progress Notes (Signed)
[facility]  [phone] [phone]     ID: Rhonda Moss DOB: Nov 27, 1970  MR#: [id number]  WVP#:[id number]  Patient Care Team: Rhonda Hamman, MD as PCP - General (Obstetrics and Gynecology) Rhonda Cruel, MD as Consulting Physician (Oncology) PCP: Rhonda Hamman, MD GYN: SU: Rhonda Keens, MD OTHER MD:   CHIEF COMPLAINT: Estrogen receptor positive left breast cancer  CURRENT TREATMENT:  Adjuvant radiation therapy pending;  Continuing on (neo) adjuvant tamoxifen   BREAST CANCER HISTORY: From the original intake note:  The patient had routine bilateral screening mammography at the breast center 03/13/2015 showing a possible mass in the left breast. Left diagnostic mammography with tomosynthesis and left breast ultrasonography 04/04/2015 found the breast density to be category C area did in the left breast upper outer quadrant there was a persistent mass which by ultrasound was irregular and hypoechoic. It measured 0.7 cm. In the left axilla there was a normal size lymph node with eccentric cortical thickening measuring 4 mm in maximal thickness.  On 04/11/2015 the patient underwent biopsy of the left breast mass in question and this showed ([facility] [phone]) and invasive lobular carcinoma, E-cadherin negative, grade 1 or 2, estrogen receptor 99% positive, progesterone receptor 90% positive, both with strong staining intensity, with an MIB-1 of 19%. HER-2 was not amplified, the signals ratio being 1.09 and the number per cell 1.90.  The patient's case was presented at the multidisciplinary breast cancer conference 04/18/2015. At that time it was recommended that the patient be referred to genetics and undergo breast MRI.   Her subsequent history is as detailed below  INTERVAL HISTORY: Rhonda Moss returns today for follow-up of her breast cancer. Since her last visit here she underwent left lumpectomy and sentinel lymph node biopsy, 06/22/2015. The  final pathology from that procedure ([facility] [phone]) showed an invasive lobular carcinoma measuring 0.9 cm, grade 1, with negative margins. Both sentinel lymph nodes were clear. An Oncotype was sent from this material and showed a recurrence score of 22 predicting a 10 year of recurrence outside of the breast of 14% if the patient's only systemic therapy is tamoxifen for 5 years.  The patient's case was discussed at the multidisciplinary breast cancer conference 07/04/2015. It was felt to question of chemotherapy would demand a discussion prior to proceeding to radiation. Since the last visit the patient also underwent genetic testing which showed no deleterious mutations and no variants of uncertain significance.   she was started on tamoxifen neoadjuvantly because of anticipated delays in her surgery. She is tolerating that with no hot flashes. There is no vaginal defined dryness or wetness concern. She has not had a period in the last month however. She obtains a drug in approximately $3 a month.  The patient is here today to discuss the chemotherapy question  REVIEW OF SYSTEMS:  she did remarkably well with her surgery. She had mild to moderate pain. She did become constipated from the narcotics she received, but that has resolved. She never had fever or bleeding problems except for the fact that she did develop hemorrhoids from the constipation.  Otherwise she is back to work and in fact was back to work the weekend after her surgery. A detailed review of systems today was otherwise stable  PAST MEDICAL HISTORY: Past Medical History  Diagnosis Date  . Ectopic pregnancy   . Family history of breast cancer   . Family history of kidney cancer   . Colon polyps   . Breast  cancer dx. 44 04/11/15    ILC of left breast  . Allergy   . Anxiety     PAST SURGICAL HISTORY: Past Surgical History  Procedure Laterality Date  . Lymphadenectomy  1992  . Tubal ligation  2009  . Cervical biopsy  w/ loop  electrode excision    . Cholecystectomy    . Cesarean section    . Breast lumpectomy with radioactive seed and sentinel lymph node biopsy Left 06/22/2015    Procedure: LEFT BREAST LUMPECTOMY WITH RADIOACTIVE SEED AND SENTINEL LYMPH NODE BIOPSY;  Surgeon: Rhonda Keens, MD;  Location: [facility];  Service: General;  Laterality: Left;    FAMILY HISTORY Family History  Problem Relation Age of Onset  . Breast cancer Mother 55  . Breast cancer Sister 73  . Breast cancer Maternal Aunt 60    +calcifications  . Prostate cancer Maternal Uncle 67  . Heart attack Maternal Grandmother   . Heart attack Maternal Grandfather   . Prostate cancer Maternal Grandfather 57  . Diabetes Paternal Grandmother   . Pancreatitis Paternal Grandfather   . Prostate cancer Cousin     dx. late 79s  the patient's parents are living, in their late 39s. The patient has one brother, and one sister. The patient's mother was diagnosed with breast cancer at age 73 and  One of the patient's mother's 2 sisters was also diagnosed with breast cancer at age 68. The patient's on sister was diagnosed with breast cancer at age 59. She was tested for the BRCA gene mutations and was negative.  GYNECOLOGIC HISTORY:  No LMP recorded. Menarche age 43 first live birth age 50. The patient is GX P2. Her periods are regular, lasting between 5 and 7 days. They're now slightly better controlled on a NuvaRing IUD.   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 Her job involves a great deal of computer work and some driving, occasionally some walking. At home is just her and her son is a Chancellor, currently 6 years old. The patient's daughter Rhonda Moss, 20, also sometimes stays at home. She is studying sports medicine. The patient's significant other, Rhonda Moss, a works for [employer].    ADVANCED DIRECTIVES: not in place; Not in place. At the initial clinic visit 07/10/2015 the patient was given  the appropriate forms to complete and notarize at her discretion.  HEALTH MAINTENANCE: Social History  Substance Use Topics  . Smoking status: Never Smoker   . Smokeless tobacco: Not on file  . Alcohol Use: 0.0 oz/week    0 Standard drinks or equivalent per week     Comment: social     Colonoscopy:  PAP: NOV 2015  Bone density:  Lipid panel:  Allergies  Allergen Reactions  . Sulfa Antibiotics Hives    Current Outpatient Prescriptions  Medication Sig Dispense Refill  . ALPRAZolam (XANAX) 0.5 MG tablet Take 0.5 mg by mouth at bedtime as needed for anxiety.    . cetirizine (ZYRTEC) 10 MG tablet Take 10 mg by mouth daily.    Marland Kitchen oxyCODONE-acetaminophen (ROXICET) 5-325 MG per tablet Take 1-2 tablets by mouth every 4 (four) hours as needed for severe pain. 30 tablet 0  . tamoxifen (NOLVADEX) 20 MG tablet Take 20 mg by mouth daily.     No current facility-administered medications for this visit.    OBJECTIVE: Middle-aged African-American woman  In no acute distress Filed Vitals:   07/10/15 0924  BP: 139/99  Pulse: 76  Temp: 98.1 F (36.7 C)  Resp: 18     Body mass index is 42.4 kg/(m^2).    ECOG FS:0 - Asymptomatic  Sclerae unicteric, pupils round and equal Oropharynx clear and moist-- no thrush or other lesions No cervical or supraclavicular adenopathy Lungs no rales or rhonchi Heart regular rate and rhythm Abd soft,  Obese,nontender, positive bowel sounds MSK no focal spinal tenderness, no upper extremity lymphedema Neuro: nonfocal, well oriented, appropriate affect Breasts:  The right breast is unremarkable. The left breast is status post lumpectomy. The incisions are healing very nicely. Currently there is a little bit of edema so the left breast is slightly larger than the right. There is no erythema however. The left axilla is benign.    LAB RESULTS:  CMP     Component Value Date/Time   NA 138 04/30/2015 1504   NA 138 12/03/2014 1221   K 3.9 04/30/2015 1504     K 3.9 12/03/2014 1221   CL 106 12/03/2014 1221   CO2 21* 04/30/2015 1504   CO2 22 12/03/2014 1221   GLUCOSE 94 04/30/2015 1504   GLUCOSE 109* 12/03/2014 1221   BUN 14.1 04/30/2015 1504   BUN 7 12/03/2014 1221   CREATININE 1.1 04/30/2015 1504   CREATININE 1.06 12/03/2014 1221   CALCIUM 8.8 04/30/2015 1504   CALCIUM 9.0 12/03/2014 1221   PROT 7.4 04/30/2015 1504   PROT 7.4 12/03/2014 1221   ALBUMIN 3.3* 04/30/2015 1504   ALBUMIN 4.0 12/03/2014 1221   AST 19 04/30/2015 1504   AST 24 12/03/2014 1221   ALT 13 04/30/2015 1504   ALT 14 12/03/2014 1221   ALKPHOS 63 04/30/2015 1504   ALKPHOS 80 12/03/2014 1221   BILITOT 0.63 04/30/2015 1504   BILITOT 1.1 12/03/2014 1221   GFRNONAA 63* 12/03/2014 1221   GFRAA 73* 12/03/2014 1221    INo results found for: SPEP, UPEP  Lab Results  Component Value Date   WBC 7.4 04/30/2015   NEUTROABS 4.4 04/30/2015   HGB 10.2* 04/30/2015   HCT 31.3* 04/30/2015   MCV 81.4 04/30/2015   PLT 360 04/30/2015      Chemistry      Component Value Date/Time   NA 138 04/30/2015 1504   NA 138 12/03/2014 1221   K 3.9 04/30/2015 1504   K 3.9 12/03/2014 1221   CL 106 12/03/2014 1221   CO2 21* 04/30/2015 1504   CO2 22 12/03/2014 1221   BUN 14.1 04/30/2015 1504   BUN 7 12/03/2014 1221   CREATININE 1.1 04/30/2015 1504   CREATININE 1.06 12/03/2014 1221      Component Value Date/Time   CALCIUM 8.8 04/30/2015 1504   CALCIUM 9.0 12/03/2014 1221   ALKPHOS 63 04/30/2015 1504   ALKPHOS 80 12/03/2014 1221   AST 19 04/30/2015 1504   AST 24 12/03/2014 1221   ALT 13 04/30/2015 1504   ALT 14 12/03/2014 1221   BILITOT 0.63 04/30/2015 1504   BILITOT 1.1 12/03/2014 1221       No results found for: LABCA2  No components found for: LABCA125  No results for input(s): INR in the last 168 hours.  Urinalysis    Component Value Date/Time   COLORURINE YELLOW 12/03/2014 1415   APPEARANCEUR CLEAR 12/03/2014 1415   LABSPEC 1.024 12/03/2014 1415   PHURINE  5.5 12/03/2014 1415   GLUCOSEU NEGATIVE 12/03/2014 1415   HGBUR NEGATIVE 12/03/2014 1415   BILIRUBINUR NEGATIVE 12/03/2014 1415   KETONESUR 15* 12/03/2014 1415   PROTEINUR NEGATIVE 12/03/2014  1415   UROBILINOGEN 0.2 12/03/2014 1415   NITRITE NEGATIVE 12/03/2014 1415   LEUKOCYTESUR NEGATIVE 12/03/2014 1415    STUDIES: Nm Sentinel Node Inj-no Rpt (breast)  06/22/2015   CLINICAL DATA: left breast cancer   Sulfur colloid was injected intradermally by the nuclear medicine  technologist for breast cancer sentinel node localization.    Mm Breast Surgical Specimen  06/22/2015   CLINICAL DATA:  Status post surgical excision.  EXAM: SPECIMEN RADIOGRAPH OF THE LEFT BREAST  COMPARISON:  Previous exam(s).  FINDINGS: Status post excision of the left breast. The radioactive seed and biopsy marker clip are present, completely intact, and were marked for pathology. Findings discussed with Rhonda Moss in the OR at the time of surgery.  IMPRESSION: Specimen radiograph of the left breast.   Electronically Signed   By: Franki Cabot M.D.   On: 06/22/2015 07:56   Mm Lt Radioactive Seed Loc Mammo Guide  06/21/2015   CLINICAL DATA:  Patient is a 44 year old female presents for preoperative radioactive seed localization of the left breast.  EXAM: MAMMOGRAPHIC GUIDED RADIOACTIVE SEED LOCALIZATION OF THE LEFT BREAST  COMPARISON:  Previous exam(s)  FINDINGS: Patient presents for radioactive seed localization prior to lumpectomy. I met with the patient and we discussed the procedure of seed localization including benefits and alternatives. We discussed the high likelihood of a successful procedure. We discussed the risks of the procedure including infection, bleeding, tissue injury and further surgery. We discussed the low dose of radioactivity involved in the procedure. Informed, written consent was given.  The usual time-out protocol was performed immediately prior to the procedure.  Using mammographic guidance, sterile technique,  2% lidocaine and an I-125 radioactive seed, the ribbon shaped metal tissue marker in the posterior upper outer left breast localized using a lateral approach. The follow-up mammogram images confirm the seed in the expected location.  Order number of I-125 seed:  [id number].  Total activity:  0.241 mCi  reference Date: June 05, 2015  The patient tolerated the procedure well and was released from the [facility]. She was given instructions regarding seed removal.  IMPRESSION: Radioactive seed localization of the left breast. No apparent complications.   Electronically Signed   By: Andres Shad   On: 06/21/2015 13:36    ASSESSMENT: 44 [place] status post left breast biopsy 04/11/2015 for a clinical T1b NX invasive lobular breast cancer, grade 1 or 2, strongly estrogen and progesterone receptor positive, HER-2 not amplified, with an MIB-1 of 19%.  (1) genetics testing 05/04/2015 through the [facility] panel/ [facility] showed no deleterious mutations in ATM, BARD1, BRCA1, BRCA2, BRIP1, CDH1, CHEK2, FANCC, MLH1, MSH2, MSH6, NBN, PALB2, PMS2, PTEN, RAD51C, RAD51D, STK11, TP53, and XRCC2. Furthermore, deletion/duplication analysis (without next-generation sequencing) was performed for the EPCAM gene.   (2) status post left lumpectomy and sentinel lymph node sampling 06/22/2015 for a pT1b pN0, stage iA invasive lobular breast cancer, grade 1, repeat HER-2 again negative, with ample margins   (3)  Continuing (neo)adjuvant tamoxifen , started June 2016  (4) Oncotype DX score of 22 predicts a risk of recurrence outside the breast within the next 10 years of 14% if the patient's only systemic treatment is tamoxifen for 5 years.   (a)  The patient opted to forego chemotherapy given the very marginal anticipated benefit  (5)  Adjuvant radiation pending  PLAN:  I spent approximately 45 minutes today with Rhonda Moss going over her surgery results. She understands a lobular breast  cancers  are difficult to detect, but her margins were negative, which is very favorable. Lymph nodes were not involved. All this adds up to  A good prognosis, which she is going to improve by continuing the tamoxifen likely for 10 years.   She understands the next step is adjuvant radiation and we are setting her up for an appointment with Dr. Pablo Ledger in the next 2 weeks or so. She had many questions regarding this , but generally was reassured regarding the need for her complete her local treatment.    She is going to return to see me in early November. By that time she should be done with radiation and we can start routine long-term follow-up.   Rhonda Moss has a good understanding of the overall plan per she agrees with it. She knows a goal of treatment in her case is cure. She will call with any problems that may develop before her next visit here.   Rhonda Cruel, MD   07/10/2015 9:33 AM Medical Oncology and Hematology [facility] [address] [place], [place] [address] Tel. [phone]    Fax. [phone]

## 2015-07-10 NOTE — Telephone Encounter (Signed)
Gave patient avs and appointments for November and also appointment with Dr. Pablo Ledger 07/25/15.

## 2015-07-25 ENCOUNTER — Ambulatory Visit
Admission: RE | Admit: 2015-07-25 | Discharge: 2015-07-25 | Disposition: A | Discharge status: Home / Self Care | Payer: Medicaid Other | Source: Ambulatory Visit | Attending: Radiation Oncology | Admitting: Radiation Oncology

## 2015-07-25 ENCOUNTER — Ambulatory Visit: Payer: Medicaid Other

## 2015-07-31 ENCOUNTER — Ambulatory Visit
Admission: RE | Admit: 2015-07-31 | Discharge: 2015-07-31 | Disposition: A | Discharge status: Home / Self Care | Payer: Medicaid Other | Source: Ambulatory Visit | Attending: Radiation Oncology | Admitting: Radiation Oncology

## 2015-07-31 VITALS — BP 146/80 | HR 68 | Temp 98.0°F | Wt 274.7 lb

## 2015-07-31 DIAGNOSIS — C50412 Malignant neoplasm of upper-outer quadrant of left female breast: Secondary | ICD-10-CM

## 2015-07-31 NOTE — Progress Notes (Signed)
Please see the Nurse Progress Note in the MD Initial Consult Encounter for this patient. 

## 2015-07-31 NOTE — Progress Notes (Addendum)
FUNC post -op  06/22/15 BREAST LUMPECTOMY WITH RADIOACTIVE SEED AND SENTINEL LYMPH NODE BIOPSY  Diagnosis 1. Breast, lumpectomy, Left - INVASIVE LOBULAR CARCINOMA, SEE COMMENT. - LOBULAR NEOPLASIA (ATYPICAL LOBULAR HYPERPLASIA AND IN SITU CARCINOMA) - PREVIOUS BIOPSY SITE. - SEE TUMOR SYNOPTIC TEMPLATE BELOW. 2. Lymph node, sentinel, biopsy, Left axillary #1 - ONE LYMPH NODE, NEGATIVE FOR TUMOR (0/1). SEE COMMENT. 3. Lymph node, sentinel, biopsy, Left axillary #2 - ONE LYMPH NODE, NEGATIVE FOR TUMOR (0/1). SEE COMMENT.  Denies pain.BP 146/80 mmHg  Pulse 68  Temp(Src) 98 F (36.7 C)  Wt 274 lb 11.2 oz (124.603 kg)  (1) genetics testing 05/04/2015 through the [facility] panel/ [facility] showed no deleterious mutations in ATM, BARD1, BRCA1, BRCA2, BRIP1, CDH1, CHEK2, FANCC, MLH1, MSH2, MSH6, NBN, PALB2, PMS2, PTEN, RAD51C, RAD51D, STK11, TP53, and XRCC2. Furthermore, deletion/duplication analysis (without next-generation sequencing) was performed for the EPCAM gene.   (2) status post left lumpectomy and sentinel lymph node sampling 06/22/2015 for a pT1b pN0, stage iA invasive lobular breast cancer, grade 1, repeat HER-2 again negative, with ample margins   (3) Continuing (neo)adjuvant tamoxifen , started June 2016  (4) Oncotype DX score of 22 predicts a risk of recurrence outside the breast within the next 10 years of 14% if the patient's only systemic treatment is tamoxifen for 5 years.  (a) The patient opted to forego chemotherapy given the very marginal anticipated benefit  (5) Adjuvant radiation pending  PLAN: I spent approximately 45 minutes today with Rhonda Moss going over her surgery results. She understands a lobular breast cancers are difficult to detect, but her margins were negative, which is very favorable. Lymph nodes were not involved. All this adds up to A good prognosis, which she is going to improve by continuing the tamoxifen likely for  10 years.   She understands the next step is adjuvant radiation and we are setting her up for an appointment with Dr. Pablo Ledger in the next 2 weeks or so. She had many questions regarding this , but generally was reassured regarding the need for her complete her local treatment.   She is going to return to see me in early November. By that time she should be done with radiation and we can start routine long-term follow-up.  Rhonda Moss has a good understanding of the overall plan per she agrees with it. She knows a goal of treatment in her case is cure.

## 2015-07-31 NOTE — Progress Notes (Signed)
   Department of Radiation Oncology  Phone:  [phone] Fax:        [phone]   Name: Rhonda Moss MRN: [id number]  DOB: 10/25/1971  Date: 07/31/2015  Follow Up Visit Note  Diagnosis: Breast cancer of upper-outer quadrant of left female breast   Staging form: Breast, AJCC 7th Edition     Clinical: Stage Unknown (T1b, NX, M0) - Signed by Chauncey Cruel, MD on 04/29/2015     Pathologic stage from 06/26/2015: Stage IA (yT1b, N0, cM0) - Signed by Enid Cutter, MD on 07/11/2015       Staging comments: Staged on final surgical specimen by Dr. Donato Heinz   Interval History: Samariya presents today for routine followup.  She was on neoadjuvant tamoxifen for her sister's wedding and that went well. She had her lumpectomy on 8/5. This showed a 0.9 cm tumor with negative margins and 2 negative axillary lymph nodes. She has recovered well. She had a medium oncotype and elected not to take chemotherapy.   Physical Exam:  Filed Vitals:   07/31/15 1501  BP: 146/80  Pulse: 68  Temp: 98 F (36.7 C)  Weight: 274 lb 11.2 oz (124.603 kg)   Well healed incision in the left breast. Alert and oriented.   IMPRESSION: Rhonda Moss is a 44 y.o. female s/p lumpectomy now ready for radiation.   PLAN:  I spoke to the patient today regarding her diagnosis and options for treatment. We discussed the equivalence in terms of survival and local failure between mastectomy and breast conservation. We discussed the role of radiation in decreasing local failures in patients who undergo lumpectomy. We discussed the process of simulation and the placement tattoos. We discussed 4-6 weeks of treatment as an outpatient. (she is on the edge of having too large of breasts for hypofractionation) We discussed the possibility of asymptomatic lung damage. We discussed the low likelihood of secondary malignancies. We discussed the possible side effects including but not limited to skin redness, fatigue, permanent skin darkening, and breast  swelling.  We discussed the use of cardiac sparing with deep inspiration breath hold if needed.  She signed informed consent.  She was scheduled for simulation tomorrow.    Thea Silversmith, MD

## 2015-08-01 ENCOUNTER — Ambulatory Visit
Admission: RE | Admit: 2015-08-01 | Discharge: 2015-08-01 | Disposition: A | Discharge status: Home / Self Care | Payer: Medicaid Other | Source: Ambulatory Visit | Attending: Radiation Oncology | Admitting: Radiation Oncology

## 2015-08-01 DIAGNOSIS — C50412 Malignant neoplasm of upper-outer quadrant of left female breast: Secondary | ICD-10-CM | POA: Diagnosis not present

## 2015-08-01 DIAGNOSIS — Z510 Encounter for antineoplastic radiation therapy: Secondary | ICD-10-CM | POA: Diagnosis not present

## 2015-08-01 NOTE — Addendum Note (Signed)
Encounter addended by: Norm Salt, RN on: 08/01/2015 10:18 AM<BR>     Documentation filed: Charges VN

## 2015-08-01 NOTE — Progress Notes (Signed)
Name: TYLAR MERENDINO   MRN: [id number]  Date:  08/01/2015  DOB: [birth year]  Status:outpatient   DIAGNOSIS: Left Breast cancer.  CONSENT VERIFIED: yes SET UP: Patient is setup supine  IMMOBILIZATION:  The following immobilization was used:Custom Moldable Pillow, breast board.  NARRATIVE: Ms. Bose was brought to the [facility].  Identity was confirmed.  All relevant records and images related to the planned course of therapy were reviewed.  Then, the patient was positioned in a stable reproducible clinical set-up for radiation therapy.  Wires were placed to delineate the clinical extent of breast tissue. A wire was placed on the scar as well.  CT images were obtained.  An isocenter was placed. Skin markings were placed.  The position of the heart was then analyzed.  Acceptable cardiac sparing was achieved. The CT images were loaded into the planning software where the target and avoidance structures were contoured.  The radiation prescription was entered and confirmed. The patient was discharged in stable condition and tolerated simulation well.    TREATMENT PLANNING NOTE/3D Simulation Note Treatment planning then occurred. I have requested : MLC's, isodose plan, basic dose calculation  3D simulation was performed.  I personally designed and supervised the construction of 3 medically necessary complex treatment devices in the form of MLCs which will be used for beam modification and to protect critical structures including the heart and lung as well as the immobilization device which is necessary for reproducible set up.  I have requested a dose volume histogram of the heart, lung and tumor cavity.    This document serves as a record of services personally performed by Thea Silversmith, MD. It was created on her behalf by Arlyce Harman, a trained medical scribe. The creation of this record is based on the scribe's personal observations and the provider's statements to them.  This document has been checked and approved by the attending provider.    ------------------------------------------------  Thea Silversmith, MD

## 2015-08-08 DIAGNOSIS — Z510 Encounter for antineoplastic radiation therapy: Secondary | ICD-10-CM | POA: Diagnosis not present

## 2015-08-09 ENCOUNTER — Ambulatory Visit
Admission: RE | Admit: 2015-08-09 | Discharge: 2015-08-09 | Disposition: A | Discharge status: Home / Self Care | Payer: Medicaid Other | Source: Ambulatory Visit | Attending: Radiation Oncology | Admitting: Radiation Oncology

## 2015-08-09 DIAGNOSIS — Z510 Encounter for antineoplastic radiation therapy: Secondary | ICD-10-CM | POA: Diagnosis not present

## 2015-08-13 ENCOUNTER — Ambulatory Visit
Admission: RE | Admit: 2015-08-13 | Discharge: 2015-08-13 | Disposition: A | Discharge status: Home / Self Care | Payer: Medicaid Other | Source: Ambulatory Visit | Attending: Radiation Oncology | Admitting: Radiation Oncology

## 2015-08-13 DIAGNOSIS — Z510 Encounter for antineoplastic radiation therapy: Secondary | ICD-10-CM | POA: Diagnosis not present

## 2015-08-14 ENCOUNTER — Encounter: Payer: Self-pay | Admitting: Radiation Oncology

## 2015-08-14 ENCOUNTER — Ambulatory Visit
Admission: RE | Admit: 2015-08-14 | Discharge: 2015-08-14 | Disposition: A | Discharge status: Home / Self Care | Payer: Medicaid Other | Source: Ambulatory Visit | Attending: Radiation Oncology | Admitting: Radiation Oncology

## 2015-08-14 VITALS — BP 131/83 | HR 56 | Temp 98.1°F | Resp 16 | Wt 275.6 lb

## 2015-08-14 DIAGNOSIS — Z803 Family history of malignant neoplasm of breast: Secondary | ICD-10-CM | POA: Insufficient documentation

## 2015-08-14 DIAGNOSIS — Z170 Estrogen receptor positive status [ER+]: Secondary | ICD-10-CM | POA: Diagnosis not present

## 2015-08-14 DIAGNOSIS — C50412 Malignant neoplasm of upper-outer quadrant of left female breast: Secondary | ICD-10-CM | POA: Insufficient documentation

## 2015-08-14 DIAGNOSIS — Z79810 Long term (current) use of selective estrogen receptor modulators (SERMs): Secondary | ICD-10-CM | POA: Diagnosis not present

## 2015-08-14 MED ORDER — ALRA NON-METALLIC DEODORANT (RAD-ONC)
1.0000 "application " | Freq: Once | TOPICAL | Status: AC
  Administered 2015-08-14: 1 via TOPICAL

## 2015-08-14 MED ORDER — RADIAPLEXRX EX GEL
Freq: Once | CUTANEOUS | Status: AC
  Administered 2015-08-14: 13:00:00 via TOPICAL

## 2015-08-14 NOTE — Progress Notes (Signed)
Weekly Management Note Current Dose: 3.6  Gy  Projected Dose: 61 Gy   Narrative:  The patient presents for routine under treatment assessment.  CBCT/MVCT images/Port film x-rays were reviewed.  The chart was checked. Soing well. No complaints. RN education performed.   Physical Findings: Weight: 275 lb 9.6 oz (125.011 kg). Unchanged  Impression:  The patient is tolerating radiation.  Plan:  Continue treatment as planned. Start radiaplex.

## 2015-08-14 NOTE — Progress Notes (Signed)
Left breast rad txs 2 completed, Patient Education done, Radiation therapy and you book, Lluveras card, alra deodorant and radiaplex gel given, discussed ways to manage side effects, fatigue, skin irritation, swelling, tenderness, pain, increase protein in diet, stay hydrated,drink plenty water, no pain Verbal understanding, teach back method 12:55 PM BP 131/83 mmHg  Pulse 56  Temp(Src) 98.1 F (36.7 C) (Oral)  Resp 16  Wt 275 lb 9.6 oz (125.011 kg)  Wt Readings from Last 3 Encounters:  08/14/15 275 lb 9.6 oz (125.011 kg)  07/31/15 274 lb 11.2 oz (124.603 kg)  07/10/15 270 lb 12.8 oz (122.834 kg)

## 2015-08-15 ENCOUNTER — Ambulatory Visit
Admission: RE | Admit: 2015-08-15 | Discharge: 2015-08-15 | Disposition: A | Discharge status: Home / Self Care | Payer: Medicaid Other | Source: Ambulatory Visit | Attending: Radiation Oncology | Admitting: Radiation Oncology

## 2015-08-15 DIAGNOSIS — C50412 Malignant neoplasm of upper-outer quadrant of left female breast: Secondary | ICD-10-CM | POA: Diagnosis not present

## 2015-08-16 ENCOUNTER — Ambulatory Visit
Admission: RE | Admit: 2015-08-16 | Discharge: 2015-08-16 | Disposition: A | Discharge status: Home / Self Care | Payer: Medicaid Other | Source: Ambulatory Visit | Attending: Radiation Oncology | Admitting: Radiation Oncology

## 2015-08-16 DIAGNOSIS — C50412 Malignant neoplasm of upper-outer quadrant of left female breast: Secondary | ICD-10-CM | POA: Diagnosis not present

## 2015-08-17 ENCOUNTER — Ambulatory Visit
Admission: RE | Admit: 2015-08-17 | Discharge: 2015-08-17 | Disposition: A | Discharge status: Home / Self Care | Payer: Medicaid Other | Source: Ambulatory Visit | Attending: Radiation Oncology | Admitting: Radiation Oncology

## 2015-08-17 DIAGNOSIS — C50412 Malignant neoplasm of upper-outer quadrant of left female breast: Secondary | ICD-10-CM | POA: Diagnosis not present

## 2015-08-20 ENCOUNTER — Ambulatory Visit
Admission: RE | Admit: 2015-08-20 | Discharge: 2015-08-20 | Disposition: A | Discharge status: Home / Self Care | Payer: Medicaid Other | Source: Ambulatory Visit | Attending: Radiation Oncology | Admitting: Radiation Oncology

## 2015-08-20 ENCOUNTER — Telehealth: Payer: Self-pay | Admitting: *Deleted

## 2015-08-20 DIAGNOSIS — C50412 Malignant neoplasm of upper-outer quadrant of left female breast: Secondary | ICD-10-CM | POA: Diagnosis not present

## 2015-08-20 NOTE — Telephone Encounter (Signed)
Left vm for pt to return call regarding needs during xrt. Contact information given.

## 2015-08-21 ENCOUNTER — Ambulatory Visit
Admission: RE | Admit: 2015-08-21 | Discharge: 2015-08-21 | Disposition: A | Discharge status: Home / Self Care | Payer: Medicaid Other | Source: Ambulatory Visit | Attending: Radiation Oncology | Admitting: Radiation Oncology

## 2015-08-21 ENCOUNTER — Encounter: Payer: Self-pay | Admitting: Radiation Oncology

## 2015-08-21 VITALS — BP 135/82 | HR 70 | Temp 98.1°F | Resp 20 | Wt 277.6 lb

## 2015-08-21 DIAGNOSIS — C50412 Malignant neoplasm of upper-outer quadrant of left female breast: Secondary | ICD-10-CM | POA: Diagnosis not present

## 2015-08-21 NOTE — Progress Notes (Signed)
Weekly Management Note Current Dose: 12.6  Gy  Projected Dose: 61 Gy   Narrative:  The patient presents for routine under treatment assessment.  CBCT/MVCT images/Port film x-rays were reviewed.  The chart was checked. Working out. Gaining weight.   Physical Findings: Weight: 277 lb 9.6 oz (125.919 kg). Unchanged  Impression:  The patient is tolerating radiation.  Plan:  Continue treatment as planned. Continue radiaplex.

## 2015-08-21 NOTE — Progress Notes (Signed)
Weekly rad txs,  Left breast  7 completd, no skin changes noted, skin intact, using radiplex gel bid, appetrite good, exercising yoga and eliptical and core , drinking enough water , energy level good  12:38 PM BP 135/82 mmHg  Pulse 70  Temp(Src) 98.1 F (36.7 C) (Oral)  Resp 20  Wt 277 lb 9.6 oz (125.919 kg)  Wt Readings from Last 3 Encounters:  08/21/15 277 lb 9.6 oz (125.919 kg)  08/14/15 275 lb 9.6 oz (125.011 kg)  07/31/15 274 lb 11.2 oz (124.603 kg)

## 2015-08-22 ENCOUNTER — Ambulatory Visit
Admission: RE | Admit: 2015-08-22 | Discharge: 2015-08-22 | Disposition: A | Discharge status: Home / Self Care | Payer: Medicaid Other | Source: Ambulatory Visit | Attending: Radiation Oncology | Admitting: Radiation Oncology

## 2015-08-22 DIAGNOSIS — C50412 Malignant neoplasm of upper-outer quadrant of left female breast: Secondary | ICD-10-CM | POA: Diagnosis not present

## 2015-08-23 ENCOUNTER — Ambulatory Visit
Admission: RE | Admit: 2015-08-23 | Discharge: 2015-08-23 | Disposition: A | Discharge status: Home / Self Care | Payer: Medicaid Other | Source: Ambulatory Visit | Attending: Radiation Oncology | Admitting: Radiation Oncology

## 2015-08-23 DIAGNOSIS — C50412 Malignant neoplasm of upper-outer quadrant of left female breast: Secondary | ICD-10-CM | POA: Diagnosis not present

## 2015-08-24 ENCOUNTER — Ambulatory Visit
Admission: RE | Admit: 2015-08-24 | Discharge: 2015-08-24 | Disposition: A | Discharge status: Home / Self Care | Payer: Medicaid Other | Source: Ambulatory Visit | Attending: Radiation Oncology | Admitting: Radiation Oncology

## 2015-08-24 DIAGNOSIS — C50412 Malignant neoplasm of upper-outer quadrant of left female breast: Secondary | ICD-10-CM | POA: Diagnosis not present

## 2015-08-27 ENCOUNTER — Ambulatory Visit
Admission: RE | Admit: 2015-08-27 | Discharge: 2015-08-27 | Disposition: A | Discharge status: Home / Self Care | Payer: Medicaid Other | Source: Ambulatory Visit | Attending: Radiation Oncology | Admitting: Radiation Oncology

## 2015-08-27 DIAGNOSIS — C50412 Malignant neoplasm of upper-outer quadrant of left female breast: Secondary | ICD-10-CM | POA: Diagnosis not present

## 2015-08-28 ENCOUNTER — Ambulatory Visit
Admission: RE | Admit: 2015-08-28 | Discharge: 2015-08-28 | Disposition: A | Discharge status: Home / Self Care | Payer: Medicaid Other | Source: Ambulatory Visit | Attending: Radiation Oncology | Admitting: Radiation Oncology

## 2015-08-28 VITALS — BP 160/86 | HR 60 | Temp 98.4°F | Wt 276.5 lb

## 2015-08-28 DIAGNOSIS — C50412 Malignant neoplasm of upper-outer quadrant of left female breast: Secondary | ICD-10-CM | POA: Diagnosis not present

## 2015-08-28 NOTE — Progress Notes (Signed)
Weekly assessment of radiation to left breast.No skin changes.denies pain or any other problems.Continue application of radiaplex twice daily. BP 160/86 mmHg  Pulse 60  Temp(Src) 98.4 F (36.9 C)  Wt 276 lb 8 oz (125.42 kg)

## 2015-08-28 NOTE — Progress Notes (Signed)
Weekly Management Note Current Dose: 21.6  Gy  Projected Dose: 61 Gy   Narrative:  The patient presents for routine under treatment assessment.  CBCT/MVCT images/Port film x-rays were reviewed.  The chart was checked. Skin darker  Physical Findings: Weight: 276 lb 8 oz (125.42 kg). Minimal skin darkness.   Impression:  The patient is tolerating radiation.  Plan:  Continue treatment as planned. Continue radiaplex.

## 2015-08-29 ENCOUNTER — Ambulatory Visit
Admission: RE | Admit: 2015-08-29 | Discharge: 2015-08-29 | Disposition: A | Discharge status: Home / Self Care | Payer: Medicaid Other | Source: Ambulatory Visit | Attending: Radiation Oncology | Admitting: Radiation Oncology

## 2015-08-29 DIAGNOSIS — C50412 Malignant neoplasm of upper-outer quadrant of left female breast: Secondary | ICD-10-CM | POA: Diagnosis not present

## 2015-08-30 ENCOUNTER — Ambulatory Visit
Admission: RE | Admit: 2015-08-30 | Discharge: 2015-08-30 | Disposition: A | Discharge status: Home / Self Care | Payer: Medicaid Other | Source: Ambulatory Visit | Attending: Radiation Oncology | Admitting: Radiation Oncology

## 2015-08-30 DIAGNOSIS — C50412 Malignant neoplasm of upper-outer quadrant of left female breast: Secondary | ICD-10-CM | POA: Diagnosis not present

## 2015-08-31 ENCOUNTER — Ambulatory Visit
Admission: RE | Admit: 2015-08-31 | Discharge: 2015-08-31 | Disposition: A | Discharge status: Home / Self Care | Payer: Medicaid Other | Source: Ambulatory Visit | Attending: Radiation Oncology | Admitting: Radiation Oncology

## 2015-08-31 DIAGNOSIS — C50412 Malignant neoplasm of upper-outer quadrant of left female breast: Secondary | ICD-10-CM | POA: Diagnosis not present

## 2015-09-03 ENCOUNTER — Ambulatory Visit
Admission: RE | Admit: 2015-09-03 | Discharge: 2015-09-03 | Disposition: A | Discharge status: Home / Self Care | Payer: Medicaid Other | Source: Ambulatory Visit | Attending: Radiation Oncology | Admitting: Radiation Oncology

## 2015-09-03 DIAGNOSIS — C50412 Malignant neoplasm of upper-outer quadrant of left female breast: Secondary | ICD-10-CM | POA: Diagnosis not present

## 2015-09-04 ENCOUNTER — Ambulatory Visit
Admission: RE | Admit: 2015-09-04 | Discharge: 2015-09-04 | Disposition: A | Discharge status: Home / Self Care | Payer: Medicaid Other | Source: Ambulatory Visit | Attending: Radiation Oncology | Admitting: Radiation Oncology

## 2015-09-04 VITALS — BP 140/85 | HR 59 | Temp 98.2°F | Wt 276.2 lb

## 2015-09-04 DIAGNOSIS — C50412 Malignant neoplasm of upper-outer quadrant of left female breast: Secondary | ICD-10-CM

## 2015-09-04 NOTE — Progress Notes (Signed)
Weekly Management Note Current Dose: 21.6  Gy  Projected Dose: 61 Gy   Narrative:  The patient presents for routine under treatment assessment.  CBCT/MVCT images/Port film x-rays were reviewed.  The chart was checked. Skin darker  Physical Findings: Weight: 276 lb 3.2 oz (125.283 kg). Minimal skin darkness. Greater in axilla. Inframammary fold clean.   Impression:  The patient is tolerating radiation.  Plan:  Continue treatment as planned. Continue radiaplex.

## 2015-09-04 NOTE — Progress Notes (Signed)
Weekly under treat for left breast radiation.Completed 17 of 25 treatments.Denies pain.Marked hyperpigmentation greatest of left axilla without peeling.Continue application of radiaplex.

## 2015-09-05 ENCOUNTER — Ambulatory Visit
Admission: RE | Admit: 2015-09-05 | Discharge: 2015-09-05 | Disposition: A | Discharge status: Home / Self Care | Payer: Medicaid Other | Source: Ambulatory Visit | Attending: Radiation Oncology | Admitting: Radiation Oncology

## 2015-09-05 DIAGNOSIS — C50412 Malignant neoplasm of upper-outer quadrant of left female breast: Secondary | ICD-10-CM | POA: Diagnosis not present

## 2015-09-06 ENCOUNTER — Ambulatory Visit
Admission: RE | Admit: 2015-09-06 | Discharge: 2015-09-06 | Disposition: A | Discharge status: Home / Self Care | Payer: Medicaid Other | Source: Ambulatory Visit | Attending: Radiation Oncology | Admitting: Radiation Oncology

## 2015-09-06 DIAGNOSIS — C50412 Malignant neoplasm of upper-outer quadrant of left female breast: Secondary | ICD-10-CM | POA: Diagnosis not present

## 2015-09-07 ENCOUNTER — Ambulatory Visit
Admission: RE | Admit: 2015-09-07 | Discharge: 2015-09-07 | Disposition: A | Discharge status: Home / Self Care | Payer: Medicaid Other | Source: Ambulatory Visit | Attending: Radiation Oncology | Admitting: Radiation Oncology

## 2015-09-07 DIAGNOSIS — C50412 Malignant neoplasm of upper-outer quadrant of left female breast: Secondary | ICD-10-CM | POA: Diagnosis not present

## 2015-09-10 ENCOUNTER — Ambulatory Visit
Admission: RE | Admit: 2015-09-10 | Discharge: 2015-09-10 | Disposition: A | Discharge status: Home / Self Care | Payer: Medicaid Other | Source: Ambulatory Visit | Attending: Radiation Oncology | Admitting: Radiation Oncology

## 2015-09-10 DIAGNOSIS — C50412 Malignant neoplasm of upper-outer quadrant of left female breast: Secondary | ICD-10-CM | POA: Diagnosis not present

## 2015-09-11 ENCOUNTER — Ambulatory Visit
Admission: RE | Admit: 2015-09-11 | Discharge: 2015-09-11 | Disposition: A | Discharge status: Home / Self Care | Payer: Medicaid Other | Source: Ambulatory Visit | Attending: Radiation Oncology | Admitting: Radiation Oncology

## 2015-09-11 ENCOUNTER — Ambulatory Visit: Admission: RE | Admit: 2015-09-11 | Payer: Medicaid Other | Source: Ambulatory Visit | Admitting: Radiation Oncology

## 2015-09-11 ENCOUNTER — Encounter: Payer: Self-pay | Admitting: Radiation Oncology

## 2015-09-11 VITALS — BP 127/71 | HR 71 | Temp 98.1°F | Resp 20 | Wt 277.2 lb

## 2015-09-11 DIAGNOSIS — C50412 Malignant neoplasm of upper-outer quadrant of left female breast: Secondary | ICD-10-CM

## 2015-09-11 NOTE — Progress Notes (Signed)
Weekly Management Note Current Dose: 39.6  Gy  Projected Dose: 61 Gy   Narrative:  The patient presents for routine under treatment assessment.  CBCT/MVCT images/Port film x-rays were reviewed.  The chart was checked. Skin darker. More sensitivity over arm pit area.   Physical Findings: Weight: 277 lb 3.2 oz (125.737 kg). Minimal skin darkness. Greater in axilla. Inframammary fold clean.   Impression:  The patient is tolerating radiation.  Plan:  Continue treatment as planned. Continue radiaplex. Add NSAID for pain if needed.

## 2015-09-11 NOTE — Progress Notes (Signed)
Weekly rad txs left breast 22/33 completed, darkening of skin, intact, some itching stated, and rawness under axilla and under inframmary fold  She is using neosporin in those areas at night,, using radiaplex bid, appetite good, energy level, good ,  9:43 AM BP 127/71 mmHg  Pulse 71  Temp(Src) 98.1 F (36.7 C) (Oral)  Resp 20  Wt 277 lb 3.2 oz (125.737 kg)  Wt Readings from Last 3 Encounters:  09/11/15 277 lb 3.2 oz (125.737 kg)  09/04/15 276 lb 3.2 oz (125.283 kg)  08/28/15 276 lb 8 oz (125.42 kg)

## 2015-09-12 ENCOUNTER — Ambulatory Visit
Admission: RE | Admit: 2015-09-12 | Discharge: 2015-09-12 | Disposition: A | Discharge status: Home / Self Care | Payer: Medicaid Other | Source: Ambulatory Visit | Attending: Radiation Oncology | Admitting: Radiation Oncology

## 2015-09-12 ENCOUNTER — Ambulatory Visit: Payer: Medicaid Other | Admitting: Oncology

## 2015-09-12 DIAGNOSIS — C50412 Malignant neoplasm of upper-outer quadrant of left female breast: Secondary | ICD-10-CM | POA: Diagnosis not present

## 2015-09-13 ENCOUNTER — Ambulatory Visit
Admission: RE | Admit: 2015-09-13 | Discharge: 2015-09-13 | Disposition: A | Discharge status: Home / Self Care | Payer: Medicaid Other | Source: Ambulatory Visit | Attending: Radiation Oncology | Admitting: Radiation Oncology

## 2015-09-13 DIAGNOSIS — C50412 Malignant neoplasm of upper-outer quadrant of left female breast: Secondary | ICD-10-CM | POA: Diagnosis not present

## 2015-09-14 ENCOUNTER — Ambulatory Visit
Admission: RE | Admit: 2015-09-14 | Discharge: 2015-09-14 | Disposition: A | Discharge status: Home / Self Care | Payer: Medicaid Other | Source: Ambulatory Visit | Attending: Radiation Oncology | Admitting: Radiation Oncology

## 2015-09-14 DIAGNOSIS — C50412 Malignant neoplasm of upper-outer quadrant of left female breast: Secondary | ICD-10-CM | POA: Diagnosis not present

## 2015-09-17 ENCOUNTER — Ambulatory Visit
Admission: RE | Admit: 2015-09-17 | Discharge: 2015-09-17 | Disposition: A | Discharge status: Home / Self Care | Payer: Medicaid Other | Source: Ambulatory Visit | Attending: Radiation Oncology | Admitting: Radiation Oncology

## 2015-09-17 DIAGNOSIS — C50412 Malignant neoplasm of upper-outer quadrant of left female breast: Secondary | ICD-10-CM | POA: Diagnosis not present

## 2015-09-18 ENCOUNTER — Ambulatory Visit
Admission: RE | Admit: 2015-09-18 | Discharge: 2015-09-18 | Disposition: A | Discharge status: Home / Self Care | Payer: Medicaid Other | Source: Ambulatory Visit | Attending: Radiation Oncology | Admitting: Radiation Oncology

## 2015-09-18 ENCOUNTER — Encounter: Payer: Self-pay | Admitting: Radiation Oncology

## 2015-09-18 VITALS — BP 137/84 | HR 63 | Temp 97.4°F | Resp 16 | Wt 274.3 lb

## 2015-09-18 DIAGNOSIS — Z170 Estrogen receptor positive status [ER+]: Secondary | ICD-10-CM | POA: Diagnosis not present

## 2015-09-18 DIAGNOSIS — C50412 Malignant neoplasm of upper-outer quadrant of left female breast: Secondary | ICD-10-CM

## 2015-09-18 DIAGNOSIS — Z803 Family history of malignant neoplasm of breast: Secondary | ICD-10-CM | POA: Diagnosis not present

## 2015-09-18 DIAGNOSIS — Z79810 Long term (current) use of selective estrogen receptor modulators (SERMs): Secondary | ICD-10-CM | POA: Diagnosis not present

## 2015-09-18 MED ORDER — RADIAPLEXRX EX GEL: Freq: Once | CUTANEOUS | Status: DC

## 2015-09-18 NOTE — Progress Notes (Signed)
Weekly rad txs left breast , darkening and skin has broken  Moist desquamation,  Since Sunday, and very painful, using neosporin that area, needs another radiaplex, using aquaphor under axilla and under inframmary fold, also needs refill on percocet, appetite good BP 137/84 mmHg  Pulse 63  Temp(Src) 97.4 F (36.3 C) (Oral)  Resp 16  Wt 274 lb 4.8 oz (124.422 kg)  Wt Readings from Last 3 Encounters:  09/18/15 274 lb 4.8 oz (124.422 kg)  09/11/15 277 lb 3.2 oz (125.737 kg)  09/04/15 276 lb 3.2 oz (125.283 kg)

## 2015-09-18 NOTE — Progress Notes (Signed)
Weekly Management Note Current Dose: 48.6 Gy  Projected Dose: 61 Gy   Narrative:  The patient presents for routine under treatment assessment.  CBCT/MVCT images/Port film x-rays were reviewed.  The chart was checked. Open painful area in axilla.   Physical Findings: Weight: 274 lb 4.8 oz (124.422 kg). Skin is dark. Moist desquamation in axillary fold. Inframammary fold clean.   Impression:  The patient is tolerating radiation.  Plan:  Continue treatment as planned. Continue radiaplex. Gentian violet to axilla.

## 2015-09-18 NOTE — Progress Notes (Signed)
Gentian Violet applied to breast area where skin is broken, tlfa pad applied over site and gave mesh tube top to hold in place, pateint tolerated well, just slight stinging stated by patient 10:30 AM

## 2015-09-19 ENCOUNTER — Ambulatory Visit
Admission: RE | Admit: 2015-09-19 | Discharge: 2015-09-19 | Disposition: A | Discharge status: Home / Self Care | Payer: Medicaid Other | Source: Ambulatory Visit | Attending: Radiation Oncology | Admitting: Radiation Oncology

## 2015-09-19 DIAGNOSIS — C50412 Malignant neoplasm of upper-outer quadrant of left female breast: Secondary | ICD-10-CM | POA: Diagnosis not present

## 2015-09-20 ENCOUNTER — Ambulatory Visit
Admission: RE | Admit: 2015-09-20 | Discharge: 2015-09-20 | Disposition: A | Discharge status: Home / Self Care | Payer: Medicaid Other | Source: Ambulatory Visit | Attending: Radiation Oncology | Admitting: Radiation Oncology

## 2015-09-20 ENCOUNTER — Encounter: Payer: Self-pay | Admitting: *Deleted

## 2015-09-20 DIAGNOSIS — C50412 Malignant neoplasm of upper-outer quadrant of left female breast: Secondary | ICD-10-CM | POA: Diagnosis not present

## 2015-09-20 NOTE — Progress Notes (Signed)
Pt arrived to nursing unit requesting application of Gentian Violet topical solution 1%.  Noted an approximate skin wound of moist desquamation to left upper breast area.  Applied a thin layer of Gentian Violet solution, allowed to dry, covered with a Telfa pad and ABD pad for comfort.  Secured in place with mess.

## 2015-09-21 ENCOUNTER — Ambulatory Visit
Admission: RE | Admit: 2015-09-21 | Discharge: 2015-09-21 | Disposition: A | Discharge status: Home / Self Care | Payer: Medicaid Other | Source: Ambulatory Visit | Attending: Radiation Oncology | Admitting: Radiation Oncology

## 2015-09-21 DIAGNOSIS — C50412 Malignant neoplasm of upper-outer quadrant of left female breast: Secondary | ICD-10-CM | POA: Diagnosis not present

## 2015-09-24 ENCOUNTER — Ambulatory Visit
Admission: RE | Admit: 2015-09-24 | Discharge: 2015-09-24 | Disposition: A | Discharge status: Home / Self Care | Payer: Medicaid Other | Source: Ambulatory Visit | Attending: Radiation Oncology | Admitting: Radiation Oncology

## 2015-09-24 DIAGNOSIS — C50412 Malignant neoplasm of upper-outer quadrant of left female breast: Secondary | ICD-10-CM | POA: Diagnosis not present

## 2015-09-24 NOTE — Progress Notes (Signed)
Patient has moist desquamation on lateral side left breast, Moved from one home to another this weekend and more skin breakdown, painted gentian violet topical solution to site left  Upper breast, allowed to dry, 4x4 gause and abdominal pad over site,gave anothr mesh tube top secured in place, patient has 2 more treatments, patient tolerated well, says "this helps a lot' 10:09 AM

## 2015-09-25 ENCOUNTER — Ambulatory Visit
Admission: RE | Admit: 2015-09-25 | Discharge: 2015-09-25 | Disposition: A | Discharge status: Home / Self Care | Payer: Medicaid Other | Source: Ambulatory Visit | Attending: Radiation Oncology | Admitting: Radiation Oncology

## 2015-09-25 VITALS — BP 135/74 | HR 62 | Resp 16 | Wt 272.2 lb

## 2015-09-25 DIAGNOSIS — C50412 Malignant neoplasm of upper-outer quadrant of left female breast: Secondary | ICD-10-CM

## 2015-09-25 NOTE — Progress Notes (Signed)
Weight and vitals stable. Denies pain. Moist desquamation of left upper breast noted. Dry desquamation of left mammary fold noted. Reports fatigue.   BP 135/74 mmHg  Pulse 62  Resp 16  Wt 272 lb 3.2 oz (123.469 kg) Wt Readings from Last 3 Encounters:  09/25/15 272 lb 3.2 oz (123.469 kg)  09/18/15 274 lb 4.8 oz (124.422 kg)  09/11/15 277 lb 3.2 oz (125.737 kg)

## 2015-09-25 NOTE — Progress Notes (Signed)
  Radiation Oncology         [phone] ________________________________  Name: Rhonda Moss MRN: [id number]  Date: 09/25/2015  DOB: 1971-04-25  End of Treatment Note  Diagnosis:   Breast cancer of upper-outer quadrant of left female breast ([place])   Staging form: Breast, AJCC 7th Edition     Clinical: Stage Unknown (T1b, NX, M0) - Signed by Chauncey Cruel, MD on 04/29/2015     Pathologic stage from 06/26/2015: Stage IA (yT1b, N0, cM0) - Signed by Enid Cutter, MD on 07/11/2015       Staging comments: Staged on final surgical specimen by Dr. Donato Heinz   Indication for treatment:  Curative      Radiation treatment dates:   08/13/15-09/25/15  Site/dose:   Left breast/ 45 Gy at 1.8 Gy per fraction x 25 fractions.  Left breast boost/ 14 Gy at 2 Gy per fraction x 8 fractions  Beams/energy:  Opposed tangents with reduced fields / 10 MV photons Wedge photon pair with 10 MV photons  Narrative: The patient tolerated radiation treatment relatively well.   She had moist desquamation in the axilla close to her lumpectomy incision. Her boost was truncated early due to this. The moist desquamation was treated with genetian violet.   Plan: The patient has completed radiation treatment. The patient will return to radiation oncology clinic for routine followup in 2 weeks. She will call the nurses earlier if she needs reapplication or has questions. I advised them to call or return sooner if they have any questions or concerns related to their recovery or treatment.  ------------------------------------------------  Thea Silversmith, MD

## 2015-09-26 ENCOUNTER — Ambulatory Visit
Admission: RE | Admit: 2015-09-26 | Discharge: 2015-09-26 | Disposition: A | Discharge status: Home / Self Care | Payer: Medicaid Other | Source: Ambulatory Visit | Attending: Radiation Oncology | Admitting: Radiation Oncology

## 2015-09-26 DIAGNOSIS — C50412 Malignant neoplasm of upper-outer quadrant of left female breast: Secondary | ICD-10-CM

## 2015-09-27 ENCOUNTER — Telehealth: Payer: Self-pay | Admitting: *Deleted

## 2015-09-27 NOTE — Telephone Encounter (Signed)
Spoke to pt to congratulate on completion of xrt and to assess needs. Relate doing well and without complaints. Discussed survivorship program and to be expecting a call to schedule an appt. Encourage pt to call with needs or questions. Received verbal understanding.

## 2015-10-01 ENCOUNTER — Other Ambulatory Visit: Payer: Self-pay | Admitting: Adult Health

## 2015-10-01 DIAGNOSIS — C50412 Malignant neoplasm of upper-outer quadrant of left female breast: Secondary | ICD-10-CM

## 2015-10-02 ENCOUNTER — Telehealth: Payer: Self-pay | Admitting: Oncology

## 2015-10-02 NOTE — Telephone Encounter (Signed)
Left message for patient re SCP visit for 11/23/2015 and asked that patient get copy of and more info re appointment at her f/u appointment this week on 11/17.

## 2015-10-03 ENCOUNTER — Other Ambulatory Visit: Payer: Self-pay

## 2015-10-03 DIAGNOSIS — C50412 Malignant neoplasm of upper-outer quadrant of left female breast: Secondary | ICD-10-CM

## 2015-10-04 ENCOUNTER — Other Ambulatory Visit (HOSPITAL_BASED_OUTPATIENT_CLINIC_OR_DEPARTMENT_OTHER): Payer: Medicaid Other

## 2015-10-04 ENCOUNTER — Ambulatory Visit (HOSPITAL_BASED_OUTPATIENT_CLINIC_OR_DEPARTMENT_OTHER): Payer: Medicaid Other | Admitting: Oncology

## 2015-10-04 ENCOUNTER — Telehealth: Payer: Self-pay | Admitting: Oncology

## 2015-10-04 VITALS — BP 131/78 | HR 69 | Temp 98.4°F | Resp 18 | Ht 67.0 in | Wt 276.5 lb

## 2015-10-04 DIAGNOSIS — C50412 Malignant neoplasm of upper-outer quadrant of left female breast: Secondary | ICD-10-CM | POA: Diagnosis not present

## 2015-10-04 LAB — CBC WITH DIFFERENTIAL/PLATELET
BASO%: 0.3 % (ref 0.0–2.0)
Basophils Absolute: 0 10*3/uL (ref 0.0–0.1)
EOS%: 0.7 % (ref 0.0–7.0)
Eosinophils Absolute: 0 10*3/uL (ref 0.0–0.5)
HEMATOCRIT: 30.9 % — AB (ref 34.8–46.6)
HEMOGLOBIN: 10.1 g/dL — AB (ref 11.6–15.9)
LYMPH#: 1.1 10*3/uL (ref 0.9–3.3)
LYMPH%: 20.9 % (ref 14.0–49.7)
MCH: 29.1 pg (ref 25.1–34.0)
MCHC: 32.7 g/dL (ref 31.5–36.0)
MCV: 88.8 fL (ref 79.5–101.0)
MONO#: 0.3 10*3/uL (ref 0.1–0.9)
MONO%: 6 % (ref 0.0–14.0)
NEUT%: 72.1 % (ref 38.4–76.8)
NEUTROS ABS: 3.7 10*3/uL (ref 1.5–6.5)
Platelets: 263 10*3/uL (ref 145–400)
RBC: 3.48 10*6/uL — ABNORMAL LOW (ref 3.70–5.45)
RDW: 14.6 % — AB (ref 11.2–14.5)
WBC: 5.1 10*3/uL (ref 3.9–10.3)

## 2015-10-04 LAB — COMPREHENSIVE METABOLIC PANEL (CC13)
ALT: 14 U/L (ref 0–55)
AST: 21 U/L (ref 5–34)
Albumin: 3.2 g/dL — ABNORMAL LOW (ref 3.5–5.0)
Alkaline Phosphatase: 70 U/L (ref 40–150)
Anion Gap: 5 mEq/L (ref 3–11)
BILIRUBIN TOTAL: 0.38 mg/dL (ref 0.20–1.20)
BUN: 11.4 mg/dL (ref 7.0–26.0)
CALCIUM: 8.9 mg/dL (ref 8.4–10.4)
CO2: 26 mEq/L (ref 22–29)
CREATININE: 1.2 mg/dL — AB (ref 0.6–1.1)
Chloride: 110 mEq/L — ABNORMAL HIGH (ref 98–109)
EGFR: 63 mL/min/{1.73_m2} — ABNORMAL LOW (ref >90)
Glucose: 102 mg/dl (ref 70–140)
Potassium: 4.2 mEq/L (ref 3.5–5.1)
Sodium: 141 mEq/L (ref 136–145)
TOTAL PROTEIN: 6.8 g/dL (ref 6.4–8.3)

## 2015-10-04 MED ORDER — OXYCODONE-ACETAMINOPHEN 5-325 MG PO TABS: 1.0000 | ORAL_TABLET | Freq: Every evening | ORAL | Status: DC | PRN

## 2015-10-04 NOTE — Telephone Encounter (Signed)
Appointments made and avs printed for patient °

## 2015-10-04 NOTE — Progress Notes (Signed)
[name]  [phone] [phone]     ID: Rhonda Moss DOB: 1971-04-07  MR#: [id number]  WVP#:[id number]  Patient Care Team: Frederico Hamman, MD as PCP - General (Obstetrics and Gynecology) Chauncey Cruel, MD as Consulting Physician (Oncology) PCP: Frederico Hamman, MD GYN: SU: Coralie Keens, MD OTHER MD: Thea Silversmith M.D.  CHIEF COMPLAINT: Estrogen receptor positive left breast cancer  CURRENT TREATMENT:  Tamoxifen   BREAST CANCER HISTORY: From the original intake note:  The patient had routine bilateral screening mammography at the breast center 03/13/2015 showing a possible mass in the left breast. Left diagnostic mammography with tomosynthesis and left breast ultrasonography 04/04/2015 found the breast density to be category C area did in the left breast upper outer quadrant there was a persistent mass which by ultrasound was irregular and hypoechoic. It measured 0.7 cm. In the left axilla there was a normal size lymph node with eccentric cortical thickening measuring 4 mm in maximal thickness.  On 04/11/2015 the patient underwent biopsy of the left breast mass in question and this showed ([id number]) and invasive lobular carcinoma, E-cadherin negative, grade 1 or 2, estrogen receptor 99% positive, progesterone receptor 90% positive, both with strong staining intensity, with an MIB-1 of 19%. HER-2 was not amplified, the signals ratio being 1.09 and the number per cell 1.90.  The patient's case was presented at the multidisciplinary breast cancer conference 04/18/2015. At that time it was recommended that the patient be referred to genetics and undergo breast MRI.   Her subsequent history is as detailed below  INTERVAL HISTORY: Rhonda Moss returns today for follow-up of her estrogen receptor positive breast cancer.  In the interval since her last visit she has completed her radiation treatments. She had significant desquamation and is  still having quite a bit of pain, which limits her ability to do workouts. She is taking Norco at bedtime and that is helping. This is not constipating her. On the plus side she was never terribly fatigued from the treatments. She continued to work full-time.  She continues on tamoxifen with good tolerance. She obtains the drug at a very good price. She is not having hot flashes, or other side effects. Her periods have become irregular, with the most recent one, a rather heavy 1, 2 months ago. She is not on effective contraception at this point  REVIEW OF SYSTEMS: A detailed review of systems today was otherwise stable.  PAST MEDICAL HISTORY: Past Medical History  Diagnosis Date  . Ectopic pregnancy   . Family history of breast cancer   . Family history of kidney cancer   . Colon polyps   . Breast cancer ([facility]) dx. 44 04/11/15    ILC of left breast  . Allergy   . Anxiety     PAST SURGICAL HISTORY: Past Surgical History  Procedure Laterality Date  . Lymphadenectomy  1992  . Tubal ligation  2009  . Cervical biopsy  w/ loop electrode excision    . Cholecystectomy    . Cesarean section    . Breast lumpectomy with radioactive seed and sentinel lymph node biopsy Left 06/22/2015    Procedure: LEFT BREAST LUMPECTOMY WITH RADIOACTIVE SEED AND SENTINEL LYMPH NODE BIOPSY;  Surgeon: Coralie Keens, MD;  Location: [facility];  Service: General;  Laterality: Left;    FAMILY HISTORY Family History  Problem Relation Age of Onset  . Breast cancer Mother 20  . Breast cancer Sister 35  . Breast cancer Maternal  Aunt 60    +calcifications  . Prostate cancer Maternal Uncle 67  . Heart attack Maternal Grandmother   . Heart attack Maternal Grandfather   . Prostate cancer Maternal Grandfather 65  . Diabetes Paternal Grandmother   . Pancreatitis Paternal Grandfather   . Prostate cancer Cousin     dx. late 29s  the patient's parents are living, in their late 22s. The patient has one  brother, and one sister. The patient's mother was diagnosed with breast cancer at age 53 and  One of the patient's mother's 2 sisters was also diagnosed with breast cancer at age 21. The patient's on sister was diagnosed with breast cancer at age 41. She was tested for the BRCA gene mutations and was negative.  GYNECOLOGIC HISTORY:  No LMP recorded. Menarche age 63 first live birth age 67. The patient is GX P2. Her periods are regular, lasting between 5 and 7 days. They're now slightly better controlled on a NuvaRing IUD.   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 Her job involves a great deal of computer work and some driving, occasionally some walking. At home is just her and her son is a Span, currently 15 years old. The patient's daughter Rhonda Moss, 20, also sometimes stays at home. She is studying sports medicine. The patient's significant other, Rhonda Moss, a works for [employer].    ADVANCED DIRECTIVES: not in place; Not in place. At the initial clinic visit 10/04/2015 the patient was given the appropriate forms to complete and notarize at her discretion.  HEALTH MAINTENANCE: Social History  Substance Use Topics  . Smoking status: Never Smoker   . Smokeless tobacco: Not on file  . Alcohol Use: 0.0 oz/week    0 Standard drinks or equivalent per week     Comment: social     Colonoscopy:  PAP: NOV 2015  Bone density:  Lipid panel:  Allergies  Allergen Reactions  . Sulfa Antibiotics Hives    Current Outpatient Prescriptions  Medication Sig Dispense Refill  . ALPRAZolam (XANAX) 0.5 MG tablet Take 0.5 mg by mouth at bedtime as needed for anxiety.    . cetirizine (ZYRTEC) 10 MG tablet Take 10 mg by mouth daily.    . hyaluronate sodium (RADIAPLEXRX) GEL Apply 1 application topically 2 (two) times daily.    Marland Kitchen oxyCODONE-acetaminophen (PERCOCET/ROXICET) 5-325 MG tablet Take 1 tablet by mouth at bedtime as needed for severe pain. 10 tablet 0  .  tamoxifen (NOLVADEX) 20 MG tablet Take 1 tablet (20 mg total) by mouth daily. (Patient not taking: Reported on 08/28/2015) 90 tablet 4   No current facility-administered medications for this visit.    OBJECTIVE: Middle-aged African-American woman who appears stated age 46 Vitals:   10/04/15 1427  BP: 131/78  Pulse: 69  Temp: 98.4 F (36.9 C)  Resp: 18     Body mass index is 43.3 kg/(m^2).    ECOG FS:1 - Symptomatic but completely ambulatory  Sclerae unicteric, pupils round and equal Oropharynx clear and moist-- no thrush or other lesions No cervical or supraclavicular adenopathy Lungs no rales or rhonchi Heart regular rate and rhythm Abd soft, nontender, positive bowel sounds MSK no focal spinal tenderness, no upper extremity lymphedema Neuro: nonfocal, well oriented, appropriate affect Breasts: The right breast is unremarkable. The left breast is status post recent radiation. There is still significant dry desquamation particularly in the inframammary fold, superiorly, and in the left axilla. I do not see wet desquamation. There is  no evidence of local recurrence. The left axilla is otherwise benign.      LAB RESULTS:  CMP     Component Value Date/Time   NA 141 10/04/2015 1408   NA 138 12/03/2014 1221   K 4.2 10/04/2015 1408   K 3.9 12/03/2014 1221   CL 106 12/03/2014 1221   CO2 26 10/04/2015 1408   CO2 22 12/03/2014 1221   GLUCOSE 102 10/04/2015 1408   GLUCOSE 109* 12/03/2014 1221   BUN 11.4 10/04/2015 1408   BUN 7 12/03/2014 1221   CREATININE 1.2* 10/04/2015 1408   CREATININE 1.06 12/03/2014 1221   CALCIUM 8.9 10/04/2015 1408   CALCIUM 9.0 12/03/2014 1221   PROT 6.8 10/04/2015 1408   PROT 7.4 12/03/2014 1221   ALBUMIN 3.2* 10/04/2015 1408   ALBUMIN 4.0 12/03/2014 1221   AST 21 10/04/2015 1408   AST 24 12/03/2014 1221   ALT 14 10/04/2015 1408   ALT 14 12/03/2014 1221   ALKPHOS 70 10/04/2015 1408   ALKPHOS 80 12/03/2014 1221   BILITOT 0.38 10/04/2015  1408   BILITOT 1.1 12/03/2014 1221   GFRNONAA 63* 12/03/2014 1221   GFRAA 73* 12/03/2014 1221    INo results found for: SPEP, UPEP  Lab Results  Component Value Date   WBC 5.1 10/04/2015   NEUTROABS 3.7 10/04/2015   HGB 10.1* 10/04/2015   HCT 30.9* 10/04/2015   MCV 88.8 10/04/2015   PLT 263 10/04/2015      Chemistry      Component Value Date/Time   NA 141 10/04/2015 1408   NA 138 12/03/2014 1221   K 4.2 10/04/2015 1408   K 3.9 12/03/2014 1221   CL 106 12/03/2014 1221   CO2 26 10/04/2015 1408   CO2 22 12/03/2014 1221   BUN 11.4 10/04/2015 1408   BUN 7 12/03/2014 1221   CREATININE 1.2* 10/04/2015 1408   CREATININE 1.06 12/03/2014 1221      Component Value Date/Time   CALCIUM 8.9 10/04/2015 1408   CALCIUM 9.0 12/03/2014 1221   ALKPHOS 70 10/04/2015 1408   ALKPHOS 80 12/03/2014 1221   AST 21 10/04/2015 1408   AST 24 12/03/2014 1221   ALT 14 10/04/2015 1408   ALT 14 12/03/2014 1221   BILITOT 0.38 10/04/2015 1408   BILITOT 1.1 12/03/2014 1221       No results found for: LABCA2  No components found for: LABCA125  No results for input(s): INR in the last 168 hours.  Urinalysis    Component Value Date/Time   COLORURINE YELLOW 12/03/2014 1415   APPEARANCEUR CLEAR 12/03/2014 1415   LABSPEC 1.024 12/03/2014 1415   PHURINE 5.5 12/03/2014 1415   GLUCOSEU NEGATIVE 12/03/2014 1415   HGBUR NEGATIVE 12/03/2014 1415   BILIRUBINUR NEGATIVE 12/03/2014 1415   KETONESUR 15* 12/03/2014 1415   PROTEINUR NEGATIVE 12/03/2014 1415   UROBILINOGEN 0.2 12/03/2014 1415   NITRITE NEGATIVE 12/03/2014 1415   LEUKOCYTESUR NEGATIVE 12/03/2014 1415    STUDIES: No results found.  ASSESSMENT: 44 y.[place]. [place] status post left breast biopsy 04/11/2015 for a clinical T1b NX invasive lobular breast cancer, grade 1 or 2, strongly estrogen and progesterone receptor positive, HER-2 not amplified, with an MIB-1 of 19%.  (1) genetics testing 05/04/2015 through the  [place] panel/ [facility] showed no deleterious mutations in ATM, BARD1, BRCA1, BRCA2, BRIP1, CDH1, CHEK2, FANCC, MLH1, MSH2, MSH6, NBN, PALB2, PMS2, PTEN, RAD51C, RAD51D, STK11, TP53, and XRCC2. Furthermore, deletion/duplication analysis (without next-generation sequencing) was performed for the  EPCAM gene.   (2) status post left lumpectomy and sentinel lymph node sampling 06/22/2015 for a pT1b pN0, stage iA invasive lobular breast cancer, grade 1, repeat HER-2 again negative, with ample margins   (3)  Continuing (neo)adjuvant tamoxifen, started June 2016  (4) Oncotype DX score of 22 predicts a risk of recurrence outside the breast within the next 10 years of 14% if the patient's only systemic treatment is tamoxifen for 5 years.   (a)  The patient opted to forego chemotherapy given the very marginal anticipated benefit  (5)  Adjuvant radiation 08/13/15-09/25/15:  Left breast/ 45 Gy at 1.8 Gy per fraction x 25 fractions.  Left breast boost/ 14 Gy at 2 Gy per fraction x 8 fractions  PLAN: Rachna is finished with her local treatment for breast cancer and is continuing the tamoxifen, which will be her systemic therapy. We discussed the difference between tamoxifen and anastrozole and she understands until she becomes postmenopausal, she would not be able to switch to anastrozole.  We discussed the fact that tamoxifen is not a contraceptive and we strongly discouraged people from getting pregnant while on tamoxifen. The effects on the baby are basically unknown. We discussed the use of a Mirena IUD which works very well with tamoxifen, as it tends to prevent or minimize the thickening of the endometrial stripe that can be troublesome with this drug. We also discussed goserelin. At this point what Twanda wants to do is suggest that her husband undergo a vasectomy and that would certainly take care of the problem.  The plan then is to continue tamoxifen for at least 5 years and  possibly 10, or to switch to anastrozole for a minimum of 2 years of anastrozole after 3 years or more of tamoxifen, but only once Kennady is conventionally menopause.   She will see [place] again in 3 months. I have encouraged her to continue her walking program and move on to the more intense exercise program she had previously as soon as she starts feeling better. She knows to call for any other issues that may develop before her next visit here.  Chauncey Cruel, MD   10/04/2015 3:11 PM Medical Oncology and Hematology [facility] [address] [place], [place] [address] Tel. [phone]    Fax. [phone]

## 2015-10-07 NOTE — Progress Notes (Signed)
Radiation Oncology         [phone] ________________________________  Name: Rhonda Moss      MRN: [id number]          Date: 09/26/2015              DOB: 07/04/1971  Optical Surface Tracking Plan:  Since intensity modulated radiotherapy (IMRT) and 3D conformal radiation treatment methods are predicated on accurate and precise positioning for treatment, intrafraction motion monitoring is medically necessary to ensure accurate and safe treatment delivery.  The ability to quantify intrafraction motion without excessive ionizing radiation dose can only be performed with optical surface tracking. Accordingly, surface imaging offers the opportunity to obtain 3D measurements of patient position throughout IMRT and 3D treatments without excessive radiation exposure.  I am ordering optical surface tracking for this patient's upcoming course of radiotherapy. ________________________________ Signature   Reference:   Ursula Alert, J, et al. Surface imaging-based analysis of intrafraction motion for breast radiotherapy patients.Journal of [place], n. 6, nov. 2014. ISSN [id number].   Available at: <http://www.jacmp.org/index.php/jacmp/article/view/4957>.

## 2015-10-07 NOTE — Progress Notes (Signed)
   Department of Radiation Oncology  Phone:  [phone] Fax:        [phone]   Name: Rhonda Moss MRN: [id number]  DOB: 1971-10-18  Date: 10/09/2015  Follow Up Visit Note  Diagnosis: Breast cancer of upper-outer quadrant of left female breast [facility])   Staging form: Breast, AJCC 7th Edition     Clinical: Stage Unknown (T1b, NX, M0) - Signed by Chauncey Cruel, MD on 04/29/2015     Pathologic stage from 06/26/2015: Stage IA (yT1b, N0, cM0) - Signed by Enid Cutter, MD on 07/11/2015       Staging comments: Staged on final surgical specimen by Dr. Donato Heinz  Summary and Interval since last radiation:  08/13/15-09/25/15 Site/dose:   Left breast/ 45 Gy at 1.8 Gy per fraction x 25 fractions.  Left breast boost/ 14 Gy at 2 Gy per fraction x 8 fractions  Interval History: Rhonda Moss presents today for routine followup.  She is heling well although that area is still draining some.  It still is painful. She is using Genetian violet at home. She has started tamoxifen and has an appointment with survivorship scheduled.   Physical Exam:  Filed Vitals:   10/09/15 1137  BP: 146/82  Pulse: 72  Temp: 98.2 F (36.8 C)  TempSrc: Oral  Height: 5' 7"  (1.702 m)  Weight: 273 lb (123.832 kg)  SpO2: 100%   Area of moist desquamation and hypopigmentation in the left axilla.   IMPRESSION: Rhonda Moss is a 44 y.o. female s/p   PLAN:  She is doing well. We discussed the need for follow up every 4-6 months which she has scheduled.  We discussed the need for yearly mammograms which she can schedule with her OBGYN or with medical oncology. We discussed the need for sun protection in the treated area.  She can always call me with questions.  I will follow up with her in 2 weeks to ensure this has healed.      Thea Silversmith, MD

## 2015-10-09 ENCOUNTER — Encounter: Payer: Self-pay | Admitting: Radiation Oncology

## 2015-10-09 ENCOUNTER — Ambulatory Visit
Admission: RE | Admit: 2015-10-09 | Discharge: 2015-10-09 | Disposition: A | Discharge status: Home / Self Care | Payer: Medicaid Other | Source: Ambulatory Visit | Attending: Radiation Oncology | Admitting: Radiation Oncology

## 2015-10-09 VITALS — BP 146/82 | HR 72 | Temp 98.2°F | Ht 67.0 in | Wt 273.0 lb

## 2015-10-09 DIAGNOSIS — C50412 Malignant neoplasm of upper-outer quadrant of left female breast: Secondary | ICD-10-CM

## 2015-10-09 NOTE — Progress Notes (Signed)
Rhonda Moss is here today for F/U visit. Skin dark hyperpigmentation to left breast, upper outer area still has some weeping, still using genetian violet to left breast.   Energy level is good.  BP 146/82 mmHg  Pulse 72  Temp(Src) 98.2 F (36.8 C) (Oral)  Wt Readings from Last 3 Encounters:  10/04/15 276 lb 8 oz (125.42 kg)  09/24/15 273 lb 3.2 oz (123.923 kg)  09/25/15 272 lb 3.2 oz (123.469 kg)

## 2015-11-02 ENCOUNTER — Ambulatory Visit: Payer: Medicaid Other | Admitting: Radiation Oncology

## 2015-11-15 NOTE — Progress Notes (Signed)
   Department of Radiation Oncology  Phone:  [phone] Fax:        [phone]   Name: LEANORE BIGGERS MRN: [id number]  DOB: November 29, 1970  Date: 11/16/2015  Follow Up Visit Note  Diagnosis: Breast cancer of upper-outer quadrant of left female breast [facility])   Staging form: Breast, AJCC 7th Edition     Clinical: Stage Unknown (T1b, NX, M0) - Signed by Chauncey Cruel, MD on 04/29/2015     Pathologic stage from 06/26/2015: Stage IA (yT1b, N0, cM0) - Signed by Enid Cutter, MD on 07/11/2015       Staging comments: Staged on final surgical specimen by Dr. Donato Heinz  Summary and Interval since last radiation:  08/13/15-09/25/15 Site/dose:   Left breast/ 45 Gy at 1.8 Gy per fraction x 25 fractions.  Left breast boost/ 14 Gy at 2 Gy per fraction x 8 fractions  Interval History: Ileanna presents today for routine followup. She is taking Tamoxifen. Her skin finally healed up. Her fiancee got her a ring for Christmas and she is planning an October wedding. She has a survivorship visit in January. Her friends are going through breast cancer treatment.   Physical Exam:  Hypopigmentation of the outer right breast. Mild hyper pigmentation of the outer right breast as well through the axilla. Inframammary fold is clean.   IMPRESSION: Casady is a 44 y.o. female with stage I right breast cancer s/p radiation.  PLAN:  She is doing well. We discussed the need for follow up every 4-6 months which she has scheduled.  We discussed the need for yearly mammograms which she can schedule with her OBGYN or with medical oncology. We discussed the need for sun protection in the treated area.  She can always call me with questions.  She has an appointment with survivorship on 11/23/15 and will see Dr. Jana Hakim on 01/09/16.  Thea Silversmith, MD  This document serves as a record of services personally performed by Thea Silversmith, MD. It was created on her behalf by Darcus Austin, a trained medical scribe. The creation of this  record is based on the scribe's personal observations and the provider's statements to them. This document has been checked and approved by the attending provider.

## 2015-11-16 ENCOUNTER — Inpatient Hospital Stay
Admission: RE | Admit: 2015-11-16 | Discharge: 2015-11-16 | Disposition: A | Discharge status: Home / Self Care | Payer: Medicaid Other | Source: Ambulatory Visit | Attending: Radiation Oncology | Admitting: Radiation Oncology

## 2015-11-16 ENCOUNTER — Ambulatory Visit
Admission: RE | Admit: 2015-11-16 | Discharge: 2015-11-16 | Disposition: A | Discharge status: Home / Self Care | Payer: Medicaid Other | Source: Ambulatory Visit | Attending: Radiation Oncology | Admitting: Radiation Oncology

## 2015-11-16 DIAGNOSIS — C50412 Malignant neoplasm of upper-outer quadrant of left female breast: Secondary | ICD-10-CM

## 2015-11-23 ENCOUNTER — Ambulatory Visit (HOSPITAL_BASED_OUTPATIENT_CLINIC_OR_DEPARTMENT_OTHER): Payer: Medicaid Other | Admitting: Nurse Practitioner

## 2015-11-23 ENCOUNTER — Encounter: Payer: Self-pay | Admitting: Nurse Practitioner

## 2015-11-23 VITALS — BP 142/83 | HR 66 | Temp 98.3°F | Resp 18 | Ht 67.0 in | Wt 276.4 lb

## 2015-11-23 DIAGNOSIS — C50412 Malignant neoplasm of upper-outer quadrant of left female breast: Secondary | ICD-10-CM | POA: Diagnosis not present

## 2015-11-23 NOTE — Progress Notes (Signed)
CLINIC:  Cancer Survivorship   REASON FOR VISIT:  Routine follow-up post-treatment for a recent history of breast cancer.  BRIEF ONCOLOGIC HISTORY:    Breast cancer of upper-outer quadrant of left female breast ([place])   03/13/2015 Mammogram Left breast: possible mass warranting further imaging   04/04/2015 Breast US Left breast: irregular hypoechoic shadowing mass at 2 o'clock location 9 cm from the nipple measuring 7 x 7 x 6 mm.Within the deep left axilla, there is a normal side lymph node with eccentric nodular cortical thickening measuring 4 mm.   04/11/2015 Initial Biopsy Left breast core needle bx: Invasive mammary carcinoma, ER+ (99%), PR+ (90%), HER2/neu negative (ratio 1.09), Ki67 19%. 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   04/23/2015 Procedure Breast/Ovarian gene panel ([facility]) reveals no clinically significant variant at ATM, BARD1, BRCA1, BRCA2, BRIP1, CDH1, CHEK2, FANCC, MLH1, MSH2, MSH6, NBN, PALB2, PMS2, PTEN, RAD51C, RAD51D, STK11, TP53, and XRCC2.    04/30/2015 -  Neo-Adjuvant Anti-estrogen oral therapy Tamoxifen 20 mg daily.  Planned duration of therapy 10 years (with transition to AI once patient post-menopausal)   05/09/2015 Breast MRI Enhancing nodule in the left breast upper outer quadrant, posterior depth, which measures 0.8 x 0.6 by 0.8 cm. 1 cm inferior medial to it, there is a second 4 mm focus with similar enhancement characteristics. No axillary lymphadenopathy.    05/09/2015 Clinical Stage Stage IA: T1b N0   06/22/2015 Definitive Surgery Left lumpectomy/SLNB Ninfa Linden): Invasive lobular carcinoma, grade 1, with ALH and in situ carcinoma, HER2/neu repeated and remains negative (ratio 1.65). 2 LN removed and negative for malignancy.   06/22/2015 Pathologic Stage Stage IA: ypT1b ypN0   06/22/2015 Oncotype testing RS 22 (14% ROR). No chemotherapy per patient.   08/13/2015 - 09/25/2015 Radiation Therapy Adjuvant RT Pablo Ledger): Left breast 45 Gy over 25 fractions. Left breast boost  14 Gy over 8 fractions    INTERVAL HISTORY:  Ms. Nanez presents to the [facility] today for our initial meeting to review her survivorship care plan detailing her treatment course for breast cancer, as well as monitoring long-term side effects of that treatment, education regarding health maintenance, screening, and overall wellness and health promotion.     Overall, Ms. Maya reports feeling well since completing her radiation therapy approximately two months ago.  She denies any fatigue stating that she exercised during treatment.  She reprots that the skin cahnges overlying her left breast have improved greatly although the skin is now lighter in color.  She has noticed some thickening in her left breast following radiation, but denies any mass or nodule. She will occasionally have a shooting, stinging pain along her left breast but this is self limiting.  Ms. Mcbrayer is tolerating the tamoxifen fairly well aside from the hot flashes, which occur daily and are worse at night.  They interfere with her sleep. She denies any vaginal bleeding but does report increased vaginal discharge.  There is no itching, color or foul odor.  She states that she had a menstrual cycle for several months after starting the tamoxifen, missed one month, had a cycle (~09/2015) and has not had any since that time.  She denies any headache, cough, shortness of breath or bone pain.  She has a good appetite and denies any weight loss.  She does have constipation.  REVIEW OF SYSTEMS:  General: Hot flashes as above. Denies fever, chills, or night sweats. HEENT: Denies visual changes, hearing loss, mouth sores or difficulty swallowing. Cardiac: Denies palpitations, chest  pain, and lower extremity edema.  Respiratory: Denies wheeze or dyspnea on exertion.  Breast: Denies any new nodularity, masses, tenderness, nipple changes, or nipple discharge.  GI: Denies abdominal pain, constipation, diarrhea, nausea, or  vomiting.  GU: Denies dysuria, hematuria, vaginal bleeding, vaginal discharge, or vaginal dryness.  Musculoskeletal: Denies joint or bone pain.  Neuro: Denies recent fall or numbness / tingling in her extremities. Skin: Denies rash, pruritis, or open wounds.  Psych: Some insomnia with hot flashes. Occasional anxiety. Otherwise denies depression or memory loss.   A 14-point review of systems was completed and was negative, except as noted above.   ONCOLOGY TREATMENT TEAM:  1. Surgeon:  Dr. Ninfa Linden at [facility]  2. Medical Oncologist: Dr. Jana Hakim 3. Radiation Oncologist: Dr. Pablo Ledger    PAST MEDICAL/SURGICAL HISTORY:  Past Medical History  Diagnosis Date  . Ectopic pregnancy   . Family history of breast cancer   . Family history of kidney cancer   . Colon polyps   . Breast cancer ([place]) dx. 44 04/11/15    ILC of left breast  . Allergy   . Anxiety    Past Surgical History  Procedure Laterality Date  . Lymphadenectomy  1992  . Tubal ligation  2009  . Cervical biopsy  w/ loop electrode excision    . Cholecystectomy    . Cesarean section    . Breast lumpectomy with radioactive seed and sentinel lymph node biopsy Left 06/22/2015    Procedure: LEFT BREAST LUMPECTOMY WITH RADIOACTIVE SEED AND SENTINEL LYMPH NODE BIOPSY;  Surgeon: Coralie Keens, MD;  Location: [facility];  Service: General;  Laterality: Left;     ALLERGIES:  Allergies  Allergen Reactions  . Sulfa Antibiotics Hives     CURRENT MEDICATIONS:  Current Outpatient Prescriptions on File Prior to Visit  Medication Sig Dispense Refill  . ALPRAZolam (XANAX) 0.5 MG tablet Take 0.5 mg by mouth at bedtime as needed for anxiety.    . cetirizine (ZYRTEC) 10 MG tablet Take 10 mg by mouth daily.    . tamoxifen (NOLVADEX) 20 MG tablet Take 1 tablet (20 mg total) by mouth daily. 90 tablet 4   No current facility-administered medications on file prior to visit.     ONCOLOGIC FAMILY HISTORY:   Family History  Problem Relation Age of Onset  . Breast cancer Mother 17  . Breast cancer Sister 71  . Breast cancer Maternal Aunt 60    +calcifications  . Prostate cancer Maternal Uncle 67  . Heart attack Maternal Grandmother   . Heart attack Maternal Grandfather   . Prostate cancer Maternal Grandfather 79  . Diabetes Paternal Grandmother   . Pancreatitis Paternal Grandfather   . Prostate cancer Cousin     dx. late 62s     GENETIC COUNSELING/TESTING: Yes, performed 04/23/2015: Breast/Ovarian gene panel ([facility]) reveals no clinically significant variant at ATM, BARD1, BRCA1, BRCA2, BRIP1, CDH1, CHEK2, FANCC, MLH1, MSH2, MSH6, NBN, PALB2, PMS2, PTEN, RAD51C, RAD51D, STK11, TP53, and XRCC2.    SOCIAL HISTORY:  WINNIFRED DUFFORD is engaged and lives with her family in [place], [place].  She has 2 children: one son and one daughter. 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  She denies any current or history of tobacco or illicit drug use.  She uses alcohol rarely.    PHYSICAL EXAMINATION:  Vital Signs: Filed Vitals:   11/23/15 0921  BP: 142/83  Pulse: 66  Temp: 98.3 F (36.8 C)  Resp: 18   ECOG Performance Status: 0  General: Well-nourished, well-appearing female in no acute distress.  She is unaccompanied in clinic today.   HEENT: Head is atraumatic and normocephalic.  Pupils equal and reactive to light and accomodation. Conjunctivae clear without exudate.  Sclerae anicteric. Oral mucosa is pink, moist, and intact without lesions.  Oropharynx is pink without lesions or erythema.  Lymph: No cervical, supraclavicular, infraclavicular, or axillary lymphadenopathy noted on palpation.  Cardiovascular: Regular rate and rhythm without murmurs, rubs, or gallops. Respiratory: Clear to auscultation bilaterally. Chest expansion symmetric without accessory muscle use on inspiration or expiration.  GI: Abdomen soft and round. No tenderness to palpation.  Bowel sounds normoactive in 4 quadrants. GU: Deferred.  Neuro: No focal deficits. Steady gait.  Psych: Mood and affect normal and appropriate for situation.  Extremities: No edema, cyanosis, or clubbing.  Skin: Warm and dry. No open lesions noted.   LABORATORY DATA:  None for this visit.  DIAGNOSTIC IMAGING:  None for this visit.     ASSESSMENT AND PLAN:   1. History of breast cancer: Stage IA (ypT1b ypN0) invasive lobular carcinoma of the left breast, grade 1, ER positive, PR positive, HER2/neu negative, S/P neoadjuvant endocrine therapy with tamoxifen (begun due to delay in surgery secondary to genetics results / family wedding plans), followed by lumpectomy/SLNB followed by adjuvant radiation to left breast, now on continued endocrine therapy with tamoxifen.  Ms. Calix is doing well without clinical symptoms worrisome for disease recurrence. She will follow-up with her medical oncologist,  Dr. Jana Hakim Nira Conn Boelter NP) in February 2017 with history and physical examination per surveillance protocol.  She will continue her anti-estrogen therapy with tamoxifen as prescribed by Dr. Jana Hakim at this time as she is tolerating it reasonably well aside from the hot flashes.  The hot flashes she is experiencing are likely related to her tamoxifen.  We have discussed various strategies for managing her hot flashes including pharmacologic therapy with gabapentin or venlafaxine.  The gabapentin may be particularly helpful as she states that her hot flashes are worse at night. She will consider this option and call us if interested in beginning therapy. She was instructed to make Dr. Jana Hakim or myself aware if she begins to experience any new or increased side effects of the medication and I could see her back in clinic to help manage those side effects, as needed. Though the incidence is low, there is an associated risk of endometrial cancer with anti-estrogen therapies like Tamoxifen.  Ms. Muldrow  was encouraged to contact Dr. Jana Hakim or myself with any vaginal bleeding while taking Tamoxifen. Other side effects of Tamoxifen were again reviewed with her as well. A comprehensive survivorship care plan and treatment summary was reviewed with the patient today detailing her breast cancer diagnosis, treatment course, potential late/long-term effects of treatment, appropriate follow-up care with recommendations for the future, and patient education resources.  A copy of this summary, along with a letter will be sent to the patient's primary care provider via in basket message after today's visit.  Ms. Godbee is welcome to return to the Survivorship Clinic in the future, as needed; no follow-up will be scheduled at this time.    2. Cancer screening:  Due to Ms. Stovall's history and her age, she should receive screening for skin cancers, colon cancer (beginning at age 41), and gynecologic cancers.  The information and recommendations are listed on the patient's comprehensive care plan/treatment summary and were reviewed in detail with the  patient.    3. Health maintenance and wellness promotion: Ms. Pablo was encouraged to consume 5-7 servings of fruits and vegetables per day. We reviewed the "Nutrition Rainbow" handout, as well as discussed recommendations to maximize nutrition and minimize recurrence, such as increased intake of fruits, vegetables, lean proteins, and minimizing the intake of red meats and processed foods.  She was also encouraged to engage in moderate to vigorous exercise for 30 minutes per day most days of the week. We discussed the [employer] fitness program, which is designed for cancer survivors to help them become more physically fit after cancer treatments.  She was instructed to limit her alcohol consumption and continue to abstain from tobacco use.  A copy of the "Take Control of Your Health" brochure was given to her reinforcing these recommendations.   4. Support  services/counseling: It is not uncommon for this period of the patient's cancer care trajectory to be one of many emotions and stressors.  We discussed an opportunity for her to participate in the next session of [facility] ("Finding Your New Normal") support group series designed for patients after they have completed treatment.  Ms. Severtson was encouraged to take advantage of our many other support services programs, support groups, and/or counseling in coping with her new life as a cancer survivor after completing anti-cancer treatment.  She was offered support today through active listening and expressive supportive counseling.  She was given information regarding our available services and encouraged to contact me with any questions or for help enrolling in any of our support group/programs.    A total of 55 minutes of face-to-face time was spent with this patient with greater than 50% of that time in counseling and care-coordination.   Sylvan Cheese, NP  Survivorship Program [employer] [phone]   Note: PRIMARY CARE PROVIDER Frederico Hamman, [place] [phone]

## 2016-01-08 ENCOUNTER — Other Ambulatory Visit: Payer: Self-pay | Admitting: *Deleted

## 2016-01-08 DIAGNOSIS — C50412 Malignant neoplasm of upper-outer quadrant of left female breast: Secondary | ICD-10-CM

## 2016-01-09 ENCOUNTER — Encounter: Payer: Self-pay | Admitting: Nurse Practitioner

## 2016-01-09 ENCOUNTER — Ambulatory Visit (HOSPITAL_BASED_OUTPATIENT_CLINIC_OR_DEPARTMENT_OTHER): Payer: Medicaid Other | Admitting: Nurse Practitioner

## 2016-01-09 ENCOUNTER — Telehealth: Payer: Self-pay | Admitting: Oncology

## 2016-01-09 ENCOUNTER — Other Ambulatory Visit (HOSPITAL_BASED_OUTPATIENT_CLINIC_OR_DEPARTMENT_OTHER): Payer: Medicaid Other

## 2016-01-09 VITALS — BP 135/85 | HR 72 | Temp 97.4°F | Resp 18 | Ht 67.0 in | Wt 273.3 lb

## 2016-01-09 DIAGNOSIS — C50412 Malignant neoplasm of upper-outer quadrant of left female breast: Secondary | ICD-10-CM | POA: Diagnosis not present

## 2016-01-09 DIAGNOSIS — C50912 Malignant neoplasm of unspecified site of left female breast: Secondary | ICD-10-CM | POA: Diagnosis not present

## 2016-01-09 LAB — COMPREHENSIVE METABOLIC PANEL
ALT: 17 U/L (ref 0–55)
ANION GAP: 8 meq/L (ref 3–11)
AST: 22 U/L (ref 5–34)
Albumin: 3.4 g/dL — ABNORMAL LOW (ref 3.5–5.0)
Alkaline Phosphatase: 77 U/L (ref 40–150)
BUN: 11.4 mg/dL (ref 7.0–26.0)
CALCIUM: 9.3 mg/dL (ref 8.4–10.4)
CHLORIDE: 107 meq/L (ref 98–109)
CO2: 27 mEq/L (ref 22–29)
Creatinine: 1.1 mg/dL (ref 0.6–1.1)
EGFR: 68 mL/min/{1.73_m2} — ABNORMAL LOW (ref >90)
Glucose: 90 mg/dl (ref 70–140)
Potassium: 4.2 mEq/L (ref 3.5–5.1)
Sodium: 142 mEq/L (ref 136–145)
Total Bilirubin: 0.76 mg/dL (ref 0.20–1.20)
Total Protein: 7.6 g/dL (ref 6.4–8.3)

## 2016-01-09 LAB — CBC WITH DIFFERENTIAL/PLATELET
BASO%: 0.2 % (ref 0.0–2.0)
Basophils Absolute: 0 10*3/uL (ref 0.0–0.1)
EOS%: 0.6 % (ref 0.0–7.0)
Eosinophils Absolute: 0 10*3/uL (ref 0.0–0.5)
HEMATOCRIT: 35.8 % (ref 34.8–46.6)
HGB: 12 g/dL (ref 11.6–15.9)
LYMPH%: 35.2 % (ref 14.0–49.7)
MCH: 30.4 pg (ref 25.1–34.0)
MCHC: 33.5 g/dL (ref 31.5–36.0)
MCV: 90.6 fL (ref 79.5–101.0)
MONO#: 0.3 10*3/uL (ref 0.1–0.9)
MONO%: 5.5 % (ref 0.0–14.0)
NEUT#: 3 10*3/uL (ref 1.5–6.5)
NEUT%: 58.5 % (ref 38.4–76.8)
Platelets: 259 10*3/uL (ref 145–400)
RBC: 3.95 10*6/uL (ref 3.70–5.45)
RDW: 14.4 % (ref 11.2–14.5)
WBC: 5.1 10*3/uL (ref 3.9–10.3)
lymph#: 1.8 10*3/uL (ref 0.9–3.3)

## 2016-01-09 NOTE — Telephone Encounter (Signed)
appts made and avs printed

## 2016-01-09 NOTE — Progress Notes (Signed)
[facility]  [phone] [phone]) [phone]     ID: JALEEYA MCNELLY DOB: 1971-08-29  MR#: [id number]  PHX#:[id number]  Patient Care Team: Frederico Hamman, MD as PCP - General (Obstetrics and Gynecology) Chauncey Cruel, MD as Consulting Physician (Oncology) Coralie Keens, MD as Consulting Physician (General Surgery) Thea Silversmith, MD as Consulting Physician (Radiation Oncology) Sylvan Cheese, NP as Nurse Practitioner (Hematology and Oncology) PCP: Frederico Hamman, MD GYN: SU: Coralie Keens, MD OTHER MD: Thea Silversmith M.D.  CHIEF COMPLAINT: Estrogen receptor positive left breast cancer  CURRENT TREATMENT:  Tamoxifen   BREAST CANCER HISTORY: From the original intake note:  The patient had routine bilateral screening mammography at the breast center 03/13/2015 showing a possible mass in the left breast. Left diagnostic mammography with tomosynthesis and left breast ultrasonography 04/04/2015 found the breast density to be category C area did in the left breast upper outer quadrant there was a persistent mass which by ultrasound was irregular and hypoechoic. It measured 0.7 cm. In the left axilla there was a normal size lymph node with eccentric cortical thickening measuring 4 mm in maximal thickness.  On 04/11/2015 the patient underwent biopsy of the left breast mass in question and this showed (SAA [id number]) and invasive lobular carcinoma, E-cadherin negative, grade 1 or 2, estrogen receptor 99% positive, progesterone receptor 90% positive, both with strong staining intensity, with an MIB-1 of 19%. HER-2 was not amplified, the signals ratio being 1.09 and the number per cell 1.90.  The patient's case was presented at the multidisciplinary breast cancer conference 04/18/2015. At that time it was recommended that the patient be referred to genetics and undergo breast MRI.   Her subsequent history is as detailed below  INTERVAL  HISTORY: Danell returns today for follow-up of her estrogen receptor positive breast cancer. She has been on tamoxifen since June 2016 and tolerates this well overall. Her hot flashes are now mainly at night, but she is not interested in anything for them. She has some vaginal wetness that is only a minor annoyance. The interval history is remarkable for the engagement to her fiance. They plan to have a destination wedding in October in [place]. She continues to exercise regularly and is using herablife shakes and supplements to hep with her weight loss.   REVIEW OF SYSTEMS: Since radiation, Bari's left breast has always been firm and swollen. Dr. Pablo Ledger taught her how to perform lymphatic massage techniques. There is some tenderness along the incision line and into the right axilla. A detailed review of systems today was otherwise completely negative.   PAST MEDICAL HISTORY: Past Medical History  Diagnosis Date  . Ectopic pregnancy   . Family history of breast cancer   . Family history of kidney cancer   . Colon polyps   . Breast cancer ([place]) dx. 44 04/11/15    ILC of left breast  . Allergy   . Anxiety     PAST SURGICAL HISTORY: Past Surgical History  Procedure Laterality Date  . Lymphadenectomy  1992  . Tubal ligation  2009  . Cervical biopsy  w/ loop electrode excision    . Cholecystectomy    . Cesarean section    . Breast lumpectomy with radioactive seed and sentinel lymph node biopsy Left 06/22/2015    Procedure: LEFT BREAST LUMPECTOMY WITH RADIOACTIVE SEED AND SENTINEL LYMPH NODE BIOPSY;  Surgeon: Coralie Keens, MD;  Location: [place];  Service: General;  Laterality: Left;    FAMILY HISTORY  Family History  Problem Relation Age of Onset  . Breast cancer Mother 75  . Breast cancer Sister 65  . Breast cancer Maternal Aunt 60    +calcifications  . Prostate cancer Maternal Uncle 67  . Heart attack Maternal Grandmother   . Heart attack Maternal Grandfather     . Prostate cancer Maternal Grandfather 57  . Diabetes Paternal Grandmother   . Pancreatitis Paternal Grandfather   . Prostate cancer Cousin     dx. late 63s  the patient's parents are living, in their late 71s. The patient has one brother, and one sister. The patient's mother was diagnosed with breast cancer at age 66 and  One of the patient's mother's 2 sisters was also diagnosed with breast cancer at age 4. The patient's on sister was diagnosed with breast cancer at age 39. She was tested for the BRCA gene mutations and was negative.  GYNECOLOGIC HISTORY:  No LMP recorded. Menarche age 29 first live birth age 19. The patient is GX P2. Her periods are regular, lasting between 5 and 7 days. They're now slightly better controlled on a NuvaRing IUD.   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 Her job involves a great deal of computer work and some driving, occasionally some walking. At home is just her and her son is a [place], currently 35 years old. The patient's daughter Bricia Taher, 20, also sometimes stays at home. She is studying sports medicine. The patient's significant other, [name], a works for [employer].    ADVANCED DIRECTIVES: not in place; Not in place. At the initial clinic visit 01/09/2016 the patient was given the appropriate forms to complete and notarize at her discretion.  HEALTH MAINTENANCE: Social History  Substance Use Topics  . Smoking status: Never Smoker   . Smokeless tobacco: Not on file  . Alcohol Use: 0.0 oz/week    0 Standard drinks or equivalent per week     Comment: social     Colonoscopy:  PAP: NOV 2015  Bone density:  Lipid panel:  Allergies  Allergen Reactions  . Sulfa Antibiotics Hives    Current Outpatient Prescriptions  Medication Sig Dispense Refill  . cetirizine (ZYRTEC) 10 MG tablet Take 10 mg by mouth daily.    . tamoxifen (NOLVADEX) 20 MG tablet Take 1 tablet (20 mg total) by mouth daily. 90 tablet 4   . ALPRAZolam (XANAX) 0.5 MG tablet Take 0.5 mg by mouth at bedtime as needed for anxiety. Reported on 01/09/2016     No current facility-administered medications for this visit.    OBJECTIVE: Middle-aged African-American woman who appears stated age 45 Vitals:   01/09/16 1221  BP: 135/85  Pulse: 72  Temp: 97.4 F (36.3 C)  Resp: 18     Body mass index is 42.79 kg/(m^2).    ECOG FS:1 - Symptomatic but completely ambulatory  Skin: warm, dry  HEENT: sclerae anicteric, conjunctivae pink, oropharynx clear. No thrush or mucositis.  Lymph Nodes: No cervical or supraclavicular lymphadenopathy  Lungs: clear to auscultation bilaterally, no rales, wheezes, or rhonci  Heart: regular rate and rhythm  Abdomen: round, soft, non tender, positive bowel sounds  Musculoskeletal: No focal spinal tenderness, no peripheral edema  Neuro: non focal, well oriented, positive affect  Breasts; left breast status post lumpectomy and radiation. Tenderness with moderately deep palpation. No evidence of recurrent disease. Left axilla benign. Right breast unremarkable.   LAB RESULTS:  CMP     Component Value Date/Time  NA 142 01/09/2016 1111   NA 138 12/03/2014 1221   K 4.2 01/09/2016 1111   K 3.9 12/03/2014 1221   CL 106 12/03/2014 1221   CO2 27 01/09/2016 1111   CO2 22 12/03/2014 1221   GLUCOSE 90 01/09/2016 1111   GLUCOSE 109* 12/03/2014 1221   BUN 11.4 01/09/2016 1111   BUN 7 12/03/2014 1221   CREATININE 1.1 01/09/2016 1111   CREATININE 1.06 12/03/2014 1221   CALCIUM 9.3 01/09/2016 1111   CALCIUM 9.0 12/03/2014 1221   PROT 7.6 01/09/2016 1111   PROT 7.4 12/03/2014 1221   ALBUMIN 3.4* 01/09/2016 1111   ALBUMIN 4.0 12/03/2014 1221   AST 22 01/09/2016 1111   AST 24 12/03/2014 1221   ALT 17 01/09/2016 1111   ALT 14 12/03/2014 1221   ALKPHOS 77 01/09/2016 1111   ALKPHOS 80 12/03/2014 1221   BILITOT 0.76 01/09/2016 1111   BILITOT 1.1 12/03/2014 1221   GFRNONAA 63* 12/03/2014 1221   GFRAA  73* 12/03/2014 1221    INo results found for: SPEP, UPEP  Lab Results  Component Value Date   WBC 5.1 01/09/2016   NEUTROABS 3.0 01/09/2016   HGB 12.0 01/09/2016   HCT 35.8 01/09/2016   MCV 90.6 01/09/2016   PLT 259 01/09/2016      Chemistry      Component Value Date/Time   NA 142 01/09/2016 1111   NA 138 12/03/2014 1221   K 4.2 01/09/2016 1111   K 3.9 12/03/2014 1221   CL 106 12/03/2014 1221   CO2 27 01/09/2016 1111   CO2 22 12/03/2014 1221   BUN 11.4 01/09/2016 1111   BUN 7 12/03/2014 1221   CREATININE 1.1 01/09/2016 1111   CREATININE 1.06 12/03/2014 1221      Component Value Date/Time   CALCIUM 9.3 01/09/2016 1111   CALCIUM 9.0 12/03/2014 1221   ALKPHOS 77 01/09/2016 1111   ALKPHOS 80 12/03/2014 1221   AST 22 01/09/2016 1111   AST 24 12/03/2014 1221   ALT 17 01/09/2016 1111   ALT 14 12/03/2014 1221   BILITOT 0.76 01/09/2016 1111   BILITOT 1.1 12/03/2014 1221       No results found for: LABCA2  No components found for: [id number]  No results for input(s): INR in the last 168 hours.  Urinalysis    Component Value Date/Time   COLORURINE YELLOW 12/03/2014 1415   APPEARANCEUR CLEAR 12/03/2014 1415   LABSPEC 1.024 12/03/2014 1415   PHURINE 5.5 12/03/2014 1415   GLUCOSEU NEGATIVE 12/03/2014 1415   HGBUR NEGATIVE 12/03/2014 1415   BILIRUBINUR NEGATIVE 12/03/2014 1415   KETONESUR 15* 12/03/2014 1415   PROTEINUR NEGATIVE 12/03/2014 1415   UROBILINOGEN 0.2 12/03/2014 1415   NITRITE NEGATIVE 12/03/2014 1415   LEUKOCYTESUR NEGATIVE 12/03/2014 1415    STUDIES: No results found.  ASSESSMENT: 45 y.[place]. [place] status post left breast biopsy 04/11/2015 for a clinical T1b NX invasive lobular breast cancer, grade 1 or 2, strongly estrogen and progesterone receptor positive, HER-2 not amplified, with an MIB-1 of 19%.  (1) genetics testing 05/04/2015 through the [facility] panel/ [facility] showed no deleterious mutations in ATM,  BARD1, BRCA1, BRCA2, BRIP1, CDH1, CHEK2, FANCC, MLH1, MSH2, MSH6, NBN, PALB2, PMS2, PTEN, RAD51C, RAD51D, STK11, TP53, and XRCC2. Furthermore, deletion/duplication analysis (without next-generation sequencing) was performed for the EPCAM gene.   (2) status post left lumpectomy and sentinel lymph node sampling 06/22/2015 for a pT1b pN0, stage iA invasive lobular breast cancer, grade 1, repeat HER-2  again negative, with ample margins   (3)  Continuing (neo)adjuvant tamoxifen, started June 2016  (4) Oncotype DX score of 22 predicts a risk of recurrence outside the breast within the next 10 years of 14% if the patient's only systemic treatment is tamoxifen for 5 years.   (a)  The patient opted to forego chemotherapy given the very marginal anticipated benefit  (5)  Adjuvant radiation 08/13/15-09/25/15:  Left breast/ 45 Gy at 1.8 Gy per fraction x 25 fractions.  Left breast boost/ 14 Gy at 2 Gy per fraction x 8 fractions  PLAN: Yalena is doing well today. The labs were reviewed in detail and were stable. She is tolerating the tamoxifen well and will continue this drug for at least 2 years before discussing the switch to an aromatase inhibitor. She understands that if she is not post menopausal by this point she will need to continue on the tamoxifen.   She is due for her first mammogram since her lumpectomy this May. I have placed this order to be performed at the [facility].   Katalena will return in 6 months for labs and a follow up visit. She understands and agrees with this plan. She knows the goal of treatment in her case is cure. She has been encouraged to call with any issues that might arise before her next visit here.   Laurie Panda, NP   01/09/2016 12:24 PM

## 2016-01-17 ENCOUNTER — Encounter: Payer: Self-pay | Admitting: Adult Health

## 2016-01-17 NOTE — Progress Notes (Signed)
A birthday card was mailed to the patient today on behalf of the Survivorship Program at [facility].   Mike Craze, NP [place] [phone]

## 2016-01-18 IMAGING — MR MR BREAST BILAT WO/W CM
7 of 11 series · 34 of 48 positions shown · IV contrast (multihance)
Comparison: Mammogram and ultrasound dated 04/04/2015 and
03/13/2015, and previous mammograms.

CLINICAL DATA: New diagnosis of left breast lobular cancer in the
upper outer quadrant.

LABS:  None
EXAM:
BILATERAL BREAST MRI WITH AND WITHOUT CONTRAST
TECHNIQUE: Multiplanar, multisequence MR images of both breasts were obtained
prior to and following the intravenous administration of 20 ml of
MultiHance.

[Series 2: fl3d pre-cm no · axial · non-contrast · 1.2mm · 0.99mm/px · z∈[-73,+99]mm · 5 of 144 slices shown]
[im 1/144]
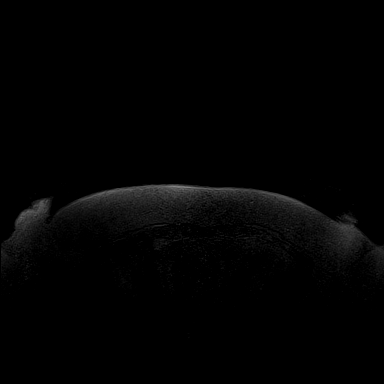
[im 36/144]
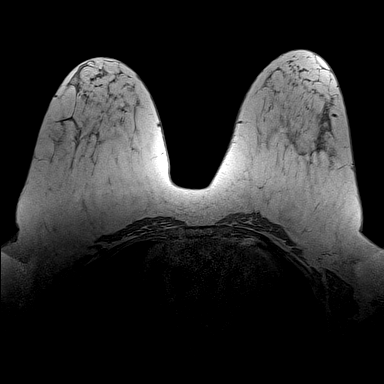
[im 72/144]
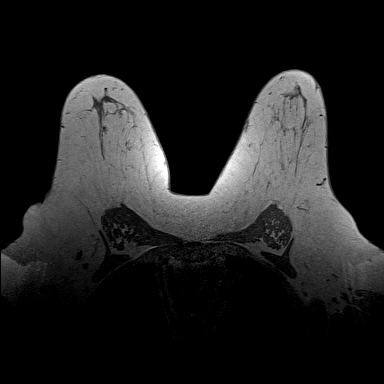
[im 108/144]
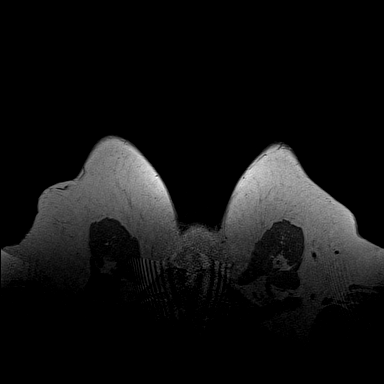
[im 144/144]
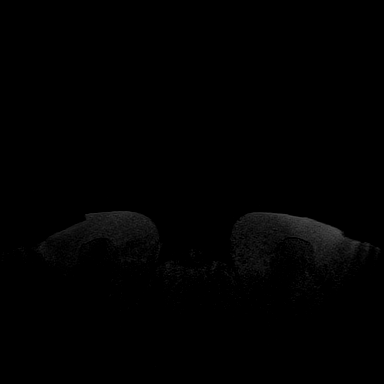

[Series 3: fl3d pre-cm · axial · non-contrast · 1.2mm · 0.99mm/px · z∈[-73,+99]mm · 5 of 144 slices shown]
[im 1/144]
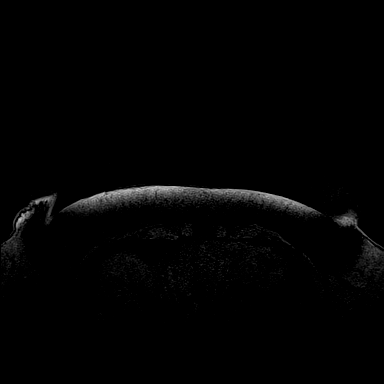
[im 36/144]
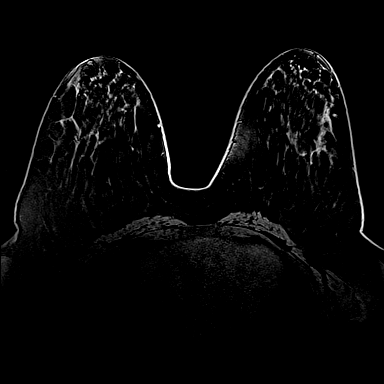
[im 72/144]
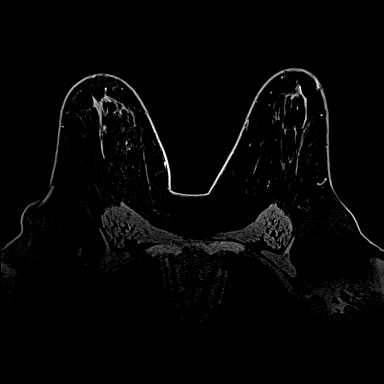
[im 108/144]
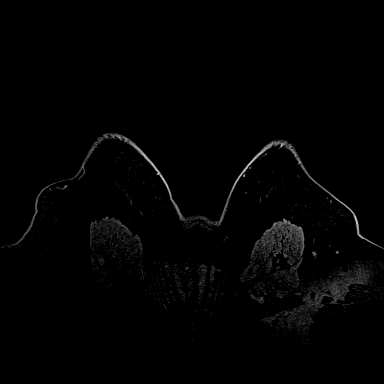
[im 144/144]
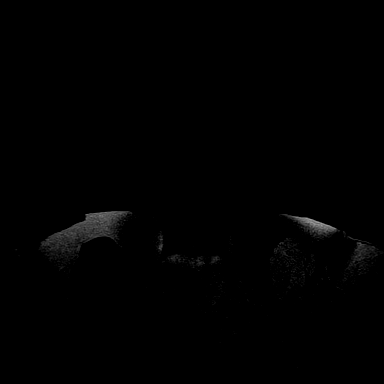

[Series 4: fl3d post-cm 1 · axial · 1.2mm · 0.99mm/px · z∈[-73,+99]mm · 5 of 144 slices shown (1 of 3)]
[im 1/144]
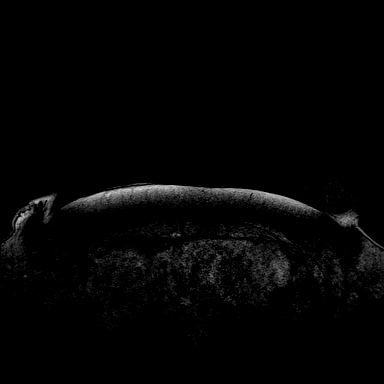
[im 36/144]
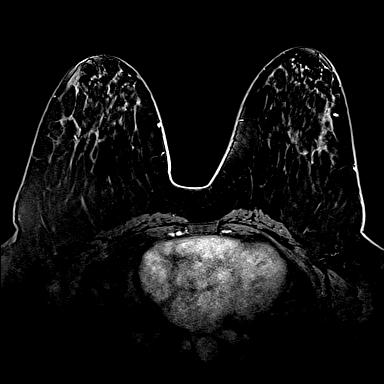
[im 72/144]
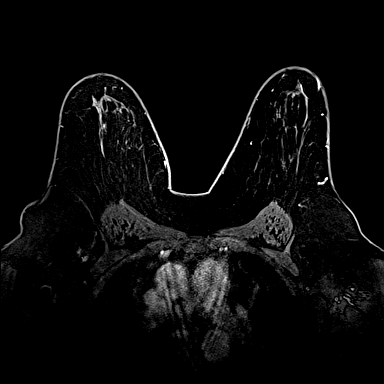
[im 108/144]
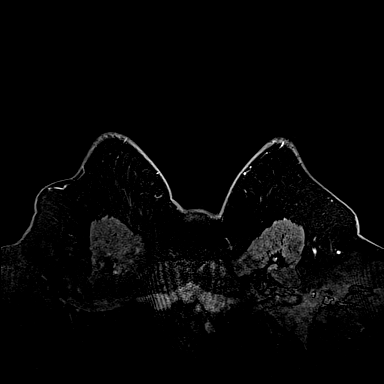
[im 144/144]
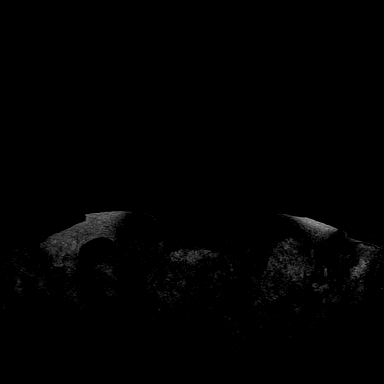

[Series 5: fl3d post-cm 1 · axial · 1.2mm · 0.99mm/px · z∈[-73,+99]mm · 6 of 144 slices shown (2 of 3)]
[im 1/144]
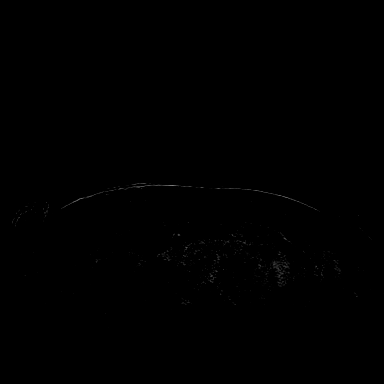
[im 29/144]
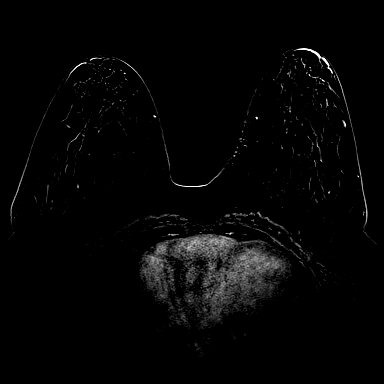
[im 58/144]
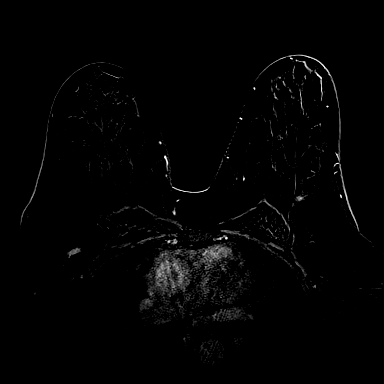
[im 86/144]
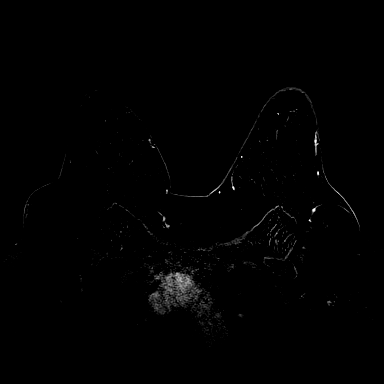
[im 115/144]
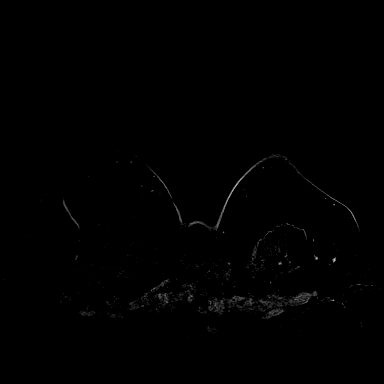
[im 144/144]
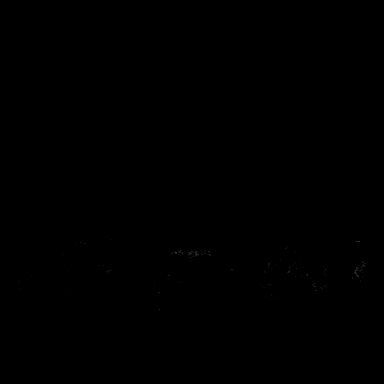

[Series 6: fl3d post-cm 1 · axial · 172.8mm · 0.99mm/px · 1 of 1 slices shown (3 of 3)]
[im 1/1]
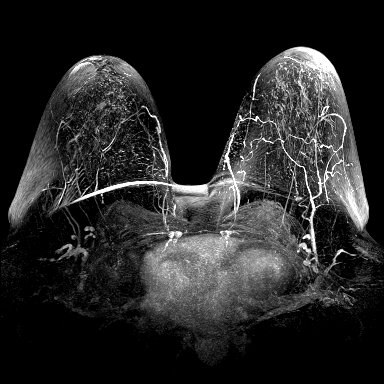

[Series 7: fl3d post-cm 3min · axial · 1.2mm · 0.99mm/px · z∈[-73,+99]mm · 6 of 144 slices shown]
[im 1/144]
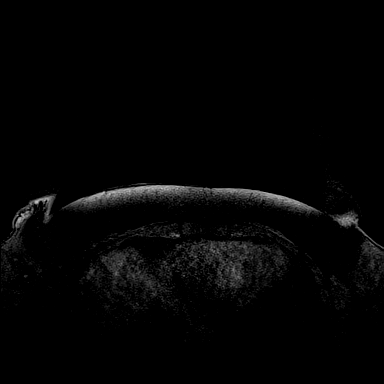
[im 29/144]
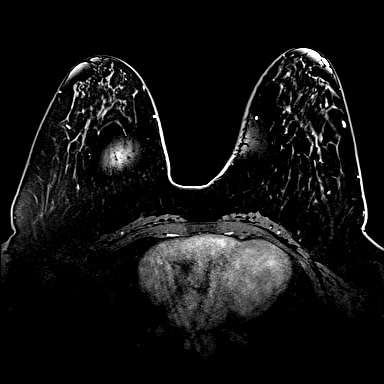
[im 58/144]
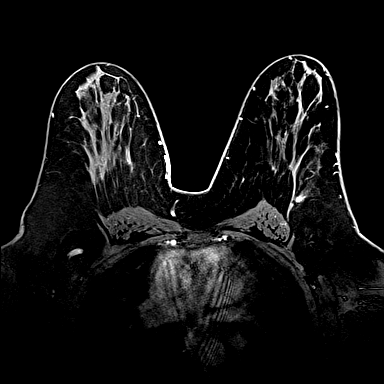
[im 86/144]
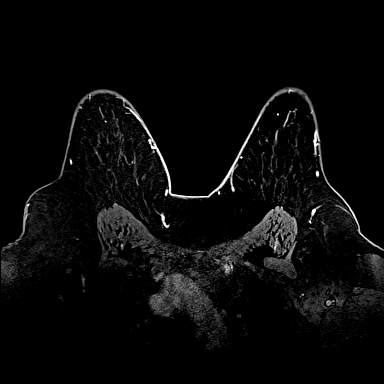
[im 115/144]
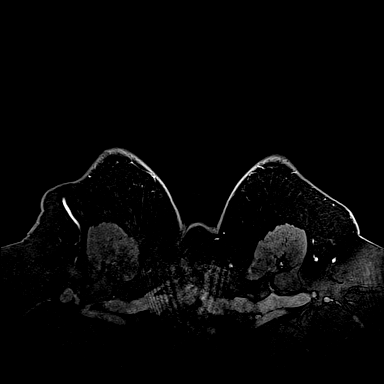
[im 144/144]
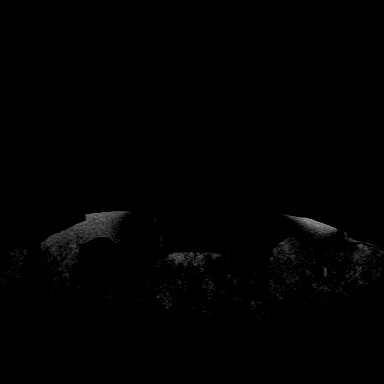

[Series 8: fl3d post-cm 3min_sub · axial · 1.2mm · 0.99mm/px · z∈[-73,+99]mm · 6 of 144 slices shown]
[im 1/144]
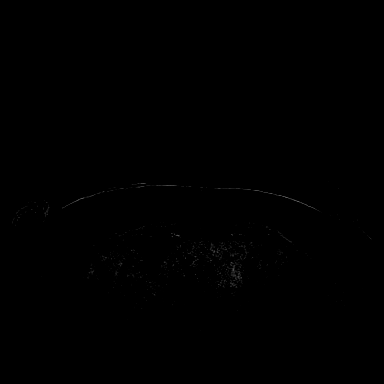
[im 29/144]
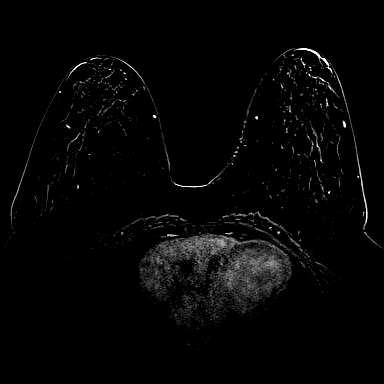
[im 58/144]
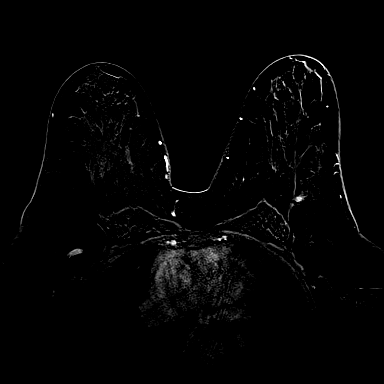
[im 86/144]
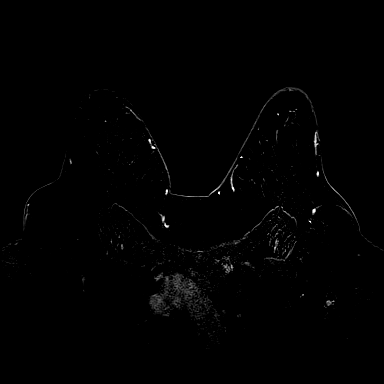
[im 115/144]
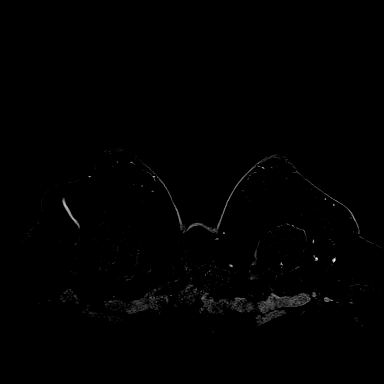
[im 144/144]
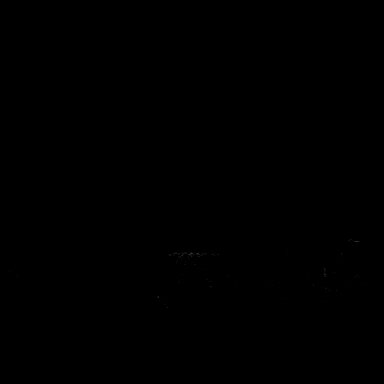

[34 of 48 positions shown; findings below may reference images not displayed]

THREE-DIMENSIONAL MR IMAGE RENDERING ON INDEPENDENT WORKSTATION:

Three-dimensional MR images were rendered by post-processing of the
original MR data on an independent workstation. The
three-dimensional MR images were interpreted, and findings are
reported in the following complete MRI report for this study. Three
dimensional images were evaluated at the independent [employer]
workstation
FINDINGS: Breast composition: b. Scattered fibroglandular tissue.

Background parenchymal enhancement: Moderate.

Right breast: No mass or abnormal enhancement.

Left breast: There is a circumscribed progressively enhancing nodule
in the left breast upper outer quadrant, posterior depth, which
measures 0.8 x 0.6 by 0.8 cm. There are associated post biopsy
changes. This is the site of previously diagnosed breast malignancy.
A 1 cm inferior medial to it, there is a second 4 mm focus with
similar enhancement characteristics.

Lymph nodes: No abnormal appearing lymph nodes.

Ancillary findings:  None.
IMPRESSION: Left breast upper outer quadrant biopsy proven malignancy, with
possible 4 mm satellite nodule 1 cm inferior and medial to it.

No MRI evidence of lymphadenopathy.

RECOMMENDATION:
Medical and surgical treatment of the biopsy proven breast
malignancy.

BI-RADS CATEGORY  6: Known biopsy-proven malignancy.

## 2016-03-12 ENCOUNTER — Telehealth: Payer: Self-pay | Admitting: *Deleted

## 2016-03-12 NOTE — Telephone Encounter (Signed)
This RN spoke with pt per her call stating she has resumed menses post absence of 6 months. She is on tamoxifen and is inquiring " need to know if this is normal ".  This RN informed pt due to age resuming of menses is appropriate. Discussed current flow and expectations- pt is having some cramping which is not unusual per menstrual history.  Pt understands concerns related to need for nonhormonal contraceptives " yes Dr Jana Hakim reviewed that concern with me well ". Rhonda Moss states she is overdue to have a gyn exam and will call to schedule per routine care.  Per conversation pt will schedule an appointment with her gyn- she will call if menses continue beyond her normal length.  No other needs at this time.

## 2016-03-19 LAB — PROCEDURE REPORT - SCANNED: Pap: NEGATIVE

## 2016-04-30 ENCOUNTER — Ambulatory Visit
Admission: RE | Admit: 2016-04-30 | Discharge: 2016-04-30 | Disposition: A | Discharge status: Home / Self Care | Payer: Medicaid Other | Source: Ambulatory Visit | Attending: Nurse Practitioner | Admitting: Nurse Practitioner

## 2016-04-30 DIAGNOSIS — C50412 Malignant neoplasm of upper-outer quadrant of left female breast: Secondary | ICD-10-CM

## 2016-07-01 ENCOUNTER — Other Ambulatory Visit: Payer: Self-pay | Admitting: *Deleted

## 2016-07-01 DIAGNOSIS — C50412 Malignant neoplasm of upper-outer quadrant of left female breast: Secondary | ICD-10-CM

## 2016-07-02 ENCOUNTER — Other Ambulatory Visit (HOSPITAL_BASED_OUTPATIENT_CLINIC_OR_DEPARTMENT_OTHER): Payer: Medicaid Other

## 2016-07-02 ENCOUNTER — Ambulatory Visit (HOSPITAL_BASED_OUTPATIENT_CLINIC_OR_DEPARTMENT_OTHER): Payer: Medicaid Other | Admitting: Oncology

## 2016-07-02 ENCOUNTER — Telehealth: Payer: Self-pay | Admitting: Oncology

## 2016-07-02 VITALS — BP 133/85 | HR 57 | Temp 98.1°F | Resp 18 | Ht 67.0 in | Wt 256.1 lb

## 2016-07-02 DIAGNOSIS — N951 Menopausal and female climacteric states: Secondary | ICD-10-CM | POA: Diagnosis not present

## 2016-07-02 DIAGNOSIS — C50412 Malignant neoplasm of upper-outer quadrant of left female breast: Secondary | ICD-10-CM | POA: Diagnosis not present

## 2016-07-02 DIAGNOSIS — Z170 Estrogen receptor positive status [ER+]: Secondary | ICD-10-CM | POA: Diagnosis not present

## 2016-07-02 LAB — COMPREHENSIVE METABOLIC PANEL
ALBUMIN: 3.4 g/dL — AB (ref 3.5–5.0)
ALT: 14 U/L (ref 0–55)
AST: 19 U/L (ref 5–34)
Alkaline Phosphatase: 69 U/L (ref 40–150)
Anion Gap: 6 mEq/L (ref 3–11)
BILIRUBIN TOTAL: 0.72 mg/dL (ref 0.20–1.20)
BUN: 14 mg/dL (ref 7.0–26.0)
CO2: 26 mEq/L (ref 22–29)
CREATININE: 1.1 mg/dL (ref 0.6–1.1)
Calcium: 9.4 mg/dL (ref 8.4–10.4)
Chloride: 109 mEq/L (ref 98–109)
EGFR: 72 mL/min/{1.73_m2} — ABNORMAL LOW (ref >90)
GLUCOSE: 88 mg/dL (ref 70–140)
Potassium: 4.2 mEq/L (ref 3.5–5.1)
SODIUM: 141 meq/L (ref 136–145)
TOTAL PROTEIN: 7.2 g/dL (ref 6.4–8.3)

## 2016-07-02 LAB — CBC WITH DIFFERENTIAL/PLATELET
BASO%: 0.2 % (ref 0.0–2.0)
BASOS ABS: 0 10*3/uL (ref 0.0–0.1)
EOS ABS: 0 10*3/uL (ref 0.0–0.5)
EOS%: 0.6 % (ref 0.0–7.0)
HCT: 34.7 % — ABNORMAL LOW (ref 34.8–46.6)
HEMOGLOBIN: 11.7 g/dL (ref 11.6–15.9)
LYMPH%: 42.3 % (ref 14.0–49.7)
MCH: 31 pg (ref 25.1–34.0)
MCHC: 33.7 g/dL (ref 31.5–36.0)
MCV: 92 fL (ref 79.5–101.0)
MONO#: 0.3 10*3/uL (ref 0.1–0.9)
MONO%: 6.7 % (ref 0.0–14.0)
NEUT%: 50.2 % (ref 38.4–76.8)
NEUTROS ABS: 2.4 10*3/uL (ref 1.5–6.5)
Platelets: 250 10*3/uL (ref 145–400)
RBC: 3.77 10*6/uL (ref 3.70–5.45)
RDW: 12.2 % (ref 11.2–14.5)
WBC: 4.8 10*3/uL (ref 3.9–10.3)
lymph#: 2 10*3/uL (ref 0.9–3.3)

## 2016-07-02 NOTE — Progress Notes (Signed)
[place]  Telephone:([phone]) [phone] Fax:(336) [phone]     ID: GEORGENE KOPPER DOB: July 23, 1971  MR#: [id number]  DJT#:[id number]  Patient Care Team: Frederico Hamman, MD as PCP - General (Obstetrics and Gynecology) Chauncey Cruel, MD as Consulting Physician (Oncology) Coralie Keens, MD as Consulting Physician (General Surgery) Thea Silversmith, MD as Consulting Physician (Radiation Oncology) Sylvan Cheese, NP as Nurse Practitioner (Hematology and Oncology) PCP: Frederico Hamman, MD GYN: SU: Coralie Keens, MD OTHER MD: Thea Silversmith M.D.  CHIEF COMPLAINT: Estrogen receptor positive left breast cancer  CURRENT TREATMENT:  Tamoxifen   BREAST CANCER HISTORY: From the original intake note:  The patient had routine bilateral screening mammography at the breast center 03/13/2015 showing a possible mass in the left breast. Left diagnostic mammography with tomosynthesis and left breast ultrasonography 04/04/2015 found the breast density to be category C area did in the left breast upper outer quadrant there was a persistent mass which by ultrasound was irregular and hypoechoic. It measured 0.7 cm. In the left axilla there was a normal size lymph node with eccentric cortical thickening measuring 4 mm in maximal thickness.  On 04/11/2015 the patient underwent biopsy of the left breast mass in question and this showed (SAA [id number]) and invasive lobular carcinoma, E-cadherin negative, grade 1 or 2, estrogen receptor 99% positive, progesterone receptor 90% positive, both with strong staining intensity, with an MIB-1 of 19%. HER-2 was not amplified, the signals ratio being 1.09 and the number per cell 1.90.  The patient's case was presented at the multidisciplinary breast cancer conference 04/18/2015. At that time it was recommended that the patient be referred to genetics and undergo breast MRI.   Her subsequent history is as detailed below  INTERVAL  HISTORY: Tenzin returns today for follow-up of her breast cancer. She continues on tamoxifen, which she tolerates well. The problem is hot flashes especially at night. These can wake her up. She reports no other side effects from this medication and she obtains it at a good price.  She is very excited at her upcoming wedding, scheduled for October. Contraception issues were discussed.  REVIEW OF SYSTEMS: She exercises regularly and has managed to lose a considerable amount of weight. She had discussed having her tubes tied with Dr. Jodi Mourning, but he suggested told her even though she had lost weight given her prior history of cesarean sections on ectopic pregnancies he did not feel comfortable with that procedure. A detailed review of systems today was otherwise noncontributory  PAST MEDICAL HISTORY: Past Medical History:  Diagnosis Date  . Allergy   . Anxiety   . Breast cancer ([place]) dx. 45 04/11/15   ILC of left breast  . Colon polyps   . Ectopic pregnancy   . Family history of breast cancer   . Family history of kidney cancer     PAST SURGICAL HISTORY: Past Surgical History:  Procedure Laterality Date  . BREAST LUMPECTOMY WITH RADIOACTIVE SEED AND SENTINEL LYMPH NODE BIOPSY Left 06/22/2015   Procedure: LEFT BREAST LUMPECTOMY WITH RADIOACTIVE SEED AND SENTINEL LYMPH NODE BIOPSY;  Surgeon: Coralie Keens, MD;  Location: [place];  Service: General;  Laterality: Left;  . CERVICAL BIOPSY  W/ LOOP ELECTRODE EXCISION    . CESAREAN SECTION    . CHOLECYSTECTOMY    . LYMPHADENECTOMY  1992  . TUBAL LIGATION  2009    FAMILY HISTORY Family History  Problem Relation Age of Onset  . Breast cancer Mother 92  . Breast  cancer Sister 34  . Breast cancer Maternal Aunt 60    +calcifications  . Prostate cancer Maternal Uncle 67  . Heart attack Maternal Grandmother   . Heart attack Maternal Grandfather   . Prostate cancer Maternal Grandfather 42  . Diabetes Paternal Grandmother     . Pancreatitis Paternal Grandfather   . Prostate cancer Cousin     dx. late 34s  the patient's parents are living, in their late 29s. The patient has one brother, and one sister. The patient's mother was diagnosed with breast cancer at age 58 and  One of the patient's mother's 2 sisters was also diagnosed with breast cancer at age 59. The patient's on sister was diagnosed with breast cancer at age 88. She was tested for the BRCA gene mutations and was negative.  GYNECOLOGIC HISTORY:  No LMP recorded. Menarche age 21 first live birth age 45. The patient is GX P2. Her periods are regular, lasting between 5 and 7 days. They're now slightly better controlled on a NuvaRing IUD.   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 At home is just her and her son is a Kiger, currently 24 years old. The patient's daughter Adajah Cocking, 20, also sometimes stays at home. She is studying sports medicine. The patient's significant other, [name], a works for [employer]. They are planning to get married in October 2017    ADVANCED DIRECTIVES: Not in place. At the initial clinic visit 07/02/2016 the patient was given the appropriate forms to complete and notarize at her discretion.  HEALTH MAINTENANCE: Social History  Substance Use Topics  . Smoking status: Never Smoker  . Smokeless tobacco: Not on file  . Alcohol use 0.0 oz/week     Comment: social     Colonoscopy:  PAP: NOV 2015  Bone density:  Lipid panel:  Allergies  Allergen Reactions  . Sulfa Antibiotics Hives    Current Outpatient Prescriptions  Medication Sig Dispense Refill  . ALPRAZolam (XANAX) 0.5 MG tablet Take 0.5 mg by mouth at bedtime as needed for anxiety. Reported on 01/09/2016    . cetirizine (ZYRTEC) 10 MG tablet Take 10 mg by mouth daily.    . tamoxifen (NOLVADEX) 20 MG tablet Take 1 tablet (20 mg total) by  mouth daily. 90 tablet 4   No current facility-administered medications for this visit.     OBJECTIVE: Middle-aged African-American woman  In no acute distress Vitals:   07/02/16 1153  BP: 133/85  Pulse: (!) 57  Resp: 18  Temp: 98.1 F (36.7 C)     Body mass index is 40.11 kg/m.    ECOG FS:0 - Asymptomatic  Sclerae unicteric, pupils round and equal Oropharynx clear and moist-- no thrush or other lesions No cervical or supraclavicular adenopathy Lungs no rales or rhonchi Heart regular rate and rhythm Abd soft, nontender, positive bowel sounds MSK no focal spinal tenderness, no upper extremity lymphedema Neuro: nonfocal, well oriented, appropriate affect Breasts: The right breast is unremarkable. The left breast is status post lumpectomy and radiation. There is hyperpigmentation and some skin thickening, but no evidence of disease recurrence. The left axilla is benign.    LAB RESULTS:  CMP     Component Value Date/Time   NA 141 07/02/2016 1132   K 4.2 07/02/2016 1132   CL 106 12/03/2014 1221   CO2 26 07/02/2016 1132   GLUCOSE 88 07/02/2016 1132  BUN 14.0 07/02/2016 1132   CREATININE 1.1 07/02/2016 1132   CALCIUM 9.4 07/02/2016 1132   PROT 7.2 07/02/2016 1132   ALBUMIN 3.4 (L) 07/02/2016 1132   AST 19 07/02/2016 1132   ALT 14 07/02/2016 1132   ALKPHOS 69 07/02/2016 1132   BILITOT 0.72 07/02/2016 1132   GFRNONAA 63 (L) 12/03/2014 1221   GFRAA 73 (L) 12/03/2014 1221    INo results found for: SPEP, UPEP  Lab Results  Component Value Date   WBC 4.8 07/02/2016   NEUTROABS 2.4 07/02/2016   HGB 11.7 07/02/2016   HCT 34.7 (L) 07/02/2016   MCV 92.0 07/02/2016   PLT 250 07/02/2016      Chemistry      Component Value Date/Time   NA 141 07/02/2016 1132   K 4.2 07/02/2016 1132   CL 106 12/03/2014 1221   CO2 26 07/02/2016 1132   BUN 14.0 07/02/2016 1132   CREATININE 1.1 07/02/2016 1132      Component Value Date/Time   CALCIUM 9.4 07/02/2016 1132   ALKPHOS 69  07/02/2016 1132   AST 19 07/02/2016 1132   ALT 14 07/02/2016 1132   BILITOT 0.72 07/02/2016 1132       No results found for: LABCA2  No components found for: LABCA125  No results for input(s): INR in the last 168 hours.  Urinalysis    Component Value Date/Time   COLORURINE YELLOW 12/03/2014 [place] 12/03/2014 1415   LABSPEC 1.024 12/03/2014 1415   PHURINE 5.5 12/03/2014 1415   GLUCOSEU NEGATIVE 12/03/2014 1415   HGBUR NEGATIVE 12/03/2014 1415   BILIRUBINUR NEGATIVE 12/03/2014 1415   KETONESUR 15 (A) 12/03/2014 1415   PROTEINUR NEGATIVE 12/03/2014 1415   UROBILINOGEN 0.2 12/03/2014 1415   NITRITE NEGATIVE 12/03/2014 1415   LEUKOCYTESUR NEGATIVE 12/03/2014 1415    STUDIES: Study Result   CLINICAL DATA:  Status post left lumpectomy and radiation therapy for breast cancer in 2016.  EXAM: 2D DIGITAL DIAGNOSTIC BILATERAL MAMMOGRAM WITH CAD AND ADJUNCT TOMO  COMPARISON:  Previous exam(s).  ACR Breast Density Category b: There are scattered areas of fibroglandular density.  FINDINGS: Interval post lumpectomy and postradiation changes on the left. No findings suspicious for malignancy in either breast.  Mammographic images were processed with CAD.  IMPRESSION: No evidence of malignancy.  RECOMMENDATION: Bilateral diagnostic mammogram in 1 year.  I have discussed the findings and recommendations with the patient. Results were also provided in writing at the conclusion of the visit. If applicable, a reminder letter will be sent to the patient regarding the next appointment.  BI-RADS CATEGORY  2: Benign.   Electronically Signed   By: Claudie Revering M.D.   On: 04/30/2016 11:03      ASSESSMENT: 45 [place] status post left breast upper outer quadrant biopsy 04/11/2015 for a clinical T1b NX invasive lobular breast cancer, grade 1 or 2, strongly estrogen and progesterone receptor positive, HER-2 not amplified, with  an MIB-1 of 19%.  (1) genetics testing 05/04/2015 through the [place] panel/ [facility] showed no deleterious mutations in ATM, BARD1, BRCA1, BRCA2, BRIP1, CDH1, CHEK2, FANCC, MLH1, MSH2, MSH6, NBN, PALB2, PMS2, PTEN, RAD51C, RAD51D, STK11, TP53, and XRCC2. Furthermore, deletion/duplication analysis (without next-generation sequencing) was performed for the EPCAM gene.   (2) status post left lumpectomy and sentinel lymph node sampling 06/22/2015 for a pT1b pN0, stage iA invasive lobular breast cancer, grade 1, repeat HER-2 again negative, with ample margins   (3)  Continuing  adjuvant tamoxifen, started neoadjuvantly June 2016  (4) Oncotype DX score of 22 predicts a risk of recurrence outside the breast within the next 10 years of 14% if the patient's only systemic treatment is tamoxifen for 5 years.   (a)  The patient opted to forego chemotherapy given the very marginal anticipated benefit  (5)  Adjuvant radiation 08/13/15-09/25/15:  Left breast/ 45 Gy at 1.8 Gy per fraction x 25 fractions.  Left breast boost/ 14 Gy at 2 Gy per fraction x 8 fractions  PLAN: Latrish is now one year out from definitive surgery for her cancer with no evidence of disease recurrence. This is very favorable.  She is generally tolerating tamoxifen well. The issue is nighttime hot flashes. We discussed gabapentin, but she declines any interventions at this time.  Of course she understands that tamoxifen is not a contraceptive. She is having intermittent periods. She has a history of cesarean sections and ectopic pregnancies, which probably would make it difficult for her to have her tubes tied. She refuses IUDs. The current method of contraception they're using is not particularly effective, although so far she has not gotten pregnant  I think she would benefit from discussing this with a gynecologist. We are trying to locate one who is currently accepting Medicaid patients and will refer  her.  Otherwise I am delighted at her upcoming marriage. She will see Dr. Ninfa Linden again next February or March and see me again in August of next year she knows to call for any problems that may develop before that visit.  Chauncey Cruel, MD   07/02/2016 7:10 PM

## 2016-07-02 NOTE — Telephone Encounter (Signed)
appt made and avs printed °

## 2016-07-03 ENCOUNTER — Telehealth: Payer: Self-pay | Admitting: *Deleted

## 2016-07-26 ENCOUNTER — Encounter: Payer: Self-pay | Admitting: *Deleted

## 2016-08-20 ENCOUNTER — Telehealth: Payer: Self-pay | Admitting: *Deleted

## 2016-08-20 ENCOUNTER — Other Ambulatory Visit: Payer: Self-pay | Admitting: Oncology

## 2016-08-20 NOTE — Telephone Encounter (Signed)
Pt. called in regards to a RX refill. She stated that she needs her Tamoxifen refilled. Called was transferred to vmail

## 2016-10-31 NOTE — Telephone Encounter (Signed)
none

## 2017-03-20 ENCOUNTER — Other Ambulatory Visit: Payer: Self-pay | Admitting: Oncology

## 2017-03-20 ENCOUNTER — Other Ambulatory Visit: Payer: Self-pay

## 2017-03-20 DIAGNOSIS — Z853 Personal history of malignant neoplasm of breast: Secondary | ICD-10-CM

## 2017-03-20 DIAGNOSIS — Z9889 Other specified postprocedural states: Secondary | ICD-10-CM

## 2017-05-01 ENCOUNTER — Ambulatory Visit
Admission: RE | Admit: 2017-05-01 | Discharge: 2017-05-01 | Disposition: A | Discharge status: Home / Self Care | Payer: Medicaid Other | Source: Ambulatory Visit | Attending: Oncology | Admitting: Oncology

## 2017-05-01 DIAGNOSIS — Z853 Personal history of malignant neoplasm of breast: Secondary | ICD-10-CM

## 2017-05-01 DIAGNOSIS — Z9889 Other specified postprocedural states: Secondary | ICD-10-CM

## 2017-06-23 ENCOUNTER — Other Ambulatory Visit: Payer: Self-pay | Admitting: Emergency Medicine

## 2017-06-23 DIAGNOSIS — C50412 Malignant neoplasm of upper-outer quadrant of left female breast: Secondary | ICD-10-CM

## 2017-06-24 ENCOUNTER — Other Ambulatory Visit (HOSPITAL_BASED_OUTPATIENT_CLINIC_OR_DEPARTMENT_OTHER): Payer: BLUE CROSS/BLUE SHIELD

## 2017-06-24 DIAGNOSIS — C50412 Malignant neoplasm of upper-outer quadrant of left female breast: Secondary | ICD-10-CM | POA: Diagnosis not present

## 2017-06-24 LAB — COMPREHENSIVE METABOLIC PANEL
ALBUMIN: 3.5 g/dL (ref 3.5–5.0)
ALK PHOS: 69 U/L (ref 40–150)
ALT: 19 U/L (ref 0–55)
AST: 25 U/L (ref 5–34)
Anion Gap: 8 mEq/L (ref 3–11)
BILIRUBIN TOTAL: 0.7 mg/dL (ref 0.20–1.20)
BUN: 13.2 mg/dL (ref 7.0–26.0)
CALCIUM: 9.6 mg/dL (ref 8.4–10.4)
CO2: 27 mEq/L (ref 22–29)
CREATININE: 1.1 mg/dL (ref 0.6–1.1)
Chloride: 105 mEq/L (ref 98–109)
EGFR: 67 mL/min/{1.73_m2} — ABNORMAL LOW (ref >90)
Glucose: 90 mg/dl (ref 70–140)
Potassium: 4.5 mEq/L (ref 3.5–5.1)
Sodium: 140 mEq/L (ref 136–145)
Total Protein: 7.5 g/dL (ref 6.4–8.3)

## 2017-06-24 LAB — CBC WITH DIFFERENTIAL/PLATELET
BASO%: 0.2 % (ref 0.0–2.0)
BASOS ABS: 0 10*3/uL (ref 0.0–0.1)
EOS%: 0.6 % (ref 0.0–7.0)
Eosinophils Absolute: 0 10*3/uL (ref 0.0–0.5)
HEMATOCRIT: 32.1 % — AB (ref 34.8–46.6)
HEMOGLOBIN: 10.3 g/dL — AB (ref 11.6–15.9)
LYMPH%: 34 % (ref 14.0–49.7)
MCH: 27.5 pg (ref 25.1–34.0)
MCHC: 32.1 g/dL (ref 31.5–36.0)
MCV: 85.8 fL (ref 79.5–101.0)
MONO#: 0.4 10*3/uL (ref 0.1–0.9)
MONO%: 7.6 % (ref 0.0–14.0)
NEUT#: 2.8 10*3/uL (ref 1.5–6.5)
NEUT%: 57.6 % (ref 38.4–76.8)
Platelets: 281 10*3/uL (ref 145–400)
RBC: 3.74 10*6/uL (ref 3.70–5.45)
RDW: 15.7 % — AB (ref 11.2–14.5)
WBC: 4.9 10*3/uL (ref 3.9–10.3)
lymph#: 1.7 10*3/uL (ref 0.9–3.3)

## 2017-07-01 ENCOUNTER — Inpatient Hospital Stay: Payer: BLUE CROSS/BLUE SHIELD | Attending: Oncology | Admitting: Oncology

## 2017-07-01 VITALS — BP 132/87 | HR 72 | Temp 98.6°F | Resp 20 | Ht 67.0 in | Wt 270.2 lb

## 2017-07-01 DIAGNOSIS — N951 Menopausal and female climacteric states: Secondary | ICD-10-CM

## 2017-07-01 DIAGNOSIS — Z79811 Long term (current) use of aromatase inhibitors: Secondary | ICD-10-CM

## 2017-07-01 DIAGNOSIS — C50412 Malignant neoplasm of upper-outer quadrant of left female breast: Secondary | ICD-10-CM

## 2017-07-01 DIAGNOSIS — Z170 Estrogen receptor positive status [ER+]: Secondary | ICD-10-CM | POA: Diagnosis not present

## 2017-07-01 MED ORDER — GABAPENTIN 300 MG PO CAPS: 300.0000 mg | ORAL_CAPSULE | Freq: Every day | ORAL | 4 refills | Status: DC

## 2017-07-01 NOTE — Progress Notes (Signed)
Rhonda Moss  [phone] [phone]     ID: Rhonda Moss DOB: 11-Apr-1971  MR#: [id number]  YKD#:[id number]  Patient Care Team: Frederico Hamman, MD (Inactive) as PCP - General (Obstetrics and Gynecology) Lashaya Kienitz, Virgie Dad, MD as Consulting Physician (Oncology) Coralie Keens, MD as Consulting Physician (General Surgery) Thea Silversmith, MD (Inactive) as Consulting Physician (Radiation Oncology) Sylvan Cheese, NP as Nurse Practitioner (Hematology and Oncology) PCP: Frederico Hamman, MD (Inactive) GYN: SU: Coralie Keens, MD OTHER MD: Thea Silversmith M.D.  CHIEF COMPLAINT: Estrogen receptor positive left breast cancer  CURRENT TREATMENT:  Tamoxifen   BREAST CANCER HISTORY: From the original intake note:  The patient had routine bilateral screening mammography at the breast center 03/13/2015 showing a possible mass in the left breast. Left diagnostic mammography with tomosynthesis and left breast ultrasonography 04/04/2015 found the breast density to be category C area did in the left breast upper outer quadrant there was a persistent mass which by ultrasound was irregular and hypoechoic. It measured 0.7 cm. In the left axilla there was a normal size lymph node with eccentric cortical thickening measuring 4 mm in maximal thickness.  On 04/11/2015 the patient underwent biopsy of the left breast mass in question and this showed (SAA [id number]) and invasive lobular carcinoma, E-cadherin negative, grade 1 or 2, estrogen receptor 99% positive, progesterone receptor 90% positive, both with strong staining intensity, with an MIB-1 of 19%. HER-2 was not amplified, the signals ratio being 1.09 and the number per cell 1.90.  The patient's case was presented at the multidisciplinary breast cancer conference 04/18/2015. At that time it was recommended that the patient be referred to genetics and undergo breast MRI.   Her subsequent history  is as detailed below  INTERVAL HISTORY: Rhonda Moss returns today for follow-up and treatment of her estrogen receptor positive breast cancer. She continues on tamoxifen, with good tolerance. Hot flashes are the main problem. They occur during the day and night. They do keep her up some. She does not have problems with vaginal wetness bed she obtains a drug at a good price.  REVIEW OF SYSTEMS: She lost a lot of weight and her hot flashes got worse. She gained a little bit back in the hot flashes Little bit better. She is exercising a little less frequently now because her son, is a baseball player at 36 years old, does a lot of travel and she of course has to drive him. Aside from that a detailed review of systems today was unremarkable  PAST MEDICAL HISTORY: Past Medical History:  Diagnosis Date  . Allergy   . Anxiety   . Breast cancer ([facility]) dx. 44 04/11/15   ILC of left breast  . Colon polyps   . Ectopic pregnancy   . Family history of breast cancer   . Family history of kidney cancer     PAST SURGICAL HISTORY: Past Surgical History:  Procedure Laterality Date  . BREAST LUMPECTOMY     left august 2016  . BREAST LUMPECTOMY WITH RADIOACTIVE SEED AND SENTINEL LYMPH NODE BIOPSY Left 06/22/2015   Procedure: LEFT BREAST LUMPECTOMY WITH RADIOACTIVE SEED AND SENTINEL LYMPH NODE BIOPSY;  Surgeon: Coralie Keens, MD;  Location: [facility];  Service: General;  Laterality: Left;  . CERVICAL BIOPSY  W/ LOOP ELECTRODE EXCISION    . CESAREAN SECTION    . CHOLECYSTECTOMY    . LYMPHADENECTOMY  1992  . TUBAL LIGATION  2009    FAMILY HISTORY Family  History  Problem Relation Age of Onset  . Breast cancer Mother 45  . Breast cancer Sister 36  . Breast cancer Maternal Aunt 60       +calcifications  . Prostate cancer Maternal Uncle 67  . Heart attack Maternal Grandmother   . Heart attack Maternal Grandfather   . Prostate cancer Maternal Grandfather 5  . Diabetes Paternal Grandmother    . Pancreatitis Paternal Grandfather   . Prostate cancer Cousin        dx. late 8s  the patient's parents are living, in their late 63s. The patient has one brother, and one sister. The patient's mother was diagnosed with breast cancer at age 98 and  One of the patient's mother's 2 sisters was also diagnosed with breast cancer at age 7. The patient's on sister was diagnosed with breast cancer at age 80. She was tested for the BRCA gene mutations and was negative.  GYNECOLOGIC HISTORY:  No LMP recorded. Menarche age 89 first live birth age 59. The patient is GX P2. Her periods are regular, lasting between 5 and 7 days. They're now slightly better controlled on a NuvaRing IUD.   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 Her job involves a great deal of computer work and some driving, occasionally some walking. In October 2017 she married Rhonda Moss, who works for [employer]. At home her son Rhonda Moss, currently 31 years old, is very active and baseball and several coaches from [place] are according him. The patient's daughter Rhonda Moss, will graduate from recreational therapy this December and proceed to a graduate degree. sometimes stays at home. She is studying sports medicine.    ADVANCED DIRECTIVES: Not in place. At the initial clinic visit 07/01/2017 the patient was given the appropriate forms to complete and notarize at her discretion.  HEALTH MAINTENANCE: Social History  Substance Use Topics  . Smoking status: Never Smoker  . Smokeless tobacco: Not on file  . Alcohol use 0.0 oz/week     Comment: social     Colonoscopy:  PAP: NOV 2015  Bone density:  Lipid panel:  Allergies  Allergen Reactions  . Sulfa Antibiotics Hives    Current Outpatient Prescriptions  Medication Sig Dispense Refill  . ALPRAZolam (XANAX) 0.5 MG tablet Take 0.5 mg by mouth at bedtime as needed for anxiety. Reported on 01/09/2016    . cetirizine  (ZYRTEC) 10 MG tablet Take 10 mg by mouth daily.    Marland Kitchen gabapentin (NEURONTIN) 300 MG capsule Take 1 capsule (300 mg total) by mouth at bedtime. 90 capsule 4  . tamoxifen (NOLVADEX) 20 MG tablet TAKE 1 TABLET BY MOUTH DAILY 90 tablet 3   No current facility-administered medications for this visit.     OBJECTIVE: Middle-aged African-American womanWho appears well  Vitals:   07/01/17 1318  BP: 132/87  Pulse: 72  Resp: 20  Temp: 98.6 F (37 C)  SpO2: 100%     Body mass index is 42.32 kg/m.    ECOG FS:0 - Asymptomatic  Sclerae unicteric, EOMs intact Oropharynx clear and moist No cervical or supraclavicular adenopathy Lungs no rales or rhonchi Heart regular rate and rhythm Abd soft, nontender, positive bowel sounds MSK no focal spinal tenderness, no upper extremity lymphedema Neuro: nonfocal, well oriented, appropriate affect Breasts: The right breast is benign for the left breast is status post lumpectomy and radiation. There is no evidence of local recurrence. Both axillae are benign.  LAB RESULTS:  CMP  Component Value Date/Time   NA 140 06/24/2017 1130   K 4.5 06/24/2017 1130   CL 106 12/03/2014 1221   CO2 27 06/24/2017 1130   GLUCOSE 90 06/24/2017 1130   BUN 13.2 06/24/2017 1130   CREATININE 1.1 06/24/2017 1130   CALCIUM 9.6 06/24/2017 1130   PROT 7.5 06/24/2017 1130   ALBUMIN 3.5 06/24/2017 1130   AST 25 06/24/2017 1130   ALT 19 06/24/2017 1130   ALKPHOS 69 06/24/2017 1130   BILITOT 0.70 06/24/2017 1130   GFRNONAA 63 (L) 12/03/2014 1221   GFRAA 73 (L) 12/03/2014 1221    INo results found for: SPEP, UPEP  Lab Results  Component Value Date   WBC 4.9 06/24/2017   NEUTROABS 2.8 06/24/2017   HGB 10.3 (L) 06/24/2017   HCT 32.1 (L) 06/24/2017   MCV 85.8 06/24/2017   PLT 281 06/24/2017      Chemistry      Component Value Date/Time   NA 140 06/24/2017 1130   K 4.5 06/24/2017 1130   CL 106 12/03/2014 1221   CO2 27 06/24/2017 1130   BUN 13.2 06/24/2017  1130   CREATININE 1.1 06/24/2017 1130      Component Value Date/Time   CALCIUM 9.6 06/24/2017 1130   ALKPHOS 69 06/24/2017 1130   AST 25 06/24/2017 1130   ALT 19 06/24/2017 1130   BILITOT 0.70 06/24/2017 1130       No results found for: LABCA2  No components found for: LABCA125  No results for input(s): INR in the last 168 hours.  Urinalysis    Component Value Date/Time   COLORURINE YELLOW 12/03/2014 1415   APPEARANCEUR CLEAR 12/03/2014 1415   LABSPEC 1.024 12/03/2014 1415   PHURINE 5.5 12/03/2014 1415   GLUCOSEU NEGATIVE 12/03/2014 1415   HGBUR NEGATIVE 12/03/2014 1415   BILIRUBINUR NEGATIVE 12/03/2014 1415   KETONESUR 15 (A) 12/03/2014 1415   PROTEINUR NEGATIVE 12/03/2014 1415   UROBILINOGEN 0.2 12/03/2014 1415   NITRITE NEGATIVE 12/03/2014 1415   LEUKOCYTESUR NEGATIVE 12/03/2014 1415    STUDIES: Bilateral diagnostic mammography with tomography at [facility] 05/01/2017 showed the breast density to be category B. There was no evidence of malignancy.   ASSESSMENT: 46 y.o. [place] status post left breast upper outer quadrant biopsy 04/11/2015 for a clinical T1b NX invasive lobular breast cancer, grade 1 or 2, strongly estrogen and progesterone receptor positive, HER-2 not amplified, with an MIB-1 of 19%.  (1) genetics testing 05/04/2015 through the [place] panel/ [facility] showed no deleterious mutations in ATM, BARD1, BRCA1, BRCA2, BRIP1, CDH1, CHEK2, FANCC, MLH1, MSH2, MSH6, NBN, PALB2, PMS2, PTEN, RAD51C, RAD51D, STK11, TP53, and XRCC2. Furthermore, deletion/duplication analysis (without next-generation sequencing) was performed for the EPCAM gene.   (2) status post left lumpectomy and sentinel lymph node sampling 06/22/2015 for a pT1b pN0, stage iA invasive lobular breast cancer, grade 1, repeat HER-2 again negative, with ample margins   (3)  Continuing adjuvant tamoxifen, started neoadjuvantly June 2016  (4) Oncotype DX  score of 22 predicts a risk of recurrence outside the breast within the next 10 years of 14% if the patient's only systemic treatment is tamoxifen for 5 years.   (a)  The patient opted to forego chemotherapy given the very marginal anticipated benefit  (5)  Adjuvant radiation 08/13/15-09/25/15:  Left breast/ 45 Gy at 1.8 Gy per fraction x 25 fractions.  Left breast boost/ 14 Gy at 2 Gy per fraction x 8 fractions  PLAN: Stephine is now  2 years out from definitive surgery for her breast cancer with no evidence of disease recurrence. This is very favorable.  She continues on tamoxifen and the plan will be for 5 years on that medication. She is having significant hot flashes, but is reluctant to take any more medicine as she puts it. Today we did discuss venlafaxine, TTS 1 and gabapentin as options. She is willing to give gabapentin a try. She has a good understanding of the possible toxicities, side effects and implications. She will let me know if there are any problems with it. I went ahead and placed prescription.  The method of contraception they're using is not effective and she understands she is at risk of becoming pregnant. She now has better insurance and tells me she is going to be discussing this further with her gynecologist--this has changed since Dr. Ruthann Cancer has retired  Otherwise she will return to see me in one year. She knows to call for any problems that may develop before the next visit here.  Chauncey Cruel, MD   07/01/2017 1:46 PM

## 2017-08-07 ENCOUNTER — Telehealth: Payer: Self-pay

## 2017-08-07 MED ORDER — TAMOXIFEN CITRATE 20 MG PO TABS: 20.0000 mg | ORAL_TABLET | Freq: Every day | ORAL | 3 refills | Status: DC

## 2017-08-07 NOTE — Telephone Encounter (Signed)
Pt called for tamoxifen refill. Done.  Pt asked about getting her records for herself. Explained HIM next to the gift shop for her.

## 2017-08-12 ENCOUNTER — Telehealth: Payer: Self-pay | Admitting: *Deleted

## 2017-08-12 NOTE — Telephone Encounter (Signed)
On 08-12-17 mail medical records to the patient

## 2017-08-26 ENCOUNTER — Telehealth: Payer: Self-pay | Admitting: *Deleted

## 2017-08-26 NOTE — Telephone Encounter (Signed)
This RN received call from pt stating she was informed by her apartment complex of policy that which she would have to get rid of her dog unless she completed a form stating per medical issues the dog is considered " therapeutic ".  " I have papers showing he is therapeutic for my disability"  " I misunderstood and completed the form myself and then I was told because they contacted your office that the form was falsified "  Rhonda Moss stated concern due to above issue.  This RN informed Rhonda Moss copy of form was received from her apartment complex but did not have correct signiture.  This RN discussed with pt if she can bring the papers in to this office - MD could complete appropriately for her.  Rhonda Moss will either bring the papers in to the office or fax them to this Pod's fax machine.  She is requesting " if these can be completed quickly so I do not have to get rid of my dog ".  Return call number given as [phone].

## 2017-08-28 ENCOUNTER — Telehealth: Payer: Self-pay

## 2017-08-28 NOTE — Telephone Encounter (Signed)
Requested forms faxed to provided fax number per pt request.

## 2018-03-17 ENCOUNTER — Telehealth: Payer: Self-pay

## 2018-03-17 ENCOUNTER — Ambulatory Visit: Payer: BLUE CROSS/BLUE SHIELD | Admitting: Medical

## 2018-03-17 NOTE — Telephone Encounter (Signed)
Received call from patient "I am a patient of Dr. Jana Hakim and I don't have a primary care. He's the only doctor that I see. I've had a cold and cough for 8-9 days. I've taken everything over the counter and nothing seems to get the mucus up. I've tried robitussin, nyquil, dayquil, cough medicine; nothing seems to help. Is there something that you can recommend that I do or take?"   Patient denies fever, chills, or shortness of breath.   RN consulted with PA. Advice given for patient to try Flonase and/or Claritin   Patient called back reporting she has tried Flonase and did not have relief. Patient verbalized "I really don't think it's allergies because I take Zyrtec everyday." Patient reports greenish dark colored sputum at onset of symptoms and "a rattling in my chest when I breathe in" currently. Scheduling message sent for 03/18/18 Symptom Management appointment.

## 2018-03-18 ENCOUNTER — Encounter: Payer: BLUE CROSS/BLUE SHIELD | Admitting: Medical

## 2018-03-18 ENCOUNTER — Telehealth: Payer: Self-pay | Admitting: Medical

## 2018-03-18 NOTE — Telephone Encounter (Signed)
Patient called to cancel °

## 2018-03-19 NOTE — Progress Notes (Signed)
No show

## 2018-03-22 ENCOUNTER — Ambulatory Visit (HOSPITAL_COMMUNITY)
Admission: EM | Admit: 2018-03-22 | Discharge: 2018-03-22 | Disposition: A | Discharge status: Home / Self Care | Payer: BLUE CROSS/BLUE SHIELD | Attending: Family Medicine | Admitting: Family Medicine

## 2018-03-22 ENCOUNTER — Encounter (HOSPITAL_COMMUNITY): Payer: Self-pay | Admitting: Emergency Medicine

## 2018-03-22 ENCOUNTER — Other Ambulatory Visit: Payer: Self-pay

## 2018-03-22 DIAGNOSIS — R059 Cough, unspecified: Secondary | ICD-10-CM

## 2018-03-22 DIAGNOSIS — R05 Cough: Secondary | ICD-10-CM

## 2018-03-22 MED ORDER — FLUCONAZOLE 150 MG PO TABS: ORAL_TABLET | ORAL | 1 refills | Status: DC

## 2018-03-22 MED ORDER — AZITHROMYCIN 250 MG PO TABS: 250.0000 mg | ORAL_TABLET | Freq: Every day | ORAL | 0 refills | Status: DC

## 2018-03-22 NOTE — ED Triage Notes (Signed)
The patient presented to the [facility] with a complaint of a cough, congestion and nasal drainage x 2 weeks.

## 2018-03-24 NOTE — ED Provider Notes (Signed)
[facility]   [id number] 03/22/18 Arrival Time: 0131  ASSESSMENT & PLAN:  1. Cough     Meds ordered this encounter  Medications  . azithromycin (ZITHROMAX) 250 MG tablet    Sig: Take 1 tablet (250 mg total) by mouth daily. Take first 2 tablets together, then 1 every day until finished.    Dispense:  6 tablet    Refill:  0  . fluconazole (DIFLUCAN) 150 MG tablet    Sig: Take one tablet by mouth as a single dose.    Dispense:  1 tablet    Refill:  1   Given duration of symptoms, will treat. She requests Rx Diflucan. Discussed typical duration of symptoms. OTC symptom care as needed. May f/u with PCP or here as needed.  Reviewed expectations re: course of current medical issues. Questions answered. Outlined signs and symptoms indicating need for more acute intervention. Patient verbalized understanding. After Visit Summary given.   SUBJECTIVE: History from: patient.  Rhonda Moss is a 47 y.o. female who presents with complaint of nasal congestion, post-nasal drainage, and a persistent dry cough. Cough bothering her the most. Onset abrupt, approximately 2 weeks ago, maybe longer. SOB: none. Wheezing: none. Fever: no. Overall normal PO intake without n/v. Sick contacts: no. OTC treatment without relief.  Social History   Tobacco Use  Smoking Status Never Smoker    ROS: As per HPI.   OBJECTIVE:  Vitals:   03/22/18 1211  BP: (!) 152/92  Pulse: (!) 58  Resp: 18  Temp: 98 F (36.7 C)  TempSrc: Oral  SpO2: 100%    General appearance: alert HEENT: nasal congestion; clear runny nose; throat irritation secondary to post-nasal drainage Neck: supple without LAD Lungs: unlabored respirations, symmetrical air entry without wheezing; cough: moderate; no respiratory distress Skin: warm and dry Psychological: alert and cooperative; normal mood and affect   Allergies  Allergen Reactions  . Sulfa Antibiotics Hives    Past Medical History:  Diagnosis  Date  . Allergy   . Anxiety   . Breast cancer ([facility]) dx. 44 04/11/15   ILC of left breast  . Colon polyps   . Ectopic pregnancy   . Family history of breast cancer   . Family history of kidney cancer    Family History  Problem Relation Age of Onset  . Breast cancer Mother 9  . Breast cancer Sister 33  . Breast cancer Maternal Aunt 60       +calcifications  . Prostate cancer Maternal Uncle 67  . Heart attack Maternal Grandmother   . Heart attack Maternal Grandfather   . Prostate cancer Maternal Grandfather 103  . Diabetes Paternal Grandmother   . Pancreatitis Paternal Grandfather   . Prostate cancer Cousin        dx. late 11s   Social History   Socioeconomic History  . Marital status: Divorced    Spouse name: Not on file  . Number of children: 2  . Years of education: Not on file  . Highest education level: Not on file  Occupational History  . Not on file  Social Needs  . Financial resource strain: Not on file  . Food insecurity:    Worry: Not on file    Inability: Not on file  . Transportation needs:    Medical: Not on file    Non-medical: Not on file  Tobacco Use  . Smoking status: Never Smoker  Substance and Sexual Activity  . Alcohol use: Yes  Alcohol/week: 0.0 oz    Comment: social  . Drug use: No  . Sexual activity: Yes    Birth control/protection: None  Lifestyle  . Physical activity:    Days per week: Not on file    Minutes per session: Not on file  . Stress: Not on file  Relationships  . Social connections:    Talks on phone: Not on file    Gets together: Not on file    Attends religious service: Not on file    Active member of club or organization: Not on file    Attends meetings of clubs or organizations: Not on file    Relationship status: Not on file  . Intimate partner violence:    Fear of current or ex partner: Not on file    Emotionally abused: Not on file    Physically abused: Not on file    Forced sexual activity: Not on file    Other Topics Concern  . Not on file  Social History Narrative  . Not on file           Vanessa Kick, MD 03/24/18 [phone]

## 2018-04-09 ENCOUNTER — Other Ambulatory Visit: Payer: Self-pay | Admitting: Oncology

## 2018-04-09 DIAGNOSIS — Z853 Personal history of malignant neoplasm of breast: Secondary | ICD-10-CM

## 2018-05-05 ENCOUNTER — Ambulatory Visit
Admission: RE | Admit: 2018-05-05 | Discharge: 2018-05-05 | Disposition: A | Discharge status: Home / Self Care | Payer: BLUE CROSS/BLUE SHIELD | Source: Ambulatory Visit | Attending: Oncology | Admitting: Oncology

## 2018-05-05 ENCOUNTER — Ambulatory Visit: Admission: RE | Admit: 2018-05-05 | Payer: BLUE CROSS/BLUE SHIELD | Source: Ambulatory Visit

## 2018-05-05 DIAGNOSIS — Z853 Personal history of malignant neoplasm of breast: Secondary | ICD-10-CM

## 2018-05-26 ENCOUNTER — Telehealth: Payer: Self-pay | Admitting: Oncology

## 2018-05-26 NOTE — Telephone Encounter (Signed)
Called regarding date change

## 2018-06-30 ENCOUNTER — Other Ambulatory Visit: Payer: Medicaid Other

## 2018-07-06 ENCOUNTER — Inpatient Hospital Stay: Payer: BLUE CROSS/BLUE SHIELD | Attending: Oncology

## 2018-07-06 DIAGNOSIS — Z170 Estrogen receptor positive status [ER+]: Secondary | ICD-10-CM | POA: Diagnosis not present

## 2018-07-06 DIAGNOSIS — F419 Anxiety disorder, unspecified: Secondary | ICD-10-CM | POA: Insufficient documentation

## 2018-07-06 DIAGNOSIS — Z79810 Long term (current) use of selective estrogen receptor modulators (SERMs): Secondary | ICD-10-CM | POA: Insufficient documentation

## 2018-07-06 DIAGNOSIS — Z79899 Other long term (current) drug therapy: Secondary | ICD-10-CM | POA: Diagnosis not present

## 2018-07-06 DIAGNOSIS — C50412 Malignant neoplasm of upper-outer quadrant of left female breast: Secondary | ICD-10-CM

## 2018-07-06 LAB — CBC WITH DIFFERENTIAL/PLATELET
Basophils Absolute: 0 10*3/uL (ref 0.0–0.1)
Basophils Relative: 1 %
Eosinophils Absolute: 0 10*3/uL (ref 0.0–0.5)
Eosinophils Relative: 1 %
HCT: 34.7 % — ABNORMAL LOW (ref 34.8–46.6)
HEMOGLOBIN: 11.4 g/dL — AB (ref 11.6–15.9)
LYMPHS ABS: 1.6 10*3/uL (ref 0.9–3.3)
LYMPHS PCT: 40 %
MCH: 31 pg (ref 25.1–34.0)
MCHC: 32.9 g/dL (ref 31.5–36.0)
MCV: 94.3 fL (ref 79.5–101.0)
Monocytes Absolute: 0.3 10*3/uL (ref 0.1–0.9)
Monocytes Relative: 7 %
NEUTROS ABS: 2.1 10*3/uL (ref 1.5–6.5)
NEUTROS PCT: 51 %
Platelets: 209 10*3/uL (ref 145–400)
RBC: 3.68 MIL/uL — AB (ref 3.70–5.45)
RDW: 13.8 % (ref 11.2–14.5)
WBC: 4.1 10*3/uL (ref 3.9–10.3)

## 2018-07-06 LAB — COMPREHENSIVE METABOLIC PANEL
ALT: 11 U/L (ref 0–44)
ANION GAP: 5 (ref 5–15)
AST: 17 U/L (ref 15–41)
Albumin: 3.5 g/dL (ref 3.5–5.0)
Alkaline Phosphatase: 57 U/L (ref 38–126)
BUN: 11 mg/dL (ref 6–20)
CHLORIDE: 109 mmol/L (ref 98–111)
CO2: 28 mmol/L (ref 22–32)
Calcium: 8.9 mg/dL (ref 8.9–10.3)
Creatinine, Ser: 1.02 mg/dL — ABNORMAL HIGH (ref 0.44–1.00)
Glucose, Bld: 101 mg/dL — ABNORMAL HIGH (ref 70–99)
POTASSIUM: 4.6 mmol/L (ref 3.5–5.1)
Sodium: 142 mmol/L (ref 135–145)
Total Bilirubin: 0.6 mg/dL (ref 0.3–1.2)
Total Protein: 7 g/dL (ref 6.5–8.1)

## 2018-07-07 ENCOUNTER — Ambulatory Visit: Payer: Medicaid Other | Admitting: Oncology

## 2018-07-12 NOTE — Progress Notes (Signed)
[place]  [phone] Fax:([phone]     ID: Rhonda Moss DOB: 31-Mar-1971  MR#: [id number]  GUY#:[id number]  Patient Care Team: Patient, No Pcp Per as PCP - General (General Practice) Letrice Pollok, Virgie Dad, MD as Consulting Physician (Oncology) Coralie Keens, MD as Consulting Physician (General Surgery) Thea Silversmith, MD as Consulting Physician (Radiation Oncology) Sylvan Cheese, NP as Nurse Practitioner (Hematology and Oncology) PCP: Patient, No Pcp Per OTHER MD:   CHIEF COMPLAINT: Estrogen receptor positive left breast cancer  CURRENT TREATMENT:  Tamoxifen   BREAST CANCER HISTORY: From the original intake note:  The patient had routine bilateral screening mammography at the breast center 03/13/2015 showing a possible mass in the left breast. Left diagnostic mammography with tomosynthesis and left breast ultrasonography 04/04/2015 found the breast density to be category C area did in the left breast upper outer quadrant there was a persistent mass which by ultrasound was irregular and hypoechoic. It measured 0.7 cm. In the left axilla there was a normal size lymph node with eccentric cortical thickening measuring 4 mm in maximal thickness.  On 04/11/2015 the patient underwent biopsy of the left breast mass in question and this showed (SAA [id number]) and invasive lobular carcinoma, E-cadherin negative, grade 1 or 2, estrogen receptor 99% positive, progesterone receptor 90% positive, both with strong staining intensity, with an MIB-1 of 19%. HER-2 was not amplified, the signals ratio being 1.09 and the number per cell 1.90.  The patient's case was presented at the multidisciplinary breast cancer conference 04/18/2015. At that time it was recommended that the patient be referred to genetics and undergo breast MRI.   Her subsequent history is as detailed below  INTERVAL HISTORY: Rhonda Moss returns today for follow-up and treatment of her  estrogen receptor positive breast cancer. She continues on tamoxifen, with good tolerance. She is not having as many hot flashes at night for the last 2-3 months. Her hot flashes occur at unpredictable times during the day. Her LMP was in April 2019. Her gynecologist  recently retired.   Since her last visit, she underwent diagnostic bilateral mammography with CAD and tomography on 05/05/2018 at [facility] showing: breast density category B. There was no evidence of malignancy.   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 Her son is graduating from high school in 2020 and was accepted into [employer] on a full-ride baseball scholarship. She just moved into a new house 1 month ago. She works out 3-4 days per week. She also switched to a low-carb diet and lost 51 lbs. She denies unusual headaches, visual changes, nausea, vomiting, or dizziness. There has been no unusual cough, phlegm production, or pleurisy. There has been no change in bowel or bladder habits. She denies unexplained fatigue or unexplained weight loss, bleeding, rash, or fever. A detailed review of systems was otherwise stable.    PAST MEDICAL HISTORY: Past Medical History:  Diagnosis Date  . Allergy   . Anxiety   . Breast cancer ([place]) dx. 44 04/11/15   ILC of left breast  . Colon polyps   . Ectopic pregnancy   . Family history of breast cancer   . Family history of kidney cancer     PAST SURGICAL HISTORY: Past Surgical History:  Procedure Laterality Date  . BREAST LUMPECTOMY     left august 2016  . BREAST LUMPECTOMY WITH RADIOACTIVE SEED AND SENTINEL LYMPH NODE BIOPSY Left 06/22/2015  Procedure: LEFT BREAST LUMPECTOMY WITH RADIOACTIVE SEED AND SENTINEL LYMPH NODE BIOPSY;  Surgeon: Coralie Keens, MD;  Location: [facility];  Service: General;  Laterality: Left;  . CERVICAL BIOPSY  W/ LOOP ELECTRODE EXCISION    . CESAREAN SECTION      . CHOLECYSTECTOMY    . LYMPHADENECTOMY  1992  . TUBAL LIGATION  2009    FAMILY HISTORY Family History  Problem Relation Age of Onset  . Breast cancer Mother 78  . Breast cancer Sister 33  . Breast cancer Maternal Aunt 60       +calcifications  . Prostate cancer Maternal Uncle 67  . Heart attack Maternal Grandmother   . Heart attack Maternal Grandfather   . Prostate cancer Maternal Grandfather 26  . Diabetes Paternal Grandmother   . Pancreatitis Paternal Grandfather   . Prostate cancer Cousin        dx. late 69s  the patient's parents are living, in their late 71s. The patient has one brother, and one sister. The patient's mother was diagnosed with breast cancer at age 1 and  One of the patient's mother's 2 sisters was also diagnosed with breast cancer at age 64. The patient's on sister was diagnosed with breast cancer at age 72. She was tested for the BRCA gene mutations and was negative.  GYNECOLOGIC HISTORY:  No LMP recorded. (Menstrual status: Other). Menarche age 74 first live birth age 28. The patient is GX P2. Her periods are regular, lasting between 5 and 7 days. They're now slightly better controlled on a NuvaRing IUD.   SOCIAL HISTORY: (Updated August 2018) [place] does real estate selling and appraising. Her job involves a great deal of computer work and some driving, occasionally some walking. In October 2017 she married Rhonda Moss, who works for [employer]. At home her son Rhonda Moss, currently 58 years old, is very active and baseball and several coaches from [place] are according him. The patient's daughter Rhonda Moss, will graduate from recreational therapy this December and proceed to a graduate degree. sometimes stays at home. She is studying sports medicine.    ADVANCED DIRECTIVES: Not in place. At the initial clinic visit 07/13/2018 the patient was given the appropriate forms to complete and notarize at her discretion.  HEALTH MAINTENANCE: Social History    Tobacco Use  . Smoking status: Never Smoker  Substance Use Topics  . Alcohol use: Yes    Alcohol/week: 0.0 standard drinks    Comment: social  . Drug use: No     Colonoscopy:  PAP: NOV 2015  Bone density:  Lipid panel:  Allergies  Allergen Reactions  . Sulfa Antibiotics Hives    Current Outpatient Medications  Medication Sig Dispense Refill  . ALPRAZolam (XANAX) 0.5 MG tablet Take 0.5 mg by mouth at bedtime as needed for anxiety. Reported on 01/09/2016    . azithromycin (ZITHROMAX) 250 MG tablet Take 1 tablet (250 mg total) by mouth daily. Take first 2 tablets together, then 1 every day until finished. 6 tablet 0  . cetirizine (ZYRTEC) 10 MG tablet Take 10 mg by mouth daily.    . fluconazole (DIFLUCAN) 150 MG tablet Take one tablet by mouth as a single dose. 1 tablet 1  . tamoxifen (NOLVADEX) 20 MG tablet Take 1 tablet (20 mg total) by mouth daily. 90 tablet 3   No current facility-administered medications for this visit.     OBJECTIVE: Middle-aged African-American woman in no acute distress  Vitals:   07/13/18 1000  BP: (!) 152/93  Pulse: 64  Resp: 18  Temp: 97.8 F (36.6 C)  SpO2: 100%     Body mass index is 37.28 kg/m.    ECOG FS:0 - Asymptomatic  Sclerae unicteric, pupils round and equal Oropharynx clear and moist No cervical or supraclavicular adenopathy Lungs no rales or rhonchi Heart regular rate and rhythm Abd soft, nontender, positive bowel sounds MSK no focal spinal tenderness, no upper extremity lymphedema Neuro: nonfocal, well oriented, appropriate affect Breasts: The right breast is unremarkable.  The left breast is status post lumpectomy followed by radiation, with no evidence of local recurrence.  Both axillae are benign.  LAB RESULTS:  CMP     Component Value Date/Time   NA 142 07/06/2018 1115   NA 140 06/24/2017 1130   K 4.6 07/06/2018 1115   K 4.5 06/24/2017 1130   CL 109 07/06/2018 1115   CO2 28 07/06/2018 1115   CO2 27 06/24/2017  1130   GLUCOSE 101 (H) 07/06/2018 1115   GLUCOSE 90 06/24/2017 1130   BUN 11 07/06/2018 1115   BUN 13.2 06/24/2017 1130   CREATININE 1.02 (H) 07/06/2018 1115   CREATININE 1.1 06/24/2017 1130   CALCIUM 8.9 07/06/2018 1115   CALCIUM 9.6 06/24/2017 1130   PROT 7.0 07/06/2018 1115   PROT 7.5 06/24/2017 1130   ALBUMIN 3.5 07/06/2018 1115   ALBUMIN 3.5 06/24/2017 1130   AST 17 07/06/2018 1115   AST 25 06/24/2017 1130   ALT 11 07/06/2018 1115   ALT 19 06/24/2017 1130   ALKPHOS 57 07/06/2018 1115   ALKPHOS 69 06/24/2017 1130   BILITOT 0.6 07/06/2018 1115   BILITOT 0.70 06/24/2017 1130   GFRNONAA >60 07/06/2018 1115   GFRAA >60 07/06/2018 1115    INo results found for: SPEP, UPEP  Lab Results  Component Value Date   WBC 4.1 07/06/2018   NEUTROABS 2.1 07/06/2018   HGB 11.4 (L) 07/06/2018   HCT 34.7 (L) 07/06/2018   MCV 94.3 07/06/2018   PLT 209 07/06/2018      Chemistry      Component Value Date/Time   NA 142 07/06/2018 1115   NA 140 06/24/2017 1130   K 4.6 07/06/2018 1115   K 4.5 06/24/2017 1130   CL 109 07/06/2018 1115   CO2 28 07/06/2018 1115   CO2 27 06/24/2017 1130   BUN 11 07/06/2018 1115   BUN 13.2 06/24/2017 1130   CREATININE 1.02 (H) 07/06/2018 1115   CREATININE 1.1 06/24/2017 1130      Component Value Date/Time   CALCIUM 8.9 07/06/2018 1115   CALCIUM 9.6 06/24/2017 1130   ALKPHOS 57 07/06/2018 1115   ALKPHOS 69 06/24/2017 1130   AST 17 07/06/2018 1115   AST 25 06/24/2017 1130   ALT 11 07/06/2018 1115   ALT 19 06/24/2017 1130   BILITOT 0.6 07/06/2018 1115   BILITOT 0.70 06/24/2017 1130       No results found for: LABCA2  No components found for: LABCA125  No results for input(s): INR in the last 168 hours.  Urinalysis    Component Value Date/Time   COLORURINE YELLOW 12/03/2014 1415   APPEARANCEUR CLEAR 12/03/2014 1415   LABSPEC 1.024 12/03/2014 1415   PHURINE 5.5 12/03/2014 1415   GLUCOSEU NEGATIVE 12/03/2014 1415   HGBUR NEGATIVE  12/03/2014 1415   BILIRUBINUR NEGATIVE 12/03/2014 1415   KETONESUR 15 (A) 12/03/2014 1415   PROTEINUR NEGATIVE 12/03/2014 1415   UROBILINOGEN 0.2 12/03/2014 1415   NITRITE NEGATIVE 12/03/2014 1415  LEUKOCYTESUR NEGATIVE 12/03/2014 1415    STUDIES: Since her last visit, she underwent diagnostic bilateral mammography with CAD and tomography on 05/05/2018 at [facility] showing: breast density category B. There was no evidence of malignancy.    ASSESSMENT: 47 y.o. [place] status post left breast upper outer quadrant biopsy 04/11/2015 for a clinical T1b NX invasive lobular breast cancer, grade 1 or 2, strongly estrogen and progesterone receptor positive, HER-2 not amplified, with an MIB-1 of 19%.  (1) genetics testing 05/04/2015 through the [facility] panel/ [facility] showed no deleterious mutations in ATM, BARD1, BRCA1, BRCA2, BRIP1, CDH1, CHEK2, FANCC, MLH1, MSH2, MSH6, NBN, PALB2, PMS2, PTEN, RAD51C, RAD51D, STK11, TP53, and XRCC2. Furthermore, deletion/duplication analysis (without next-generation sequencing) was performed for the EPCAM gene.   (2) status post left lumpectomy and sentinel lymph node sampling 06/22/2015 for a pT1b pN0, stage iA invasive lobular breast cancer, grade 1, repeat HER-2 again negative, with ample margins   (3)  Continuing adjuvant tamoxifen, started neoadjuvantly June 2016  (4) Oncotype DX score of 22 predicts a risk of recurrence outside the breast within the next 10 years of 14% if the patient's only systemic treatment is tamoxifen for 5 years.   (a)  The patient opted to forego chemotherapy given the very marginal anticipated benefit  (5)  Adjuvant radiation 08/13/15-09/25/15:  Left breast/ 45 Gy at 1.8 Gy per fraction x 25 fractions.  Left breast boost/ 14 Gy at 2 Gy per fraction x 8 fractions  PLAN: Bena is now 3 years out from definitive surgery for her breast cancer with no evidence of disease recurrence.  This  is very favorable.  She is tolerating tamoxifen generally well.  Hot flashes are a problem.  I think she would benefit from venlafaxine.  In addition she is having some moodiness and irritability.  This is not at all uncommon in menopause.  The venlafaxine out to be helpful with that as well.  She requested a refill on her Xanax.  I gave her 10 tablets with no refills.  We again discussed contraception.  She is not interested in a Mirena IUD which was my recommendation.  She would like to have her tubes tied.  I think she needs a good gynecologic evaluation and I am referring her to Dr. Sabra Heck with that in mind  Otherwise Clark will return to see me in 1 year.  She knows to call for any issues that may develop before that visit.   Axcel Horsch, Virgie Dad, MD  07/13/18 10:10 AM Medical Oncology and Hematology [facility] [address] [place], [place] [address] Tel. [phone]    Fax. [phone]  Alice Rieger, am acting as scribe for Chauncey Cruel MD.  I, Lurline Del MD, have reviewed the above documentation for accuracy and completeness, and I agree with the above.

## 2018-07-13 ENCOUNTER — Inpatient Hospital Stay (HOSPITAL_BASED_OUTPATIENT_CLINIC_OR_DEPARTMENT_OTHER): Payer: BLUE CROSS/BLUE SHIELD | Admitting: Oncology

## 2018-07-13 ENCOUNTER — Encounter: Payer: Self-pay | Admitting: Oncology

## 2018-07-13 VITALS — BP 152/93 | HR 64 | Temp 97.8°F | Resp 18 | Ht 67.0 in | Wt 238.0 lb

## 2018-07-13 DIAGNOSIS — C50412 Malignant neoplasm of upper-outer quadrant of left female breast: Secondary | ICD-10-CM | POA: Diagnosis not present

## 2018-07-13 DIAGNOSIS — Z170 Estrogen receptor positive status [ER+]: Secondary | ICD-10-CM | POA: Diagnosis not present

## 2018-07-13 DIAGNOSIS — F419 Anxiety disorder, unspecified: Secondary | ICD-10-CM

## 2018-07-13 DIAGNOSIS — Z79810 Long term (current) use of selective estrogen receptor modulators (SERMs): Secondary | ICD-10-CM | POA: Diagnosis not present

## 2018-07-13 DIAGNOSIS — Z79899 Other long term (current) drug therapy: Secondary | ICD-10-CM | POA: Diagnosis not present

## 2018-07-13 MED ORDER — ALPRAZOLAM 0.5 MG PO TABS: 0.5000 mg | ORAL_TABLET | Freq: Every evening | ORAL | 0 refills | Status: DC | PRN

## 2018-07-13 MED ORDER — VENLAFAXINE HCL ER 75 MG PO CP24: 75.0000 mg | ORAL_CAPSULE | Freq: Every day | ORAL | 4 refills | Status: DC

## 2018-07-13 NOTE — Progress Notes (Signed)
I'm glad to see her and see how I can help.  My front office manager will reach out to her today.  Thanks.

## 2018-08-04 ENCOUNTER — Other Ambulatory Visit: Payer: Self-pay | Admitting: Oncology

## 2018-08-17 ENCOUNTER — Other Ambulatory Visit (HOSPITAL_COMMUNITY)
Admission: RE | Admit: 2018-08-17 | Discharge: 2018-08-17 | Disposition: A | Discharge status: Home / Self Care | Payer: BLUE CROSS/BLUE SHIELD | Source: Ambulatory Visit | Attending: Obstetrics & Gynecology | Admitting: Obstetrics & Gynecology

## 2018-08-17 ENCOUNTER — Ambulatory Visit (INDEPENDENT_AMBULATORY_CARE_PROVIDER_SITE_OTHER): Payer: BLUE CROSS/BLUE SHIELD | Admitting: Obstetrics & Gynecology

## 2018-08-17 ENCOUNTER — Encounter: Payer: Self-pay | Admitting: Obstetrics & Gynecology

## 2018-08-17 ENCOUNTER — Other Ambulatory Visit: Payer: Self-pay

## 2018-08-17 VITALS — BP 124/82 | HR 64 | Resp 16 | Ht 66.5 in | Wt 239.0 lb

## 2018-08-17 DIAGNOSIS — Z124 Encounter for screening for malignant neoplasm of cervix: Secondary | ICD-10-CM | POA: Diagnosis not present

## 2018-08-17 DIAGNOSIS — Z01419 Encounter for gynecological examination (general) (routine) without abnormal findings: Secondary | ICD-10-CM

## 2018-08-17 DIAGNOSIS — N912 Amenorrhea, unspecified: Secondary | ICD-10-CM

## 2018-08-17 DIAGNOSIS — Z1151 Encounter for screening for human papillomavirus (HPV): Secondary | ICD-10-CM | POA: Insufficient documentation

## 2018-08-17 DIAGNOSIS — Z0000 Encounter for general adult medical examination without abnormal findings: Secondary | ICD-10-CM

## 2018-08-17 LAB — POCT URINE PREGNANCY: PREG TEST UR: NEGATIVE

## 2018-08-17 NOTE — Progress Notes (Signed)
47 y.o. [id number] Divorced Black or [place] American female here for new patient annual and to discuss long term contraception options.  Pt was diagnosed with ER/PR+, neg Her-2 invasive lobular breast cancer 6/19 treated with lumpectomy, SNB.  Stage 1A, Grade 1.  Did not receive chemotherapy.  Did receive radiation.  Will be on Tamoxifen x 5 years.  Followed by Dr. Jana Hakim.  Doing well with Tamoxifen.  Genetic testing was negative.    Pt ready to proceed with definitive contraception.  Reports she was followed by Dr. Ruthann Cancer for years and was advised she needed to lose weight prior to having tubal ligation.  Once she did lose weight, he advised her he did not want to proceed due to possible risks of adhesions from prior surgeries.  Pt reports she was very frustrated by this.    Does not want to use any hormonal treatment.  Has considered IUD.  Has hx of recurrent vaginitis especially with using the Nuva ring.  She just does not want to use anything that could increase this risk as she has not had any vaginitis issues for the past few years.    She reports she has worked on weight loss.  Has lost about 55 pounds.      Patient's last menstrual period was 03/17/2017 (approximate).          Sexually active: Yes.    The current method of family planning is coitus interruptus.    Exercising: Yes.    cardio, strength training Smoker:  no  Health Maintenance: Pap:  2017 Normal  History of abnormal Pap:  Yes, LEEP 1991 MMG:  05/05/18 Diagnostic Bilateral BIRADS2:Benign. F/u 1 year  Colonoscopy:  Reviewed american cancer society guidelines BMD:   Never TDaP:  Unsure.  Advised to receive within 72 hours with any exposure.  Declines today having update Most recent lab work was with Dr. Jana Hakim   reports that she has never smoked. She has never used smokeless tobacco. She reports that she drinks about 1.0 standard drinks of alcohol per week. She reports that she does not use drugs.  Past Medical History:   Diagnosis Date  . Allergy   . Anxiety   . Breast cancer ([place]) dx. 47 04/11/15   ILC of left breast  . Colon polyps   . Ectopic pregnancy   . Family history of breast cancer   . Family history of kidney cancer     Past Surgical History:  Procedure Laterality Date  . BREAST LUMPECTOMY     left august 2016  . BREAST LUMPECTOMY WITH RADIOACTIVE SEED AND SENTINEL LYMPH NODE BIOPSY Left 06/22/2015   Procedure: LEFT BREAST LUMPECTOMY WITH RADIOACTIVE SEED AND SENTINEL LYMPH NODE BIOPSY;  Surgeon: Coralie Keens, MD;  Location: [facility];  Service: General;  Laterality: Left;  . CERVICAL BIOPSY  W/ LOOP ELECTRODE EXCISION  1991  . CESAREAN SECTION    . CHOLECYSTECTOMY    . LYMPHADENECTOMY  1992  . NM RENAL LASIX ([facility] HX)  2008  . SALPINGECTOMY Left 10/2005  . STRABISMUS SURGERY  2016    Current Outpatient Medications  Medication Sig Dispense Refill  . ALPRAZolam (XANAX) 0.5 MG tablet Take 1 tablet (0.5 mg total) by mouth at bedtime as needed for anxiety. Reported on 01/09/2016 10 tablet 0  . ibuprofen (ADVIL,MOTRIN) 200 MG tablet Take 200 mg by mouth every 8 (eight) hours as needed.    . tamoxifen (NOLVADEX) 20 MG tablet TAKE 1 TABLET BY MOUTH EVERY  DAY 90 tablet 3  . cetirizine (ZYRTEC) 10 MG tablet Take 10 mg by mouth daily.     No current facility-administered medications for this visit.     Family History  Problem Relation Age of Onset  . Breast cancer Mother 85  . Breast cancer Sister 60  . Breast cancer Maternal Aunt 60       +calcifications  . Prostate cancer Maternal Uncle 67  . Heart attack Maternal Grandmother   . Heart attack Maternal Grandfather   . Prostate cancer Maternal Grandfather 16  . Diabetes Paternal Grandmother   . Pancreatitis Paternal Grandfather   . Prostate cancer Cousin        dx. late 42s    Review of Systems  Genitourinary: Positive for dyspareunia.  All other systems reviewed and are negative.   Exam:   BP 124/82 (BP  Location: Right Arm, Patient Position: Sitting, Cuff Size: Large)   Pulse 64   Resp 16   Ht 5' 6.5" (1.689 m)   Wt 239 lb (108.4 kg)   LMP 03/17/2017 (Approximate)   BMI 38.00 kg/m     Height: 5' 6.5" (168.9 cm)  Ht Readings from Last 3 Encounters:  08/17/18 5' 6.5" (1.689 m)  07/13/18 _0  (1.702 m)  07/01/17 _1  (1.702 m)    General appearance: alert, cooperative and appears stated age Head: Normocephalic, without obvious abnormality, atraumatic Neck: no adenopathy, supple, symmetrical, trachea midline and thyroid normal to inspection and palpation Lungs: clear to auscultation bilaterally Breasts: left well healed incisions with mild radiation changes, no LAD; right breast without masses, skin changes, LAD Heart: regular rate and rhythm Abdomen: soft, non-tender; bowel sounds normal; no masses,  no organomegaly Extremities: extremities normal, atraumatic, no cyanosis or edema Skin: Skin color, texture, turgor normal. No rashes or lesions Lymph nodes: Cervical, supraclavicular, and axillary nodes normal. No abnormal inguinal nodes palpated Neurologic: Grossly normal  Pelvic: External genitalia:  no lesions              Urethra:  normal appearing urethra with no masses, tenderness or lesions              Bartholins and Skenes: normal                 Vagina: normal appearing vagina with normal color and discharge, no lesions              Cervix: no lesions              Pap taken: Yes.   Bimanual Exam:  Uterus:  normal size, contour, position, consistency, mobility, non-tender              Adnexa: normal adnexa and no mass, fullness, tenderness               Rectovaginal: declines                Anus:  No visible lesions  D/w pt options for contraception.  Other than condoms, Paraguard IUD is an option but she is not interested in IUD use.  Right salpingectomy (if possible) discussed for surgical sterilization  Decreased ovarian cancer with salpingectomy discussed.  Surgical  risks reviewed including bleeding, infection, bowel/bladder/uretral injury, transfusion risk, DVT/PE risks reviewed.  Rare failure risk reviewed.  Possibilty of only being able to do tubal ligation if significant adhesions are present was also reviewed.    Chaperone was present for exam.  A:  Well Woman with normal exam H/o  Stage IA, Grade 1, ER/PR+ breast cancer, s/p lumpectomy and SNB, then radiation (no chemo) and now on Tamoxifen x 5 years Desires permanent contraception H/O cesarean section H/O left salpingectomy with ectopic pregnancy 12/06   P:   Mammogram guidelines reviewed pap smear and HR HPV obtained today Will proceed with scheduling right salpingectomy for permanent contraception Vit D, TSH, Lipids obtained return annually or prn  In addition to AEX, additional time spent options for treatment.  She is desirous of definitive treatment if possible.

## 2018-08-18 LAB — VITAMIN D 25 HYDROXY (VIT D DEFICIENCY, FRACTURES): Vit D, 25-Hydroxy: 22.5 ng/mL — ABNORMAL LOW (ref 30.0–100.0)

## 2018-08-18 LAB — CYTOLOGY - PAP
Diagnosis: NEGATIVE
HPV: NOT DETECTED

## 2018-08-18 LAB — LIPID PANEL
CHOLESTEROL TOTAL: 169 mg/dL (ref 100–199)
Chol/HDL Ratio: 2.8 ratio (ref 0.0–4.4)
HDL: 61 mg/dL (ref >39)
LDL CALC: 88 mg/dL (ref 0–99)
Triglycerides: 102 mg/dL (ref 0–149)
VLDL CHOLESTEROL CAL: 20 mg/dL (ref 5–40)

## 2018-08-18 LAB — TSH: TSH: 0.795 u[IU]/mL (ref 0.450–4.500)

## 2018-08-19 ENCOUNTER — Encounter: Payer: Self-pay | Admitting: Obstetrics & Gynecology

## 2018-09-01 ENCOUNTER — Telehealth: Payer: Self-pay | Admitting: Obstetrics & Gynecology

## 2018-09-01 NOTE — Telephone Encounter (Signed)
Spoke with patient regarding benefit for surgery. Patient understood and agreeable. Patient aware this is professional benefit only. Patient aware will be contacted by hospital for separate benefits.  Patient confirms she is ready to proceed, but patient adds, from Wednesday 08/25/18 through 08/31/18, she had her first cycle in 17 months. Patient states "it was awful, like someone opened the flood gates". Patient states she wants to know if another procedure can be done "to permanently take care of this, like a hysterectomy?"  Advised patient I will forward this information to our Nurse Supervisor to review.  Forwarding to Lamont Snowball, RN

## 2018-09-03 NOTE — Telephone Encounter (Signed)
Annual exam on 08-17-18, patient discussed laparoscopic right salpingectomy for sterilization.    Call to patient. Per ROI, can leave message on voice mail. Left message to call back to discuss surgery planning.

## 2018-09-23 NOTE — Telephone Encounter (Signed)
Follow-up call to patient. States she is very interested in hysterectomy.  Advised consult needed to discuss options. Appointment scheduled for 09-27-18.  Routing to Dr Sabra Heck. Encounter closed.

## 2018-09-27 ENCOUNTER — Encounter: Payer: Self-pay | Admitting: Obstetrics & Gynecology

## 2018-09-27 ENCOUNTER — Ambulatory Visit (INDEPENDENT_AMBULATORY_CARE_PROVIDER_SITE_OTHER): Payer: BLUE CROSS/BLUE SHIELD | Admitting: Obstetrics & Gynecology

## 2018-09-27 ENCOUNTER — Other Ambulatory Visit: Payer: Self-pay | Admitting: Oncology

## 2018-09-27 VITALS — BP 108/70 | HR 72 | Resp 16 | Ht 66.5 in | Wt 241.8 lb

## 2018-09-27 DIAGNOSIS — D219 Benign neoplasm of connective and other soft tissue, unspecified: Secondary | ICD-10-CM

## 2018-09-27 DIAGNOSIS — N921 Excessive and frequent menstruation with irregular cycle: Secondary | ICD-10-CM | POA: Diagnosis not present

## 2018-09-27 NOTE — Progress Notes (Signed)
GYNECOLOGY  VISIT  CC:   Discuss possible hysterectomy instead of contraceptive surgery  HPI: 47 y.o. [id number] Divorced Black or [place] American female here for hysterectomy consult.  Pt was referred from Dr. Jana Hakim regarding possible tubal ligation.  She has a hx of breast cancer diagnosed 4/16.  She was treated with lumpectomy and SNB as well as chemotherapy and radiation.  She is now on Tamoxifen.  Genetic testing was negative although she does have a strong family hx of breast cancer.    Pt does not cycle regularly since chemotherapy but reports cycles seem to be longer, heavier and worse with each cycle.  She had planned to have bilateral salpingectomy performed but reports had another cycles 08/25/18.  It was terrible with clotting that lasted four days.  She had to change products every 1 hour or would leak through.  Used pads because tampons don't typically help.  She was traveling when this occurred and was unable to change products within an hour at one point during this trip.  She soaked through her clothes and had to change and discard clothes in a parking deck because there was so much bleeding onto her clothes.  She really would like to consider a hysterectomy.    Procedure discussed with patient.  Differences in hospital stay and recovery discussed with pt in comparison to tubal excision.  Surgical risks reviewed including but not limited to bleeding, 1% risk of receiving a  transfusion, infection, 3-4% risk of bowel/bladder/ureteral/vascular injury discussed as well as possible need for additional surgery if injury does occur discussed.  DVT/PE and rare risk of death discussed.  My actual complications with prior surgeries discussed.    Also, d/w pt ovary removal.  She immediately says yes this is something she desires.  D/w pt she will go into menopause fully after this and possible symptoms discussed.  She has experienced some of this intermittently in the past and feels she can handle  this.  Would like to Dr. Virgie Dad input so I will reach out to him.    GYNECOLOGIC HISTORY: Patient's last menstrual period was 08/31/2018. Contraception: none Menopausal hormone therapy: none  Patient Active Problem List   Diagnosis Date Noted  . Genetic testing 05/07/2015  . Malignant neoplasm of upper-outer quadrant of left breast in female, estrogen receptor positive ([place]) 04/29/2015  . Family history of breast cancer   . Family history of prostate cancer   . RLQ abdominal pain 09/23/2011    Past Medical History:  Diagnosis Date  . Allergy   . Anxiety   . Breast cancer ([place]) dx. 44 04/11/15   ILC of left breast  . Colon polyps   . Ectopic pregnancy   . Family history of breast cancer   . Family history of kidney cancer     Past Surgical History:  Procedure Laterality Date  . BREAST LUMPECTOMY     left august 2016  . BREAST LUMPECTOMY WITH RADIOACTIVE SEED AND SENTINEL LYMPH NODE BIOPSY Left 06/22/2015   Procedure: LEFT BREAST LUMPECTOMY WITH RADIOACTIVE SEED AND SENTINEL LYMPH NODE BIOPSY;  Surgeon: Coralie Keens, MD;  Location: [facility];  Service: General;  Laterality: Left;  . CERVICAL BIOPSY  W/ LOOP ELECTRODE EXCISION  1991  . CESAREAN SECTION    . CHOLECYSTECTOMY    . LYMPHADENECTOMY  1992  . NM RENAL LASIX ([facility])  2008  . SALPINGECTOMY Left 10/2005  . STRABISMUS SURGERY  2016    MEDS:   Current  Outpatient Medications on File Prior to Visit  Medication Sig Dispense Refill  . ALPRAZolam (XANAX) 0.5 MG tablet Take 1 tablet (0.5 mg total) by mouth at bedtime as needed for anxiety. Reported on 01/09/2016 10 tablet 0  . cetirizine (ZYRTEC) 10 MG tablet Take 10 mg by mouth daily.    Marland Kitchen ibuprofen (ADVIL,MOTRIN) 200 MG tablet Take 200 mg by mouth every 8 (eight) hours as needed.    . tamoxifen (NOLVADEX) 20 MG tablet TAKE 1 TABLET BY MOUTH EVERY DAY 90 tablet 3   No current facility-administered medications on file prior to visit.      ALLERGIES: Sulfa antibiotics  Family History  Problem Relation Age of Onset  . Breast cancer Mother 55  . Breast cancer Sister 24  . Breast cancer Maternal Aunt 60       +calcifications  . Prostate cancer Maternal Uncle 67  . Heart attack Maternal Grandmother   . Heart attack Maternal Grandfather   . Prostate cancer Maternal Grandfather 58  . Diabetes Paternal Grandmother   . Pancreatitis Paternal Grandfather   . Prostate cancer Cousin        dx. late 4s    SH:  Married, non smoker  Review of Systems  Genitourinary:       Loss of sexual interest   Psychiatric/Behavioral: The patient is nervous/anxious.        Excessive crying   All other systems reviewed and are negative.   PHYSICAL EXAMINATION:    BP 108/70 (BP Location: Right Arm, Patient Position: Sitting, Cuff Size: Large)   Pulse 72   Resp 16   Ht 5' 6.5" (1.689 m)   Wt 241 lb 12.8 oz (109.7 kg)   LMP 08/31/2018   BMI 38.44 kg/m     General appearance: alert, cooperative and appears stated age No physical exam performed today  Assessment: H/o Er/Pr + breast cancer 2016 treated with lumpectomy, SNB, chemo and radiation, now on tamoxifen Socially embarrassing menorrhagia H/O fibroids on prior imaging (which she states her previous provider never shared with her)  Plan: Will precert this for pt as she is interested in the difference in cost now that she is aware of difference in surgery and recovery. Will need pre-op PUS and endometrial biopsy if decides to proceed. Desires this be done in January.   ~25 minutes spent with patient >50% of time was in face to face discussion of above.  I did send staff message to Dr. Jana Hakim and he feels she is fairly close to menopause.  Called pt and relayed this to her as well as reminded her that menopause will completely fix all issues.  She is considering options.

## 2018-09-30 ENCOUNTER — Telehealth: Payer: Self-pay | Admitting: Obstetrics & Gynecology

## 2018-09-30 NOTE — Telephone Encounter (Signed)
Spoke with patient regarding benefits for surgery. Patient understood information presented. Patient is scheduled for an ultrasound appointment on 10/07/18 and will confirm surgery plans at that time. Patient is aware, if surgery is scheduled in January, we will reverify benefits for the new plan year. Will close encounter

## 2018-09-30 NOTE — Telephone Encounter (Signed)
Call placed to patient to review benefits for surgery. Left voicemail message requesting a return call    cc: Lamont Snowball, RN

## 2018-10-07 ENCOUNTER — Ambulatory Visit (INDEPENDENT_AMBULATORY_CARE_PROVIDER_SITE_OTHER): Payer: BLUE CROSS/BLUE SHIELD | Admitting: Obstetrics & Gynecology

## 2018-10-07 ENCOUNTER — Encounter: Payer: Self-pay | Admitting: Obstetrics & Gynecology

## 2018-10-07 ENCOUNTER — Ambulatory Visit (INDEPENDENT_AMBULATORY_CARE_PROVIDER_SITE_OTHER): Payer: BLUE CROSS/BLUE SHIELD

## 2018-10-07 VITALS — BP 130/74 | HR 68 | Resp 14 | Wt 237.4 lb

## 2018-10-07 DIAGNOSIS — N921 Excessive and frequent menstruation with irregular cycle: Secondary | ICD-10-CM

## 2018-10-07 DIAGNOSIS — D219 Benign neoplasm of connective and other soft tissue, unspecified: Secondary | ICD-10-CM | POA: Diagnosis not present

## 2018-10-07 NOTE — Progress Notes (Signed)
47 y.o. [id number] Divorced Black or [place] American female here for pelvic ultrasound due to menorrhagia and discussion of surgery.  She has debated proceeding with hysterectomy as she is unsure about having ovaries removed.  Options for treatment discussed with pt.  She does desire hysterectomy but just not ovary removal.  She wasn't sure this could be done.  Desires to proceed with scheduling in January..  No LMP recorded. (Menstrual status: Other).  Contraception: none  Findings:  UTERUS: 8.8 x 6.6 x 6.6cm with 1.4cm, 1.1cm and 1.3cm fibroids EMS: 57m ADNEXA: Left ovary: 2.4 x 2.5 x 1.6cm with 2.2cm x 0.7cm cystic structure c/w hydrosalpinx       Right ovary: 3.4 x 1.9 x 2.0cm CUL DE SAC: no free fluid  Discussion:  Findings reviewed with pt.  Due to tamoxifen use and thickened endometrium, recommended proceeding with biopsy today.  Possible hydrosalpinx on left noted.  Pt has hx of ectopic and possible salpingectomy on this side.  Does appear that portion of tube is still present.  Information about surgical options discussed and provided today.  Pt is leaning towards hysterectomy as this time.  Will consider and would like precert to be done.  Could not proceed until January due to holiday travel plans..  Assessment:  Menorrhagia with irregular cycles Uterine fibroids Thickened endometrium with current tamoxifen use  Plan:  Endometrial biopsy pending.  Surgical precert will be done.    ~15 minutes spent with patient >50% of time was in face to face discussion of above.

## 2018-10-10 ENCOUNTER — Encounter: Payer: Self-pay | Admitting: Obstetrics & Gynecology

## 2018-10-12 ENCOUNTER — Telehealth: Payer: Self-pay | Admitting: *Deleted

## 2018-10-12 NOTE — Telephone Encounter (Signed)
Call to patient. Voice mail has number confirmation left message to call to back.

## 2018-10-12 NOTE — Telephone Encounter (Signed)
Call from patient approximately 3pm. Advised of endometrial biopsy result as directed by Dr Sabra Heck.  Discussed surgical date options. Patient declines available December dates and prefers to wait until January. Interested in November 30, 2018.  Will wait to speak with business office regarding benefits for next year and then schedule.   Encounter closed.

## 2018-10-12 NOTE — Telephone Encounter (Signed)
-----   Message from Megan Salon, MD sent at 10/12/2018 10:05 AM EST ----- Please let pt know her endometrial biopsy was negative for abnormal cells.  She is considering hysterectomy for January.   Cc:  Lowell Bouton

## 2018-10-20 NOTE — Telephone Encounter (Signed)
Spoke with patient in regards to benefit estimates for 2020. Patient is agreeable to information presented. Patient is ready to proceed with scheduling and desires to schedule 11/30/2018, as previously discussed with Nurse Supervisor. Patient is aware of the surgery cancellation policy. Patient aware this is professional benefit only. Patient aware will be contacted by hospital for separate benefits. Forwarding to Conservation officer, historic buildings for scheduling.  Routing to Lamont Snowball, RN

## 2018-10-20 NOTE — Telephone Encounter (Signed)
Call placed to patient to review benefits for surgery in 2020. Left voicemail message requesting a return call.   cc: Lamont Snowball, RN

## 2018-10-28 NOTE — Telephone Encounter (Signed)
Call to patient to discuss  surgery date options. Per ROI can leave message on voice mail.  Left message calling to review surgery date of 11-29-18 ( change from 11-30-18.) Left message to call back to confirm.

## 2018-11-01 NOTE — Telephone Encounter (Signed)
Call to patient. States agreeable to surgery on 11-29-18. Declines to review instructions right now. Will call back.

## 2018-11-02 NOTE — Telephone Encounter (Signed)
Patient is returning a call to [place].

## 2018-11-03 NOTE — Telephone Encounter (Signed)
Call to patient. Surgery information form reviewed in detail with patient and she verbalized understanding. Aware [facility] will contact her separately for a pre op appointment at their facility. Patient scheduled for pre op with Dr. Sabra Heck on 11-18-18 at 1530, patient states she may still be traveling for the holidays, so may need to call and reschedule, but will call office to update. Post op appointment scheduled for 12-07-18 at 1530 and 01-11-19 at 1600. Patient agreeable to date and time of both appointments. Aware will receive a copy of Surgery information form at her pre op appointment.   Routing to provider and will close encounter.   Cc Lamont Snowball, RN

## 2018-11-07 ENCOUNTER — Other Ambulatory Visit: Payer: Self-pay | Admitting: Obstetrics & Gynecology

## 2018-11-08 ENCOUNTER — Telehealth: Payer: Self-pay | Admitting: Obstetrics & Gynecology

## 2018-11-08 NOTE — Telephone Encounter (Signed)
Patient called back. Had questions about taking real estate class on 12-04-18.  Advised this would be too soon after [place] on 11-29-18.  Patient voiced understanding. Encounter closed.

## 2018-11-08 NOTE — Telephone Encounter (Signed)
Return call to patient. Left message, returning her call. Can call back till 430. No details left.

## 2018-11-08 NOTE — Telephone Encounter (Signed)
Patient called requesting a call back from the nurse. She has questions about her healing time post-op. She said she has a continuing education course she needs to take and wants to be sure it won't interfere.

## 2018-11-18 ENCOUNTER — Ambulatory Visit (INDEPENDENT_AMBULATORY_CARE_PROVIDER_SITE_OTHER): Payer: BLUE CROSS/BLUE SHIELD | Admitting: Obstetrics & Gynecology

## 2018-11-18 ENCOUNTER — Encounter: Payer: Self-pay | Admitting: Obstetrics & Gynecology

## 2018-11-18 VITALS — BP 130/64 | HR 70 | Resp 16 | Ht 66.5 in | Wt 247.0 lb

## 2018-11-18 DIAGNOSIS — N921 Excessive and frequent menstruation with irregular cycle: Secondary | ICD-10-CM

## 2018-11-18 DIAGNOSIS — D219 Benign neoplasm of connective and other soft tissue, unspecified: Secondary | ICD-10-CM

## 2018-11-18 NOTE — Progress Notes (Signed)
GYNECOLOGY  VISIT  CC:   Pre-op consultation   HPI: 48 y.o. [id number] Divorced Black or [place] American female here for pre-op consultation.  TLH/bilateral salpingectomy/cystscopy planned due to significant and, at times, socially embarrassing menorrhagia.  She does have uterine fibroids which were noted on ultrasound on 10/07/18.  Uterus measured 9 x 6.6 x 6.6cm with three fibroids.  Ovaries were normal but a 2.2 cm hydrosalpinx on the left was noted.  She has a hx of ectopic pregnancy treated with left salpingectomy but I suspect there is a small amount of tubal tissue still present.  Endometrial biopsy was negative as well and this was obtained the same day.    She has decided to proceed with definitive treatment as she does not want to have any future bleeding.  She has a hx of breast cancer but has decided to leave ovaries in place.  She is not ready to deal with surgical menopause at this time.  Procedure discussed with patient.  Hospital stay, recovery and pain management all discussed.  Risks discussed including but not limited to bleeding, 1% risk of receiving a  transfusion, infection, 3-4% risk of bowel/bladder/ureteral/vascular injury discussed as well as possible need for additional surgery if injury does occur discussed.  DVT/PE and rare risk of death discussed.  My actual complications with prior surgeries discussed.  Vaginal cuff dehiscence discussed.  Hernia formation discussed.  Positioning and incision locations discussed.  Patient aware if pathology abnormal she may need additional treatment.  All questions answered.    GYNECOLOGIC HISTORY: Contraception: none  Patient Active Problem List   Diagnosis Date Noted  . Genetic testing 05/07/2015  . Malignant neoplasm of upper-outer quadrant of left breast in female, estrogen receptor positive ([place]) 04/29/2015  . Family history of breast cancer   . Family history of prostate cancer   . RLQ abdominal pain 09/23/2011    Past Medical  History:  Diagnosis Date  . Allergy   . Anxiety   . Breast cancer ([place]) dx. 44 04/11/15   ILC of left breast  . Colon polyps   . Ectopic pregnancy   . Family history of breast cancer     Past Surgical History:  Procedure Laterality Date  . BREAST LUMPECTOMY WITH RADIOACTIVE SEED AND SENTINEL LYMPH NODE BIOPSY Left 06/22/2015   Procedure: LEFT BREAST LUMPECTOMY WITH RADIOACTIVE SEED AND SENTINEL LYMPH NODE BIOPSY;  Surgeon: Coralie Keens, MD;  Location: [facility];  Service: General;  Laterality: Left;  . CERVICAL BIOPSY  W/ LOOP ELECTRODE EXCISION  1991  . CESAREAN SECTION    . CHOLECYSTECTOMY    . LYMPHADENECTOMY  1992   single node in right neck  . SALPINGECTOMY Left 10/2005  . STRABISMUS SURGERY  2016    MEDS:   Current Outpatient Medications on File Prior to Visit  Medication Sig Dispense Refill  . ALPRAZolam (XANAX) 0.5 MG tablet Take 1 tablet (0.5 mg total) by mouth at bedtime as needed for anxiety. Reported on 01/09/2016 10 tablet 0  . cetirizine (ZYRTEC) 10 MG tablet Take 10 mg by mouth daily.    Marland Kitchen ibuprofen (ADVIL,MOTRIN) 200 MG tablet Take 200 mg by mouth every 8 (eight) hours as needed.    . Maca Root 500 MG CAPS Take by mouth.    . tamoxifen (NOLVADEX) 20 MG tablet TAKE 1 TABLET BY MOUTH EVERY DAY 90 tablet 3   No current facility-administered medications on file prior to visit.     ALLERGIES: Sulfa antibiotics  Family History  Problem Relation Age of Onset  . Breast cancer Mother 37  . Breast cancer Sister 58  . Breast cancer Maternal Aunt 60       +calcifications  . Prostate cancer Maternal Uncle 67  . Heart attack Maternal Grandmother   . Heart attack Maternal Grandfather   . Prostate cancer Maternal Grandfather 52  . Diabetes Paternal Grandmother   . Pancreatitis Paternal Grandfather   . Prostate cancer Cousin        dx. late 45s    SH:  Married, non smoker  Review of Systems  Psychiatric/Behavioral:       Anxiety    PHYSICAL  EXAMINATION:    BP 130/64   Pulse 70   Resp 16   Ht 5' 6.5" (1.689 m)   Wt 247 lb (112 kg)   BMI 39.27 kg/m     General appearance: alert, cooperative and appears stated age Neck: no adenopathy, supple, symmetrical, trachea midline and thyroid normal to inspection and palpation CV:  Regular rate and rhythm Lungs:  clear to auscultation, no wheezes, rales or rhonchi, symmetric air entry Abdomen: soft, non-tender; bowel sounds normal; no masses,  no organomegaly Lymph:  no inguinal LAD noted  Pelvic: not repeated as was just done 10/07/18  Assessment: Menorrhagia Fibroid uterus Mild anemia H/O breast cancer with negative genetic testing, on Tamoxifen  Plan: Pt will stop Tamoxifen about a week before surgery to decreased DVT/PE risk.  Lovenox will be used.   Pre and post op do's and don'ts reviewed.  Questions answered.  Pt ready to proceed.

## 2018-11-19 ENCOUNTER — Encounter: Payer: Self-pay | Admitting: Obstetrics & Gynecology

## 2018-11-24 NOTE — Patient Instructions (Signed)
Your procedure is scheduled on  Monday 11/29/2018  Report to [facility] am.   Call this number if you have problems the morning of surgery  :[phone].   OUR ADDRESS IS [place].  [facility]ATED [facility].                                     REMEMBER:  DO NOT EAT FOOD  AFTER MIDNIGHT .    NO SOLID FOOD AFTER MIDNIGHT THE NIGHT PRIOR TO SURGERY. NOTHING BY MOUTH EXCEPT CLEAR LIQUIDS UNTIL 3 HOURS PRIOR TO [place] SURGERY. PLEASE FINISH ENSURE DRINK PER SURGEON ORDER 3 HOURS PRIOR TO SCHEDULED SURGERY TIME WHICH NEEDS TO BE COMPLETED AT  0430 am.   CLEAR LIQUID DIET   Foods Allowed                                                                     Foods Excluded  Coffee and tea, regular and decaf                             liquids that you cannot  Plain Jell-O in any flavor                                             see through such as: Fruit ices (not with fruit pulp)                                     milk, soups, orange juice  Iced Popsicles                                    All solid food Carbonated beverages, regular and diet                                    Cranberry, grape and apple juices Sports drinks like Gatorade Lightly seasoned clear broth or consume(fat free) Sugar, honey syrup  Sample Menu Breakfast                                Lunch                                     Supper Cranberry juice                    Beef broth                            Chicken broth Jell-O  Grape juice                           Apple juice Coffee or tea                        Jell-O                                      Popsicle                                                Coffee or tea                        Coffee or tea  _____________________________________________________________________   TAKE THESE MEDICATIONS MORNING OF SURGERY WITH A SIP OF WATER:  none  IF YOU ARE  SPENDING THE NIGHT AFTER SURGERY PLEASE BRING ALL YOUR PRESCRIPTION MEDICATIONS IN THEIR ORIGINAL BOTTLES.                                    DO NOT WEAR JEWERLY, MAKE UP, OR NAIL POLISH,  DO NOT WEAR LOTIONS, POWDERS, PERFUMES OR DEODORANT. DO NOT SHAVE FOR 24 HOURS PRIOR TO DAY OF SURGERY. . CONTACTS, GLASSES, OR DENTURES MAY NOT BE WORN TO SURGERY.                                    [facility] IS NOT RESPONSIBLE  FOR ANY BELONGINGS.                                                                    Marland Kitchen                                                                                                     - Preparing for Surgery Before surgery, you can play an important role.  Because skin is not sterile, your skin needs to be as free of germs as possible.  You can reduce the number of germs on your skin by washing with CHG (chlorahexidine gluconate) soap before surgery.  CHG is an antiseptic cleaner which kills germs and bonds with the skin to continue killing germs even after washing. Please DO NOT use if you have an allergy to CHG or antibacterial soaps.  If your skin becomes reddened/irritated stop using the CHG and inform your nurse when you arrive at Short Stay.  Do not shave (including legs and underarms) for at least 48 hours prior to the first CHG shower.  You may shave your face/neck. Please follow these instructions carefully:  1.  Shower with CHG Soap the night before surgery and the  morning of Surgery.  2.  If you choose to wash your hair, wash your hair first as usual with your  normal  shampoo.  3.  After you shampoo, rinse your hair and body thoroughly to remove the  shampoo.                           4.  Use CHG as you would any other liquid soap.  You can apply chg directly  to the skin and wash                       Gently with a scrungie or clean washcloth.  5.  Apply the CHG Soap to your body ONLY FROM THE NECK DOWN.   Do not use on face/ open                            Wound or open sores. Avoid contact with eyes, ears mouth and genitals (private parts).                       Wash face,  Genitals (private parts) with your normal soap.             6.  Wash thoroughly, paying special attention to the area where your surgery  will be performed.  7.  Thoroughly rinse your body with warm water from the neck down.  8.  DO NOT shower/wash with your normal soap after using and rinsing off  the CHG Soap.                9.  Pat yourself dry with a clean towel.            10.  Wear clean pajamas.            11.  Place clean sheets on your bed the night of your first shower and do not  sleep with pets. Day of Surgery : Do not apply any lotions/deodorants the morning of surgery.  Please wear clean clothes to the hospital/surgery center.  FAILURE TO FOLLOW THESE INSTRUCTIONS MAY RESULT IN THE CANCELLATION OF YOUR SURGERY PATIENT SIGNATURE_________________________________  NURSE SIGNATURE__________________________________  ________________________________________________________________________

## 2018-11-25 ENCOUNTER — Encounter (HOSPITAL_COMMUNITY): Payer: Self-pay

## 2018-11-25 ENCOUNTER — Encounter (HOSPITAL_COMMUNITY)
Admission: RE | Admit: 2018-11-25 | Discharge: 2018-11-25 | Disposition: A | Discharge status: Home / Self Care | Payer: BLUE CROSS/BLUE SHIELD | Source: Ambulatory Visit | Attending: Obstetrics & Gynecology | Admitting: Obstetrics & Gynecology

## 2018-11-25 ENCOUNTER — Other Ambulatory Visit: Payer: Self-pay

## 2018-11-25 DIAGNOSIS — Z01812 Encounter for preprocedural laboratory examination: Secondary | ICD-10-CM | POA: Insufficient documentation

## 2018-11-25 LAB — ABO/RH: ABO/RH(D): A POS

## 2018-11-25 LAB — BASIC METABOLIC PANEL
Anion gap: 7 (ref 5–15)
BUN: 16 mg/dL (ref 6–20)
CHLORIDE: 107 mmol/L (ref 98–111)
CO2: 24 mmol/L (ref 22–32)
Calcium: 8.8 mg/dL — ABNORMAL LOW (ref 8.9–10.3)
Creatinine, Ser: 1.11 mg/dL — ABNORMAL HIGH (ref 0.44–1.00)
GFR calc non Af Amer: 59 mL/min — ABNORMAL LOW (ref >60)
Glucose, Bld: 93 mg/dL (ref 70–99)
POTASSIUM: 4.5 mmol/L (ref 3.5–5.1)
SODIUM: 138 mmol/L (ref 135–145)

## 2018-11-25 LAB — CBC
HEMATOCRIT: 33.3 % — AB (ref 36.0–46.0)
HEMOGLOBIN: 10.6 g/dL — AB (ref 12.0–15.0)
MCH: 29.6 pg (ref 26.0–34.0)
MCHC: 31.8 g/dL (ref 30.0–36.0)
MCV: 93 fL (ref 80.0–100.0)
NRBC: 0 % (ref 0.0–0.2)
Platelets: 265 10*3/uL (ref 150–400)
RBC: 3.58 MIL/uL — ABNORMAL LOW (ref 3.87–5.11)
RDW: 13.6 % (ref 11.5–15.5)
WBC: 4.9 10*3/uL (ref 4.0–10.5)

## 2018-11-25 LAB — PREGNANCY, URINE: Preg Test, Ur: NEGATIVE

## 2018-11-27 NOTE — Anesthesia Preprocedure Evaluation (Signed)
Anesthesia Evaluation  Patient identified by MRN, date of birth, ID band Patient awake    Reviewed: Allergy & Precautions, H&P , NPO status , Patient's Chart, lab work & pertinent test results, reviewed documented beta blocker date and time   Airway Mallampati: I  TM Distance: >3 FB Neck ROM: Full    Dental no notable dental hx. (+) Teeth Intact, Dental Advisory Given   Pulmonary neg pulmonary ROS,    Pulmonary exam normal breath sounds clear to auscultation       Cardiovascular Exercise Tolerance: Good negative cardio ROS   Rhythm:Regular Rate:Normal     Neuro/Psych PSYCHIATRIC DISORDERS Anxiety negative neurological ROS     GI/Hepatic negative GI ROS, Neg liver ROS,   Endo/Other  Morbid obesity  Renal/GU negative Renal ROS  negative genitourinary   Musculoskeletal negative musculoskeletal ROS (+)   Abdominal   Peds  Hematology  (+) Blood dyscrasia, anemia ,   Anesthesia Other Findings   Reproductive/Obstetrics negative OB ROS                             Anesthesia Physical  Anesthesia Plan  ASA: II  Anesthesia Plan: General   Post-op Pain Management:    Induction: Intravenous  PONV Risk Score and Plan: 3 and Ondansetron, Treatment may vary due to age or medical condition, Dexamethasone and Midazolam  Airway Management Planned: Oral ETT  Additional Equipment:   Intra-op Plan:   Post-operative Plan: Extubation in OR  Informed Consent: I have reviewed the patients History and Physical, chart, labs and discussed the procedure including the risks, benefits and alternatives for the proposed anesthesia with the patient or authorized representative who has indicated his/her understanding and acceptance.   Dental advisory given  Plan Discussed with: CRNA, Anesthesiologist and Surgeon  Anesthesia Plan Comments: (  )        Anesthesia Quick Evaluation

## 2018-11-29 ENCOUNTER — Ambulatory Visit (HOSPITAL_BASED_OUTPATIENT_CLINIC_OR_DEPARTMENT_OTHER): Payer: BLUE CROSS/BLUE SHIELD | Admitting: Physician Assistant

## 2018-11-29 ENCOUNTER — Ambulatory Visit (HOSPITAL_BASED_OUTPATIENT_CLINIC_OR_DEPARTMENT_OTHER)
Admission: RE | Admit: 2018-11-29 | Discharge: 2018-11-30 | Disposition: A | Discharge status: Home / Self Care | Payer: BLUE CROSS/BLUE SHIELD | Attending: Obstetrics & Gynecology | Admitting: Obstetrics & Gynecology

## 2018-11-29 ENCOUNTER — Encounter (HOSPITAL_COMMUNITY)
Admission: RE | Disposition: A | Discharge status: Home / Self Care | Payer: Self-pay | Source: Home / Self Care | Attending: Obstetrics & Gynecology

## 2018-11-29 ENCOUNTER — Ambulatory Visit (HOSPITAL_BASED_OUTPATIENT_CLINIC_OR_DEPARTMENT_OTHER): Payer: BLUE CROSS/BLUE SHIELD | Admitting: Anesthesiology

## 2018-11-29 ENCOUNTER — Encounter (HOSPITAL_BASED_OUTPATIENT_CLINIC_OR_DEPARTMENT_OTHER): Payer: Self-pay

## 2018-11-29 DIAGNOSIS — N7011 Chronic salpingitis: Secondary | ICD-10-CM | POA: Insufficient documentation

## 2018-11-29 DIAGNOSIS — N920 Excessive and frequent menstruation with regular cycle: Secondary | ICD-10-CM

## 2018-11-29 DIAGNOSIS — N838 Other noninflammatory disorders of ovary, fallopian tube and broad ligament: Secondary | ICD-10-CM | POA: Diagnosis not present

## 2018-11-29 DIAGNOSIS — Z6839 Body mass index (BMI) 39.0-39.9, adult: Secondary | ICD-10-CM | POA: Insufficient documentation

## 2018-11-29 DIAGNOSIS — N736 Female pelvic peritoneal adhesions (postinfective): Secondary | ICD-10-CM | POA: Insufficient documentation

## 2018-11-29 DIAGNOSIS — D509 Iron deficiency anemia, unspecified: Secondary | ICD-10-CM

## 2018-11-29 DIAGNOSIS — Z803 Family history of malignant neoplasm of breast: Secondary | ICD-10-CM | POA: Insufficient documentation

## 2018-11-29 DIAGNOSIS — C50912 Malignant neoplasm of unspecified site of left female breast: Secondary | ICD-10-CM | POA: Diagnosis not present

## 2018-11-29 DIAGNOSIS — Z79810 Long term (current) use of selective estrogen receptor modulators (SERMs): Secondary | ICD-10-CM | POA: Insufficient documentation

## 2018-11-29 DIAGNOSIS — F419 Anxiety disorder, unspecified: Secondary | ICD-10-CM | POA: Insufficient documentation

## 2018-11-29 DIAGNOSIS — D259 Leiomyoma of uterus, unspecified: Secondary | ICD-10-CM | POA: Insufficient documentation

## 2018-11-29 DIAGNOSIS — D649 Anemia, unspecified: Secondary | ICD-10-CM

## 2018-11-29 HISTORY — PX: CYSTOSCOPY: SHX5120

## 2018-11-29 HISTORY — PX: TOTAL LAPAROSCOPIC HYSTERECTOMY WITH SALPINGECTOMY: SHX6742

## 2018-11-29 LAB — TYPE AND SCREEN
ABO/RH(D): A POS
Antibody Screen: NEGATIVE

## 2018-11-29 SURGERY — HYSTERECTOMY, TOTAL, LAPAROSCOPIC, WITH SALPINGECTOMY
Anesthesia: General | Site: Urethra

## 2018-11-29 MED ORDER — FENTANYL CITRATE (PF) 250 MCG/5ML IJ SOLN
INTRAMUSCULAR | Status: AC
  Filled 2018-11-29: qty 5

## 2018-11-29 MED ORDER — IBUPROFEN 400 MG PO TABS
800.0000 mg | ORAL_TABLET | Freq: Four times a day (QID) | ORAL | Status: DC
  Filled 2018-11-29: qty 1

## 2018-11-29 MED ORDER — ENOXAPARIN SODIUM 40 MG/0.4ML ~~LOC~~ SOLN
40.0000 mg | SUBCUTANEOUS | Status: DC
  Filled 2018-11-29: qty 0.4

## 2018-11-29 MED ORDER — ROCURONIUM BROMIDE 100 MG/10ML IV SOLN
INTRAVENOUS | Status: DC | PRN
  Administered 2018-11-29: 10 mg via INTRAVENOUS
  Administered 2018-11-29: 50 mg via INTRAVENOUS
  Administered 2018-11-29: 20 mg via INTRAVENOUS
  Administered 2018-11-29: 10 mg via INTRAVENOUS
  Administered 2018-11-29: 20 mg via INTRAVENOUS
  Administered 2018-11-29: 10 mg via INTRAVENOUS

## 2018-11-29 MED ORDER — MENTHOL 3 MG MT LOZG
1.0000 | LOZENGE | OROMUCOSAL | Status: DC | PRN
  Filled 2018-11-29: qty 9

## 2018-11-29 MED ORDER — FENTANYL CITRATE (PF) 100 MCG/2ML IJ SOLN
INTRAMUSCULAR | Status: AC
  Filled 2018-11-29: qty 2

## 2018-11-29 MED ORDER — SODIUM CHLORIDE (PF) 0.9 % IJ SOLN
INTRAMUSCULAR | Status: DC | PRN
  Administered 2018-11-29: 30 mL

## 2018-11-29 MED ORDER — LIDOCAINE HCL (CARDIAC) PF 100 MG/5ML IV SOSY
PREFILLED_SYRINGE | INTRAVENOUS | Status: DC | PRN
  Administered 2018-11-29: 100 mg via INTRAVENOUS

## 2018-11-29 MED ORDER — MEPERIDINE HCL 25 MG/ML IJ SOLN
6.2500 mg | INTRAMUSCULAR | Status: DC | PRN
  Filled 2018-11-29: qty 1

## 2018-11-29 MED ORDER — ACETAMINOPHEN 325 MG PO TABS
325.0000 mg | ORAL_TABLET | ORAL | Status: DC | PRN
  Filled 2018-11-29: qty 2

## 2018-11-29 MED ORDER — SODIUM CHLORIDE 0.9 % IV SOLN
INTRAVENOUS | Status: DC | PRN
  Administered 2018-11-29: 30 mL

## 2018-11-29 MED ORDER — SCOPOLAMINE 1 MG/3DAYS TD PT72
MEDICATED_PATCH | TRANSDERMAL | Status: DC | PRN
  Administered 2018-11-29: 1 via TRANSDERMAL

## 2018-11-29 MED ORDER — SODIUM CHLORIDE 0.9 % IV SOLN
INTRAVENOUS | Status: AC
  Filled 2018-11-29: qty 2

## 2018-11-29 MED ORDER — ROCURONIUM BROMIDE 10 MG/ML (PF) SYRINGE
PREFILLED_SYRINGE | INTRAVENOUS | Status: AC
  Filled 2018-11-29: qty 10

## 2018-11-29 MED ORDER — ACETAMINOPHEN 160 MG/5ML PO SOLN
325.0000 mg | ORAL | Status: DC | PRN
  Filled 2018-11-29: qty 20.3

## 2018-11-29 MED ORDER — ONDANSETRON HCL 4 MG/2ML IJ SOLN
INTRAMUSCULAR | Status: AC
  Filled 2018-11-29: qty 2

## 2018-11-29 MED ORDER — PROPOFOL 10 MG/ML IV BOLUS
INTRAVENOUS | Status: AC
  Filled 2018-11-29: qty 40

## 2018-11-29 MED ORDER — METOCLOPRAMIDE HCL 5 MG/ML IJ SOLN
INTRAMUSCULAR | Status: DC | PRN
  Administered 2018-11-29: 10 mg via INTRAVENOUS

## 2018-11-29 MED ORDER — OXYCODONE HCL 5 MG/5ML PO SOLN
5.0000 mg | Freq: Once | ORAL | Status: DC | PRN
  Filled 2018-11-29: qty 5

## 2018-11-29 MED ORDER — OXYCODONE HCL 5 MG PO TABS
5.0000 mg | ORAL_TABLET | Freq: Once | ORAL | Status: DC | PRN
  Filled 2018-11-29: qty 1

## 2018-11-29 MED ORDER — SIMETHICONE 80 MG PO CHEW
80.0000 mg | CHEWABLE_TABLET | Freq: Four times a day (QID) | ORAL | Status: DC | PRN
  Administered 2018-11-29: 80 mg via ORAL
  Filled 2018-11-29 (×2): qty 1

## 2018-11-29 MED ORDER — FENTANYL CITRATE (PF) 100 MCG/2ML IJ SOLN
25.0000 ug | INTRAMUSCULAR | Status: DC | PRN
  Administered 2018-11-29 (×2): 50 ug via INTRAVENOUS
  Filled 2018-11-29: qty 1

## 2018-11-29 MED ORDER — SUGAMMADEX SODIUM 200 MG/2ML IV SOLN
INTRAVENOUS | Status: AC
  Filled 2018-11-29: qty 2

## 2018-11-29 MED ORDER — MIDAZOLAM HCL 2 MG/2ML IJ SOLN
INTRAMUSCULAR | Status: AC
  Filled 2018-11-29: qty 2

## 2018-11-29 MED ORDER — DEXAMETHASONE SODIUM PHOSPHATE 10 MG/ML IJ SOLN
INTRAMUSCULAR | Status: AC
  Filled 2018-11-29: qty 1

## 2018-11-29 MED ORDER — SUGAMMADEX SODIUM 200 MG/2ML IV SOLN
INTRAVENOUS | Status: DC | PRN
  Administered 2018-11-29: 200 mg via INTRAVENOUS

## 2018-11-29 MED ORDER — PANTOPRAZOLE SODIUM 40 MG IV SOLR
40.0000 mg | Freq: Every day | INTRAVENOUS | Status: DC
  Administered 2018-11-29: 40 mg via INTRAVENOUS
  Filled 2018-11-29 (×2): qty 40

## 2018-11-29 MED ORDER — SODIUM CHLORIDE 0.9 % IV SOLN
2.0000 g | Freq: Two times a day (BID) | INTRAVENOUS | Status: AC
  Administered 2018-11-29: 2 g via INTRAVENOUS
  Filled 2018-11-29 (×2): qty 2

## 2018-11-29 MED ORDER — KETOROLAC TROMETHAMINE 30 MG/ML IJ SOLN
30.0000 mg | Freq: Four times a day (QID) | INTRAMUSCULAR | Status: DC
  Administered 2018-11-29 – 2018-11-30 (×3): 30 mg via INTRAVENOUS
  Filled 2018-11-29 (×4): qty 1

## 2018-11-29 MED ORDER — DEXTROSE 10 % IV SOLN
INTRAVENOUS | Status: AC | PRN
  Administered 2018-11-29: 800 mL via INTRAVENOUS

## 2018-11-29 MED ORDER — ONDANSETRON HCL 4 MG/2ML IJ SOLN
4.0000 mg | Freq: Four times a day (QID) | INTRAMUSCULAR | Status: DC | PRN
  Filled 2018-11-29: qty 2

## 2018-11-29 MED ORDER — ENOXAPARIN SODIUM 40 MG/0.4ML ~~LOC~~ SOLN
40.0000 mg | SUBCUTANEOUS | Status: AC
  Administered 2018-11-29: 40 mg via SUBCUTANEOUS
  Filled 2018-11-29: qty 0.4

## 2018-11-29 MED ORDER — FENTANYL CITRATE (PF) 100 MCG/2ML IJ SOLN
INTRAMUSCULAR | Status: DC | PRN
  Administered 2018-11-29: 25 ug via INTRAVENOUS
  Administered 2018-11-29 (×3): 50 ug via INTRAVENOUS
  Administered 2018-11-29: 100 ug via INTRAVENOUS
  Administered 2018-11-29: 25 ug via INTRAVENOUS
  Administered 2018-11-29 (×3): 50 ug via INTRAVENOUS
  Administered 2018-11-29: 25 ug via INTRAVENOUS
  Administered 2018-11-29: 50 ug via INTRAVENOUS
  Administered 2018-11-29: 25 ug via INTRAVENOUS

## 2018-11-29 MED ORDER — SODIUM CHLORIDE 0.9 % IV SOLN
2.0000 g | INTRAVENOUS | Status: AC
  Administered 2018-11-29: 2 g via INTRAVENOUS
  Filled 2018-11-29: qty 2

## 2018-11-29 MED ORDER — ONDANSETRON HCL 4 MG PO TABS
4.0000 mg | ORAL_TABLET | Freq: Four times a day (QID) | ORAL | Status: DC | PRN
  Filled 2018-11-29: qty 1

## 2018-11-29 MED ORDER — LACTATED RINGERS IV SOLN
INTRAVENOUS | Status: DC
  Administered 2018-11-29 (×3): via INTRAVENOUS
  Filled 2018-11-29: qty 1000

## 2018-11-29 MED ORDER — VITAMINS A & D EX OINT
TOPICAL_OINTMENT | CUTANEOUS | Status: AC
  Filled 2018-11-29: qty 5

## 2018-11-29 MED ORDER — ONDANSETRON HCL 4 MG/2ML IJ SOLN
4.0000 mg | Freq: Once | INTRAMUSCULAR | Status: DC | PRN
  Filled 2018-11-29: qty 2

## 2018-11-29 MED ORDER — LIDOCAINE 2% (20 MG/ML) 5 ML SYRINGE
INTRAMUSCULAR | Status: AC
  Filled 2018-11-29: qty 5

## 2018-11-29 MED ORDER — ALUM & MAG HYDROXIDE-SIMETH 200-200-20 MG/5ML PO SUSP
30.0000 mL | ORAL | Status: DC | PRN
  Filled 2018-11-29: qty 30

## 2018-11-29 MED ORDER — MIDAZOLAM HCL 5 MG/5ML IJ SOLN
INTRAMUSCULAR | Status: DC | PRN
  Administered 2018-11-29: 2 mg via INTRAVENOUS

## 2018-11-29 MED ORDER — MORPHINE SULFATE (PF) 2 MG/ML IV SOLN
1.0000 mg | INTRAVENOUS | Status: DC | PRN
  Administered 2018-11-29: 2 mg via INTRAVENOUS
  Filled 2018-11-29 (×2): qty 1

## 2018-11-29 MED ORDER — SODIUM CHLORIDE 0.9 % IV SOLN
INTRAVENOUS | Status: AC | PRN
  Administered 2018-11-29: 1000 mL

## 2018-11-29 MED ORDER — OXYCODONE-ACETAMINOPHEN 5-325 MG PO TABS
1.0000 | ORAL_TABLET | ORAL | Status: DC | PRN
  Administered 2018-11-29 (×2): 1 via ORAL
  Administered 2018-11-29: 2 via ORAL
  Administered 2018-11-30 (×3): 1 via ORAL
  Filled 2018-11-29: qty 2
  Filled 2018-11-29 (×2): qty 1
  Filled 2018-11-29: qty 2
  Filled 2018-11-29 (×3): qty 1

## 2018-11-29 MED ORDER — BUPIVACAINE HCL (PF) 0.25 % IJ SOLN
INTRAMUSCULAR | Status: DC | PRN
  Administered 2018-11-29: 16 mL

## 2018-11-29 MED ORDER — KETOROLAC TROMETHAMINE 30 MG/ML IJ SOLN
INTRAMUSCULAR | Status: DC | PRN
  Administered 2018-11-29: 30 mg via INTRAVENOUS

## 2018-11-29 MED ORDER — ENOXAPARIN SODIUM 40 MG/0.4ML ~~LOC~~ SOLN
SUBCUTANEOUS | Status: AC
  Filled 2018-11-29: qty 0.4

## 2018-11-29 MED ORDER — DEXAMETHASONE SODIUM PHOSPHATE 4 MG/ML IJ SOLN
INTRAMUSCULAR | Status: DC | PRN
  Administered 2018-11-29: 10 mg via INTRAVENOUS

## 2018-11-29 MED ORDER — ONDANSETRON HCL 4 MG/2ML IJ SOLN
INTRAMUSCULAR | Status: DC | PRN
  Administered 2018-11-29: 4 mg via INTRAVENOUS

## 2018-11-29 MED ORDER — PROPOFOL 10 MG/ML IV BOLUS
INTRAVENOUS | Status: DC | PRN
  Administered 2018-11-29: 40 mg via INTRAVENOUS
  Administered 2018-11-29: 160 mg via INTRAVENOUS

## 2018-11-29 MED ORDER — METOCLOPRAMIDE HCL 5 MG/ML IJ SOLN
INTRAMUSCULAR | Status: AC
  Filled 2018-11-29: qty 2

## 2018-11-29 MED ORDER — DEXTROSE-NACL 5-0.45 % IV SOLN
INTRAVENOUS | Status: DC
  Administered 2018-11-29 (×2): via INTRAVENOUS
  Filled 2018-11-29: qty 1000

## 2018-11-29 SURGICAL SUPPLY — 67 items
APPLICATOR ARISTA FLEXITIP XL (MISCELLANEOUS) IMPLANT
APPLICATOR COTTON TIP 6 STRL (MISCELLANEOUS) ×2 IMPLANT
APPLICATOR COTTON TIP 6IN STRL (MISCELLANEOUS) ×3
APPLICATOR SURGIFLO ENDO (HEMOSTASIS) ×3 IMPLANT
APPLIER CLIP 5 13 M/L LIGAMAX5 (MISCELLANEOUS) ×3
BLADE SURG 10 STRL SS (BLADE) ×6 IMPLANT
CABLE HIGH FREQUENCY MONO STRZ (ELECTRODE) IMPLANT
CLIP APPLIE 5 13 M/L LIGAMAX5 (MISCELLANEOUS) ×2 IMPLANT
COVER BACK TABLE 80X110 HD (DRAPES) ×3 IMPLANT
COVER MAYO STAND STRL (DRAPES) ×3 IMPLANT
COVER SURGICAL LIGHT HANDLE (MISCELLANEOUS) ×3 IMPLANT
COVER WAND RF STERILE (DRAPES) ×3 IMPLANT
DERMABOND ADVANCED (GAUZE/BANDAGES/DRESSINGS) ×1
DERMABOND ADVANCED .7 DNX12 (GAUZE/BANDAGES/DRESSINGS) ×2 IMPLANT
DILATOR CANAL MILEX (MISCELLANEOUS) IMPLANT
DISSECTOR BLUNT TIP ENDO 5MM (MISCELLANEOUS) ×3 IMPLANT
DRSG COVADERM PLUS 2X2 (GAUZE/BANDAGES/DRESSINGS) IMPLANT
DRSG OPSITE POSTOP 3X4 (GAUZE/BANDAGES/DRESSINGS) IMPLANT
DURAPREP 26ML APPLICATOR (WOUND CARE) ×3 IMPLANT
GAUZE 4X4 16PLY RFD (DISPOSABLE) ×6 IMPLANT
GLOVE BIO SURGEON STRL SZ7.5 (GLOVE) ×9 IMPLANT
GLOVE BIOGEL PI IND STRL 7.0 (GLOVE) ×12 IMPLANT
GLOVE BIOGEL PI INDICATOR 7.0 (GLOVE) ×6
GLOVE ECLIPSE 6.5 STRL STRAW (GLOVE) ×12 IMPLANT
GOWN STRL REUS W/TWL LRG LVL3 (GOWN DISPOSABLE) ×18 IMPLANT
GOWN STRL REUS W/TWL XL LVL3 (GOWN DISPOSABLE) ×9 IMPLANT
HEMOSTAT ARISTA ABSORB 3G PWDR (MISCELLANEOUS) IMPLANT
LIGASURE VESSEL 5MM BLUNT TIP (ELECTROSURGICAL) ×3 IMPLANT
NEEDLE INSUFFLATION 120MM (ENDOMECHANICALS) ×3 IMPLANT
NS IRRIG 1000ML POUR BTL (IV SOLUTION) ×3 IMPLANT
OCCLUDER COLPOPNEUMO (BALLOONS) ×3 IMPLANT
PACK LAPAROSCOPY BASIN (CUSTOM PROCEDURE TRAY) ×3 IMPLANT
PACK TRENDGUARD 450 HYBRID PRO (MISCELLANEOUS) ×2 IMPLANT
POUCH LAPAROSCOPIC INSTRUMENT (MISCELLANEOUS) ×3 IMPLANT
PROTECTOR NERVE ULNAR (MISCELLANEOUS) ×6 IMPLANT
SCISSORS LAP 5X35 DISP (ENDOMECHANICALS) IMPLANT
SET IRRIG TUBING LAPAROSCOPIC (IRRIGATION / IRRIGATOR) ×3 IMPLANT
SET IRRIG Y TYPE TUR BLADDER L (SET/KITS/TRAYS/PACK) ×3 IMPLANT
SET TRI-LUMEN FLTR TB AIRSEAL (TUBING) ×3 IMPLANT
SHEARS HARMONIC ACE PLUS 36CM (ENDOMECHANICALS) ×3 IMPLANT
SLEEVE SURGEON STRL (DRAPES) ×3 IMPLANT
SOLUTION ELECTROLUBE (MISCELLANEOUS) IMPLANT
SURGIFLO W/THROMBIN 8M KIT (HEMOSTASIS) ×3 IMPLANT
SUT VIC AB 0 CT1 27 (SUTURE) ×2
SUT VIC AB 0 CT1 27XBRD ANBCTR (SUTURE) ×4 IMPLANT
SUT VIC AB 4-0 PS2 18 (SUTURE) ×3 IMPLANT
SUT VICRYL 0 UR6 27IN ABS (SUTURE) IMPLANT
SUT VICRYL 4-0 PS2 18IN ABS (SUTURE) ×3 IMPLANT
SUT VLOC 180 0 9IN  GS21 (SUTURE) ×1
SUT VLOC 180 0 9IN GS21 (SUTURE) ×2 IMPLANT
SYR 10ML LL (SYRINGE) ×3 IMPLANT
SYR 50ML LL SCALE MARK (SYRINGE) ×6 IMPLANT
SYSTEM CARTER THOMASON II (TROCAR) IMPLANT
TIP UTERINE 5.1X6CM LAV DISP (MISCELLANEOUS) IMPLANT
TIP UTERINE 6.7X10CM GRN DISP (MISCELLANEOUS) IMPLANT
TIP UTERINE 6.7X6CM WHT DISP (MISCELLANEOUS) IMPLANT
TIP UTERINE 6.7X8CM BLUE DISP (MISCELLANEOUS) ×3 IMPLANT
TOWEL OR 17X24 6PK STRL BLUE (TOWEL DISPOSABLE) ×6 IMPLANT
TRAY FOLEY W/BAG SLVR 14FR (SET/KITS/TRAYS/PACK) ×3 IMPLANT
TRENDGUARD 450 HYBRID PRO PACK (MISCELLANEOUS) ×3
TROCAR 5M 150ML BLDLS (TROCAR) ×3 IMPLANT
TROCAR ADV FIXATION 5X100MM (TROCAR) ×3 IMPLANT
TROCAR PORT AIRSEAL 5X120 (TROCAR) ×3 IMPLANT
TROCAR XCEL NON BLADE 8MM B8LT (ENDOMECHANICALS) ×3 IMPLANT
TROCAR XCEL NON-BLD 5MMX100MML (ENDOMECHANICALS) ×3 IMPLANT
TUBE CONNECTING 12X1/4 (SUCTIONS) ×3 IMPLANT
WARMER LAPAROSCOPE (MISCELLANEOUS) ×3 IMPLANT

## 2018-11-29 NOTE — Transfer of Care (Signed)
Immediate Anesthesia Transfer of Care Note  Patient: Rhonda Moss  Procedure(s) Performed: Procedure(s) (LRB): TOTAL LAPAROSCOPIC HYSTERECTOMY WITH SALPINGECTOMY (Bilateral) CYSTOSCOPY (N/A)  Patient Location: PACU  Anesthesia Type: General  Level of Consciousness: awake, sedated, patient cooperative and responds to stimulation  Airway & Oxygen Therapy: Patient Spontanous Breathing and Patient connected to face mask oxygen  Post-op Assessment: Report given to PACU RN, Post -op Vital signs reviewed and stable and Patient moving all extremities  Post vital signs: Reviewed and stable  Complications: No apparent anesthesia complications

## 2018-11-29 NOTE — Anesthesia Procedure Notes (Signed)
Procedure Name: Intubation Date/Time: 11/29/2018 7:39 AM Performed by: Justice Rocher, CRNA Pre-anesthesia Checklist: Patient identified, Emergency Drugs available, Suction available and Patient being monitored Patient Re-evaluated:Patient Re-evaluated prior to induction Oxygen Delivery Method: Circle system utilized Preoxygenation: Pre-oxygenation with 100% oxygen Induction Type: IV induction Ventilation: Mask ventilation without difficulty Laryngoscope Size: Mac and 4 Grade View: Grade II Tube type: Oral Tube size: 7.5 mm Number of attempts: 1 Airway Equipment and Method: Stylet and Oral airway Placement Confirmation: ETT inserted through vocal cords under direct vision,  positive ETCO2 and breath sounds checked- equal and bilateral Secured at: 23 cm Tube secured with: Tape Dental Injury: Teeth and Oropharynx as per pre-operative assessment

## 2018-11-29 NOTE — Op Note (Signed)
11/29/2018  1:46 PM  PATIENT:  Rhonda Moss  48 y.o. female  PRE-OPERATIVE DIAGNOSIS:  Fibroids, menorrhagia, thickened endometrium, personal and family history breast cancer  POST-OPERATIVE DIAGNOSIS:  Fibroids, menorrhagia, thickened endometrium, personal and family history breast cancer, significant pelvic adhesions, possible endometriosis, dilated left fallopian tube  PROCEDURE:  Procedure(s): TOTAL LAPAROSCOPIC HYSTERECTOMY WITH SALPINGECTOMY CYSTOSCOPY  SURGEON:  Megan Salon  ASSISTANTS: Josefa Half, MD.  MD assistant was necessary for tissue manipulation, retraction and positioning due to the complexity of the case and hospital policies.   ANESTHESIA:   general  ESTIMATED BLOOD LOSS: 250 mL  BLOOD ADMINISTERED:none   FLUIDS: 2000cc  UOP: 500cc clear UOP  SPECIMEN:  Uterus, cervix, fallopian tubes, and left ovary  DISPOSITION OF SPECIMEN:  PATHOLOGY  FINDINGS: enlarged uterus c/w fibroid and adenomyosis, significant pelvic adhesions on both left and right side of uterus, left hydrosalpinx, right tubal scarring with only portion of tube present  DESCRIPTION OF OPERATION: Patient is taken to the operating room. She is placed in the supine position. She is a running IV in place. Informed consent was present on the chart. SCDs on her lower extremities and functioning properly. Patient was positioned while she was awake.  Her legs were placed in the low lithotomy position in [place]. Her arms were tucked by the side.  General endotracheal anesthesia was administered by the anesthesia staff without difficulty. Dr. Rayetta Pigg, anesthesia, oversaw case.  Time out performed.    Chlora prep was then used to prep the abdomen and Hibiclens was used to prep the inner thighs, perineum and vagina. Once 3 minutes had past the patient was draped in a normal standard fashion. The legs were lifted to the high lithotomy position. The cervix was visualized by placing a heavy weighted  speculum in the posterior aspect of the vagina and using a curved Deaver retractor to the retract anteriorly. The anterior lip of the cervix was grasped with single-tooth tenaculum.  The uterus sounded to 8 cm. Pratt dilators were used to dilate the cervix up to a #21. A RUMI uterine manipulator was obtained. A #8 disposable tip was placed on the RUMI manipulator as well as a 3.0, silver KOH ring. This was passed through the cervix and the bulb of the disposable tip was inflated with 10 cc of normal saline. There was a good fit of the KOH ring around the cervix. The tenaculum was removed. There is also good manipulation of the uterus. The speculum and retractor were removed as well. A Foley catheter was placed to straight drain.  Clear urine was noted. Legs were lowered to the low lithotomy position and attention was turned the abdomen.  Due to prior surgeries, the first port was placed in the LUQ.  Skin was anesthetized with 0.25% Marcaine.  46m skin incision was made.  5241mnonbladed Optivew trochar, port, and laparoscope was placed under direct visualization.  Once intraperitoneal placement was confirmed, pneumoperitoneum was achieved with low flow of CO2 gas.  Then high flow adjusted.  There were adhesions to the liver and beneath the umbilicus.  There was no bowel in these adhesions.  LLQ port was placed.  Skin was anesthetized with 0.25% Marcaine.  41m59mkin incision was placed and then a 41mm74mn bladed trochar and port was placed.  These adhesions were taken down with the Harmonic scalpel.   Then a 41mm 59mision was made in the umbilicus and another 41mm p48m was placed in this location.  Finally, locations  for the RLQ port and suprapubic port were noted by transillumination of the abdominal wall.  0.25% marcaine was used to anesthetize the skin.  58m skin incision was made in the RLQ and suprpubically.  An AirSeal port was placed underdirect visualization of the laparoscope in the RLQ and an 859mnon-bladed  port was placed suprapubic with direct visualization of the laparoscope.  All trochars were removed.    Enlarged fibroid uterus with hydrosalpinx and significant adhesions on the left was noted.  Also, adhesions and an atypical appearing right fallopian tube were noted on the right.  Attention was turned to the left side. With uterus on stretch the left tube was excised off the ovary and mesosalpinx was dissected to free the tube. Then the left utero-ovarian pedicle was serially clamped cauterized and incised using the ligasure device. Left round ligament was serially clamped cauterized and incised. The anterior and posterior peritoneum of the inferior leaf of the broad ligament were opened. The beginning of the baldde flap was created.  The bladder was taken down below the level of the KOH ring. The left uterine artery skeletonized and then just superior to the KOH ring this vessel was serially clamped, cauterized, and incised.  There were adhesions of the bowel epiploica on the fundus of the uterus and towards to right fallopian tube.  Using careful dissection with the Harmonic scalpel and staying away from the bowel, these adhesions were taken down without difficulty.  Attention was turned the right side with the uterus on stretch to the left.  The ureter was identified.  The ovary and fallopian tube were adhered to the left sidewall and down into the pelvis.  The IP ligament was isolated and clamped, cauterized and incised.  Ligasure was used.  The left round ligament was serially clamped, cauterized and incised.  The ovary and fallopian tube (that was dilated) was carefully dissected from the left sidewall using sharp and blunt dissection as needed.  Location of ureter was noted multiple times during this dissection.  The anterior peritoneum was incised.  There was scar present from the prior cesarean section.  The bladder was carefully taken down below the level of the KOH ring.  The posterior peritoneum  was then taken down.  It was thickened and mildly edematous.  The colon was well out of the way with this dissection.  Then the uterine artery on the left side was skeletonized.  Then it was clamped and cauterized several times before cutting just at the edge of the KOH ring.    Attention was then turned to the right side.  The ureter was identified.  The left tube appears to only have the proximal half present.  This was dissected free from the ovary using the harmonic scalpel.  Then the uteroovarian pedicle was serially clamped, cauterized and incised.  The anterior peritoneum was opened and carried across to the left side.  The remainder of the bladder flap was taken down below the level of the KOH ring.  The posterior peritoneum was very thickened and abnormal in appearance.  This dissection was difficult although location of the ureter was noted throughout this dissection.  A portion of the uterine artery on the side of the uterus was incised and there was some bleeding.  Attempt at controlling this was made with a surgical clip.  This was unsuccessful.  This side of the uterus was ultimately just grasped with a Million dollar grasper and held.  This controlled bleeding and allowed  for skeletonization of the right uterine artery.  This was then clamped, cauterized and incised.  Finally, the uterus was devascularized. There was minimal bleeding at this point.    The colpotomy was performed a starting in the midline and using a harmonic scalpel with the inferior edge of the open blade  This was carried around a circumferential fashion until the vaginal mucosa was completely incised in the specimen was freed.  The specimen was then delivered to the vagina.  It would not completely deliver into the vaginal due to the bulky nature of the uterus.  Attention was then turned to the vagina.  The legs were lifted to the high lithotomy position.  The uterus was morcellated in the vagina and finally delivered. The  surgical clip was present on the specimen.  Then a vaginal occlusive device was placed in the vagina to maintain the pneumoperitoneum.    Sterile gown and gloves were changed and attention was turned back to the abdomen.  Instruments were changed with a needle driver and Kobra graspers.  Using a 9 inch V. lock suture, the cuff was closed by incorporating the anterior and posterior vaginal mucosa in each stitch. This was carried across all the way to the left corner and a running fashion. Two stitches were brought back towards the midline and the suture was cut flush with the vagina. The needle was brought out the pelvis. The pelvis was irrigated. All pedicles were inspected. No bleeding was noted.    Pt was taken out of Trendelenburg positioning to make sure any blood in the gutters was removed.  There was a small amount of blood present that was suctioned.  Pelvis was suctions again and irrigated.  Then pressured were lowered on AirSeal device to 68m Hg.  All pedicles were inspected.  No bleeding was noted.  Surgiflow was placed along the cuff and at all pedicles.  At this point the procedure was completed.  The suprapubic port and LUQ port were removed.   remaining instruments were removed.  Pneumoperitoneum was relieved.  Several deep breaths were given to the patient's trying to any gas the abdomen and finally the midline port, RLQ and LLQ ports were removed.  The skin was then closed with subcuticular stitches of 3-0 Vicryl. The skin was cleansed Dermabond was applied. Attention was then turned the vagina and the cuff was inspected. No bleeding was noted.  Cuff was intact and the anterior and posterior vaginal mucosa were incorporated in each stitch. The Foley catheter was removed.  Cystoscopy was performed.  No sutures or bladder injuries were noted.  Ureters were noted with normal urine jets from each one was seen.  Foley was left out after the cystoscopic fluid was drained and cystoscope removed.   Sponge, lap, needle, initially counts were correct x2. Patient tolerated the procedure very well. She was awakened from anesthesia, extubated and taken to recovery in stable condition.   Total surgical time was 5 hours due to adhesions at the umbilicus and on the uterus as well as the adherent ovary and left fallopian tube to the left sidewall.  Lastly morcellation increased the surgical time as well.  COUNTS:  YES  PLAN OF CARE: Transfer to PACU

## 2018-11-29 NOTE — H&P (Signed)
Rhonda Moss is an 48 y.o. female  [phone] DAA female here for TLH/bilateral salpingectomy/cystscopy and possibel BSO due to socially embarrassing menorrhagia.  Her uterus measuring 9 x 6.6 x 6.6cm with three fibroids, the largest measuring 1.4cm.  She has a hx of an ectopic pregnancy with laparoscopic salpingectomy on the left however there does appear to be a small portion of tube with fluid in it.  Pt did undergo an endometrial biopsy on  10/07/18 that was negative.  She does have a hx of breast cancer in 2016.  She does not want her ovaries removed unless there is something abnormal appearing about them.  We have diascussed this at length.  She declines any hormonal method and was not interested in an IUD or endometrial ablation.  Therefore, this was the only option remaining.  Risks and benefits have been reviewed.  She is here and ready to proceed.    Pertinent Gynecological History: Menses: menorrhagia Contraception: none but pregnancy test is negative DES exposure: denies Blood transfusions: none Sexually transmitted diseases: no past history Previous GYN Procedures: LEEP 1991, normal pap smears since  Last mammogram: normal Date: 6/19 Last pap: normal Date: 08/17/18 OB History: G6, P2   Menstrual History: No LMP recorded. (Menstrual status: Other).    Past Medical History:  Diagnosis Date  . Allergy   . Anxiety   . Breast cancer ([place]) dx. 44 04/11/15   ILC of left breast  . Colon polyps   . Ectopic pregnancy   . Family history of breast cancer     Past Surgical History:  Procedure Laterality Date  . BREAST LUMPECTOMY WITH RADIOACTIVE SEED AND SENTINEL LYMPH NODE BIOPSY Left 06/22/2015   Procedure: LEFT BREAST LUMPECTOMY WITH RADIOACTIVE SEED AND SENTINEL LYMPH NODE BIOPSY;  Surgeon: Coralie Keens, MD;  Location: [facility];  Service: General;  Laterality: Left;  . CERVICAL BIOPSY  W/ LOOP ELECTRODE EXCISION  1991  . CESAREAN SECTION  2002  . CHOLECYSTECTOMY   1998  . LYMPHADENECTOMY  1992   single node in right neck  . SALPINGECTOMY Left 10/2005  . STRABISMUS SURGERY  2016    Family History  Problem Relation Age of Onset  . Breast cancer Mother 69  . Breast cancer Sister 42  . Breast cancer Maternal Aunt 60       +calcifications  . Prostate cancer Maternal Uncle 67  . Heart attack Maternal Grandmother   . Heart attack Maternal Grandfather   . Prostate cancer Maternal Grandfather 43  . Diabetes Paternal Grandmother   . Pancreatitis Paternal Grandfather   . Prostate cancer Cousin        dx. late 84s    Social History:  reports that she has never smoked. She has never used smokeless tobacco. She reports current alcohol use of about 1.0 standard drinks of alcohol per week. She reports that she does not use drugs.  Allergies:  Allergies  Allergen Reactions  . Sulfa Antibiotics Hives    Medications Prior to Admission  Medication Sig Dispense Refill Last Dose  . ALPRAZolam (XANAX) 0.5 MG tablet Take 1 tablet (0.5 mg total) by mouth at bedtime as needed for anxiety. Reported on 01/09/2016 10 tablet 0 Past Month at Unknown time  . ibuprofen (ADVIL,MOTRIN) 200 MG tablet Take 400-800 mg by mouth every 8 (eight) hours as needed (pain).    Past Month at Unknown time  . tamoxifen (NOLVADEX) 20 MG tablet TAKE 1 TABLET BY MOUTH EVERY DAY 90 tablet  3 11/22/2018    Review of Systems  All other systems reviewed and are negative.   Blood pressure 138/86, pulse 62, temperature (!) 97.5 F (36.4 C), temperature source Oral, resp. rate 16, height 5' 6.5" (1.689 m), weight 113.9 kg, SpO2 100 %. Physical Exam  Constitutional: She is oriented to person, place, and time. She appears well-developed and well-nourished.  Cardiovascular: Normal rate and regular rhythm.  Respiratory: Effort normal and breath sounds normal.  Neurological: She is alert and oriented to person, place, and time.  Skin: Skin is warm and dry.  Psychiatric: She has a normal mood  and affect.    No results found for this or any previous visit (from the past 24 hour(s)).  No results found.  Assessment/Plan: 48 yo G6P2 DAAF here for TLH/bilateral salpingectomy/cystoscopy possible BSO for socially embarrassing menorrhagia, anemia, fibroid uterus.  Questions answered.  Pt ready to proceed.  Megan Salon 11/29/2018, 7:19 AM

## 2018-11-29 NOTE — Anesthesia Postprocedure Evaluation (Signed)
Anesthesia Post Note  Patient: Rhonda Moss  Procedure(s) Performed: TOTAL LAPAROSCOPIC HYSTERECTOMY WITH SALPINGECTOMY (Bilateral Abdomen) CYSTOSCOPY (N/A Urethra)     Patient location during evaluation: PACU Anesthesia Type: General Level of consciousness: awake and alert Pain management: pain level controlled Vital Signs Assessment: post-procedure vital signs reviewed and stable Respiratory status: spontaneous breathing, nonlabored ventilation, respiratory function stable and patient connected to nasal cannula oxygen Cardiovascular status: blood pressure returned to baseline and stable Postop Assessment: no apparent nausea or vomiting Anesthetic complications: no    Last Vitals:  Vitals:   11/29/18 0538 11/29/18 1320  BP: 138/86 109/74  Pulse: 62 69  Resp: 16 (!) 9  Temp: (!) 36.4 C 36.5 C  SpO2: 100% 100%    Last Pain:  Vitals:   11/29/18 1320  TempSrc:   PainSc: Asleep                 Diaz Crago

## 2018-11-30 ENCOUNTER — Other Ambulatory Visit: Payer: Self-pay | Admitting: Obstetrics & Gynecology

## 2018-11-30 DIAGNOSIS — N920 Excessive and frequent menstruation with regular cycle: Secondary | ICD-10-CM | POA: Diagnosis not present

## 2018-11-30 LAB — CBC
HCT: 25.9 % — ABNORMAL LOW (ref 36.0–46.0)
Hemoglobin: 8.2 g/dL — ABNORMAL LOW (ref 12.0–15.0)
MCH: 30.5 pg (ref 26.0–34.0)
MCHC: 31.7 g/dL (ref 30.0–36.0)
MCV: 96.3 fL (ref 80.0–100.0)
Platelets: 193 10*3/uL (ref 150–400)
RBC: 2.69 MIL/uL — ABNORMAL LOW (ref 3.87–5.11)
RDW: 14.3 % (ref 11.5–15.5)
WBC: 8.9 10*3/uL (ref 4.0–10.5)
nRBC: 0 % (ref 0.0–0.2)

## 2018-11-30 LAB — BASIC METABOLIC PANEL
Anion gap: 6 (ref 5–15)
BUN: 14 mg/dL (ref 6–20)
CO2: 25 mmol/L (ref 22–32)
Calcium: 8.1 mg/dL — ABNORMAL LOW (ref 8.9–10.3)
Chloride: 108 mmol/L (ref 98–111)
Creatinine, Ser: 1.23 mg/dL — ABNORMAL HIGH (ref 0.44–1.00)
GFR calc Af Amer: >60 mL/min (ref >60)
GFR calc non Af Amer: 52 mL/min — ABNORMAL LOW (ref >60)
GLUCOSE: 106 mg/dL — AB (ref 70–99)
Potassium: 4 mmol/L (ref 3.5–5.1)
Sodium: 139 mmol/L (ref 135–145)

## 2018-11-30 MED ORDER — FLUCONAZOLE 150 MG PO TABS: ORAL_TABLET | ORAL | 1 refills | Status: DC

## 2018-11-30 MED ORDER — IBUPROFEN 200 MG PO TABS: 800.0000 mg | ORAL_TABLET | Freq: Three times a day (TID) | ORAL | 0 refills | Status: DC | PRN

## 2018-11-30 MED ORDER — OXYCODONE-ACETAMINOPHEN 5-325 MG PO TABS: 1.0000 | ORAL_TABLET | ORAL | 0 refills | Status: DC | PRN

## 2018-11-30 NOTE — Discharge Instructions (Signed)
Laparoscopically Assisted Vaginal Hysterectomy, Care After °This sheet gives you information about how to care for yourself after your procedure. Your health care provider may also give you more specific instructions. If you have problems or questions, contact your health care provider. °What can I expect after the procedure? °After the procedure, it is common to have: °· Soreness and numbness in your incision areas. °· Abdominal pain. You will be given pain medicine to control it. °· Vaginal bleeding and discharge. You will need to use a sanitary napkin after this procedure. °· Sore throat from the breathing tube that was inserted during surgery. °Follow these instructions at home: °Medicines °· Take over-the-counter and prescription medicines only as told by your health care provider. °· Do not take aspirin or ibuprofen. These medicines can cause bleeding. °· Do not drive or use heavy machinery while taking prescription pain medicine. °· Do not drive for 24 hours if you were given a medicine to help you relax (sedative) during the procedure. °Incision care ° °· Follow instructions from your health care provider about how to take care of your incisions. Make sure you: °? Wash your hands with soap and water before you change your bandage (dressing). If soap and water are not available, use hand sanitizer. °? Change your dressing as told by your health care provider. °? Leave stitches (sutures), skin glue, or adhesive strips in place. These skin closures may need to stay in place for 2 weeks or longer. If adhesive strip edges start to loosen and curl up, you may trim the loose edges. Do not remove adhesive strips completely unless your health care provider tells you to do that. °· Check your incision area every day for signs of infection. Check for: °? Redness, swelling, or pain. °? Fluid or blood. °? Warmth. °? Pus or a bad smell. °Activity °· Get regular exercise as told by your health care provider. You may be  told to take short walks every day and go farther each time. °· Return to your normal activities as told by your health care provider. Ask your health care provider what activities are safe for you. °· Do not douche, use tampons, or have sexual intercourse for at least 6 weeks, or until your health care provider gives you permission. °· Do not lift anything that is heavier than 10 lb (4.5 kg), or the limit that your health care provider tells you, until he or she says that it is safe. °General instructions °· Do not take baths, swim, or use a hot tub until your health care provider approves. Take showers instead of baths. °· Do not drive for 24 hours if you received a sedative. °· Do not drive or operate heavy machinery while taking prescription pain medicine. °· To prevent or treat constipation while you are taking prescription pain medicine, your health care provider may recommend that you: °? Drink enough fluid to keep your urine clear or pale yellow. °? Take over-the-counter or prescription medicines. °? Eat foods that are high in fiber, such as fresh fruits and vegetables, whole grains, and beans. °? Limit foods that are high in fat and processed sugars, such as fried and sweet foods. °· Keep all follow-up visits as told by your health care provider. This is important. °Contact a health care provider if: °· You have signs of infection, such as: °? Redness, swelling, or pain around your incision sites. °? Fluid or blood coming from an incision. °? An incision that feels warm to the   touch. ? Pus or a bad smell coming from an incision.  Your incision breaks open.  Your pain medicine is not helping.  You feel dizzy or light-headed.  You have pain or bleeding when you urinate.  You have persistent nausea and vomiting.  You have blood, pus, or a bad-smelling discharge from your vagina. Get help right away if:  You have a fever.  You have severe abdominal pain.  You have chest pain.  You have  shortness of breath.  You faint.  You have pain, swelling, or redness in your leg.  You have heavy bleeding from your vagina. Summary  After the procedure, it is common to have abdominal pain and vaginal bleeding.  You should not drive or lift heavy objects until your health care provider says that it is safe.  Contact your health care provider if you have any symptoms of infection, excessive vaginal bleeding, nausea, vomiting, or shortness of breath. This information is not intended to replace advice given to you by your health care provider. Make sure you discuss any questions you have with your health care provider. Document Released: 10/23/2011 Document Revised: 12/30/2016 Document Reviewed: 12/30/2016 Elsevier Interactive Patient Education  2019 [employer].

## 2018-11-30 NOTE — Progress Notes (Signed)
Dr. Sabra Heck was notified of patient's BMET results. Also , patient stated she wanted a yeast infection pill. Dr. Sabra Heck made aware and stated she would send it over to patient's pharmacy. Discharge instructions went over by RN with patient.

## 2018-11-30 NOTE — Progress Notes (Signed)
1 Day Post-Op Procedure(s) (LRB): TOTAL LAPAROSCOPIC HYSTERECTOMY WITH SALPINGECTOMY (Bilateral) CYSTOSCOPY (N/A)  Subjective: Patient reports no nausea and good pain control.  Walking, eating, voiding without difficulty.  Minimal spotting.      Objective: I have reviewed patient's vital signs, intake and output, medications and labs. Hb: 8.3 Vitals:   11/30/18 0127 11/30/18 0602  BP: (!) 94/54 99/60  Pulse: 60 (!) 58  Resp: 16 16  Temp: 98.4 F (36.9 C) 98.3 F (36.8 C)  SpO2: 100% 100%     General: alert and cooperative Resp: clear to auscultation bilaterally Cardio: regular rate and rhythm, S1, S2 normal, no murmur, click, rub or gallop GI: soft, non-tender; bowel sounds normal; no masses,  no organomegaly and incision: clean, dry and intact Extremities: extremities normal, atraumatic, no cyanosis or edema Vaginal Bleeding: minimal  Assessment: s/p Procedure(s) with comments: TOTAL LAPAROSCOPIC HYSTERECTOMY WITH SALPINGECTOMY (Bilateral) - Possible BSO. Extended recovery bed needed CYSTOSCOPY (N/A): stable and progressing well  Plan: Encourage ambulation Discharge home  LOS: 0 days    Megan Salon 11/30/2018, 7:46 AM

## 2018-12-01 ENCOUNTER — Encounter (HOSPITAL_BASED_OUTPATIENT_CLINIC_OR_DEPARTMENT_OTHER): Payer: Self-pay | Admitting: Obstetrics & Gynecology

## 2018-12-02 ENCOUNTER — Telehealth: Payer: Self-pay | Admitting: Obstetrics & Gynecology

## 2018-12-02 NOTE — Telephone Encounter (Signed)
Call placed to [facility] on file, spoke with [place]. Was advised diflucan Rx received and is ready for pick up.   Call returned to patient, advised as seen above, f/u with pharmacy to pick up Rx. Return call if any additional questions/concerns.   Encounter closed.

## 2018-12-02 NOTE — Telephone Encounter (Signed)
Patient states pharmacy did not receive prescription for diflucan.

## 2018-12-07 ENCOUNTER — Ambulatory Visit (INDEPENDENT_AMBULATORY_CARE_PROVIDER_SITE_OTHER): Payer: BLUE CROSS/BLUE SHIELD | Admitting: Obstetrics & Gynecology

## 2018-12-07 ENCOUNTER — Other Ambulatory Visit: Payer: Self-pay

## 2018-12-07 VITALS — BP 130/88 | HR 68 | Temp 98.1°F | Resp 16 | Ht 66.5 in | Wt 249.4 lb

## 2018-12-07 DIAGNOSIS — Z9889 Other specified postprocedural states: Secondary | ICD-10-CM

## 2018-12-07 DIAGNOSIS — D508 Other iron deficiency anemias: Secondary | ICD-10-CM

## 2018-12-07 LAB — HEMOGLOBIN: Hemoglobin: 11.8

## 2018-12-07 NOTE — Progress Notes (Signed)
Post Operative Visit  Procedure: Total Laparoscopic Hysterectomy with Salpingectomy, Cystoscopy.  Days Post-op: 8  Subjective: Doing well.  Had some issues with post op constipation.  Drank a tea for this and she's having more normal bowel movements.  Taking no pain medications.  Denies vaginal bleeding.  Is having fatigue.  She is working some from home.  Has not driven by herself yet.  Having hot flashes but this is manageable.    Objective: BP 130/88 (BP Location: Right Arm, Patient Position: Sitting, Cuff Size: Large)   Pulse 68   Temp 98.1 F (36.7 C) (Oral)   Resp 16   Ht 5' 6.5" (1.689 m)   Wt 249 lb 6.4 oz (113.1 kg)   LMP 08/31/2018 (Approximate)   BMI 39.65 kg/m   EXAM  General: alert and no distress Resp: clear to auscultation bilaterally Cardio: regular rate and rhythm, S1, S2 normal, no murmur, click, rub or gallop GI: soft, non-tender; bowel sounds normal; no masses,  no organomegaly and incision: clean, dry and intact Extremities: extremities normal, atraumatic, no cyanosis or edema Vaginal Bleeding: none  Assessment: s/p TLH/LSO, right salpingectomy/cystoscopy due to menorrhagia, fibroids, adhesions, h/o cesarean section and h/o ectopic pregnancy x 2  Plan: Recheck 3 weeks. Will plan to obtain CBC with iron levels at that time.

## 2018-12-09 ENCOUNTER — Encounter: Payer: Self-pay | Admitting: Obstetrics & Gynecology

## 2019-01-11 ENCOUNTER — Ambulatory Visit: Payer: BLUE CROSS/BLUE SHIELD | Admitting: Obstetrics & Gynecology

## 2019-01-12 ENCOUNTER — Telehealth: Payer: Self-pay | Admitting: Obstetrics & Gynecology

## 2019-01-12 NOTE — Telephone Encounter (Signed)
I left a message for this patient to call and reschedule her 6 week post op appointment from 01/11/19 due to MD delay in surgery.

## 2019-01-20 ENCOUNTER — Other Ambulatory Visit: Payer: Self-pay

## 2019-01-20 ENCOUNTER — Encounter: Payer: Self-pay | Admitting: Obstetrics & Gynecology

## 2019-01-20 ENCOUNTER — Ambulatory Visit (INDEPENDENT_AMBULATORY_CARE_PROVIDER_SITE_OTHER): Payer: BLUE CROSS/BLUE SHIELD | Admitting: Obstetrics & Gynecology

## 2019-01-20 VITALS — BP 114/80 | HR 64 | Resp 16 | Ht 66.5 in | Wt 247.0 lb

## 2019-01-20 DIAGNOSIS — R6882 Decreased libido: Secondary | ICD-10-CM | POA: Diagnosis not present

## 2019-01-20 DIAGNOSIS — N898 Other specified noninflammatory disorders of vagina: Secondary | ICD-10-CM | POA: Diagnosis not present

## 2019-01-20 DIAGNOSIS — D500 Iron deficiency anemia secondary to blood loss (chronic): Secondary | ICD-10-CM

## 2019-01-20 DIAGNOSIS — R350 Frequency of micturition: Secondary | ICD-10-CM

## 2019-01-20 LAB — POCT URINALYSIS DIPSTICK
Bilirubin, UA: NEGATIVE
Blood, UA: NEGATIVE
GLUCOSE UA: NEGATIVE
Ketones, UA: NEGATIVE
Leukocytes, UA: NEGATIVE
Nitrite, UA: NEGATIVE
Protein, UA: NEGATIVE
Urobilinogen, UA: 0.2 E.U./dL
pH, UA: 6 (ref 5.0–8.0)

## 2019-01-20 MED ORDER — MIRABEGRON ER 25 MG PO TB24: 25.0000 mg | ORAL_TABLET | Freq: Every day | ORAL | 2 refills | Status: DC

## 2019-01-20 NOTE — Progress Notes (Signed)
GYNECOLOGY  VISIT  CC:   Post op follow up and discuss other issues/concerns  HPI: 48 y.o. [id number] Divorced Black or [place] American female here for post op follow up and to discuss several other concerns.  From surgical standpoint, she is doing well.  Having some urinary urgency and urinary frequency.  Has experienced urinary frequency in the past and feels this is due to the amount of water she drinks.  However, the urgency seems new.  At times feels like she won't get to the bathroom.  Denies urinary leakage.  Denies abnormal discharge.  No vaginal bleeding.  Energy is good.  Having regular bowel movements almost every day.  Separate and unrelated issue.  Since being on tamoxifen, feels she has decreased libido.  Just has no desire.  Wants to discuss options.  Do not feel testosterone is a good option for her due conversion of estrogen.  Addyi discussed.  Not really interested in daily medication. Marland Kitchen  GYNECOLOGIC HISTORY: Patient's last menstrual period was 08/31/2018 (approximate). Contraception: hysterectomy Menopausal hormone therapy: none, on Tamoxifen for h/o breast cancer  Patient Active Problem List   Diagnosis Date Noted  . Anemia 11/29/2018  . Genetic testing 05/07/2015  . Malignant neoplasm of upper-outer quadrant of left breast in female, estrogen receptor positive ([place]) 04/29/2015  . Family history of breast cancer   . Family history of prostate cancer     Past Medical History:  Diagnosis Date  . Allergy   . Anxiety   . Breast cancer ([place]) dx. 44 04/11/15   ILC of left breast  . Colon polyps   . Ectopic pregnancy   . Family history of breast cancer     Past Surgical History:  Procedure Laterality Date  . BREAST LUMPECTOMY WITH RADIOACTIVE SEED AND SENTINEL LYMPH NODE BIOPSY Left 06/22/2015   Procedure: LEFT BREAST LUMPECTOMY WITH RADIOACTIVE SEED AND SENTINEL LYMPH NODE BIOPSY;  Surgeon: Coralie Keens, MD;  Location: [facility];  Service: General;   Laterality: Left;  . CERVICAL BIOPSY  W/ LOOP ELECTRODE EXCISION  1991  . CESAREAN SECTION  2002  . CHOLECYSTECTOMY  1998  . CYSTOSCOPY N/A 11/29/2018   Procedure: CYSTOSCOPY;  Surgeon: Megan Salon, MD;  Location: [facility];  Service: Gynecology;  Laterality: N/A;  . LYMPHADENECTOMY  1992   single node in right neck  . SALPINGECTOMY Left 10/2005  . STRABISMUS SURGERY  2016  . TOTAL LAPAROSCOPIC HYSTERECTOMY WITH SALPINGECTOMY Bilateral 11/29/2018   Procedure: TOTAL LAPAROSCOPIC HYSTERECTOMY WITH SALPINGECTOMY;  Surgeon: Megan Salon, MD;  Location: [facility];  Service: Gynecology;  Laterality: Bilateral;  Possible BSO. Extended recovery bed needed    MEDS:   Current Outpatient Medications on File Prior to Visit  Medication Sig Dispense Refill  . ALPRAZolam (XANAX) 0.5 MG tablet Take 1 tablet (0.5 mg total) by mouth at bedtime as needed for anxiety. Reported on 01/09/2016 10 tablet 0  . ibuprofen (ADVIL,MOTRIN) 200 MG tablet Take 4 tablets (800 mg total) by mouth every 8 (eight) hours as needed for headache, mild pain, moderate pain or cramping (pain). 30 tablet 0  . tamoxifen (NOLVADEX) 20 MG tablet TAKE 1 TABLET BY MOUTH EVERY DAY 90 tablet 3   No current facility-administered medications on file prior to visit.     ALLERGIES: Sulfa antibiotics  Family History  Problem Relation Age of Onset  . Breast cancer Mother 20  . Breast cancer Sister 66  . Breast cancer Maternal Aunt  60       +calcifications  . Prostate cancer Maternal Uncle 67  . Heart attack Maternal Grandmother   . Heart attack Maternal Grandfather   . Prostate cancer Maternal Grandfather 32  . Diabetes Paternal Grandmother   . Pancreatitis Paternal Grandfather   . Prostate cancer Cousin        dx. late 84s    SH:  Married, non smoker  Review of Systems  Genitourinary: Positive for frequency and urgency.  All other systems reviewed and are negative.   PHYSICAL  EXAMINATION:    BP 114/80 (BP Location: Right Arm, Patient Position: Sitting, Cuff Size: Large)   Pulse 64   Resp 16   Ht 5' 6.5" (1.689 m)   Wt 247 lb (112 kg)   LMP 08/31/2018 (Approximate)   BMI 39.27 kg/m     General appearance: alert, cooperative and appears stated age Abdomen: soft, non-tender; bowel sounds normal; no masses,  no organomegaly Lymph:  no inguinal LAD noted  Pelvic: External genitalia:  no lesions              Urethra:  normal appearing urethra with no masses, tenderness or lesions              Bartholins and Skenes: normal                 Vagina: normal appearing vagina with normal color, white discharge noted, no lesions, cuff healing well              Cervix: absent              Bimanual Exam:  Uterus:  uterus absent              Adnexa: no mass, fullness, tenderness  Chaperone was present for exam.  Assessment: s/p TLH/LSO with right salpingectomy/cystoscopy Vaginal discharge Urinary urgency and frequency H/o anemia Decreased libido  Plan: Affirm pending CBC and iron studies Urine culture pending.  If negative, will try Myrbetriq 96m daily.  Rx to pharmacy.  Hypertension risk discussed.  She is going to check BP and knows to call if elevated. Not interested in any option we discussed for libido symptoms Does need AEX 1 year and sooner appt for any new issues/concerns   In addition to routine post op appt, about 15 minutes spent discussing urinary issues/concerns, libido issues, and vaginal discharge physical exam finding.

## 2019-01-21 LAB — CBC
Hematocrit: 34.1 % (ref 34.0–46.6)
Hemoglobin: 10.9 g/dL — ABNORMAL LOW (ref 11.1–15.9)
MCH: 29.1 pg (ref 26.6–33.0)
MCHC: 32 g/dL (ref 31.5–35.7)
MCV: 91 fL (ref 79–97)
PLATELETS: 297 10*3/uL (ref 150–450)
RBC: 3.75 x10E6/uL — AB (ref 3.77–5.28)
RDW: 14.2 % (ref 11.7–15.4)
WBC: 4.7 10*3/uL (ref 3.4–10.8)

## 2019-01-21 LAB — IRON,TIBC AND FERRITIN PANEL
Ferritin: 54 ng/mL (ref 15–150)
IRON: 100 ug/dL (ref 27–159)
Iron Saturation: 29 % (ref 15–55)
Total Iron Binding Capacity: 340 ug/dL (ref 250–450)
UIBC: 240 ug/dL (ref 131–425)

## 2019-01-21 LAB — VAGINITIS/VAGINOSIS, DNA PROBE
Candida Species: NEGATIVE
Gardnerella vaginalis: NEGATIVE
Trichomonas vaginosis: NEGATIVE

## 2019-01-21 LAB — URINE CULTURE: Organism ID, Bacteria: NO GROWTH

## 2019-03-17 ENCOUNTER — Other Ambulatory Visit: Payer: Self-pay | Admitting: Obstetrics & Gynecology

## 2019-03-29 ENCOUNTER — Telehealth: Payer: Self-pay | Admitting: *Deleted

## 2019-03-29 NOTE — Telephone Encounter (Signed)
Spoke with patient. Patient states that her Myrbetriq is too costly due to change with insurance coverage. Patient has been off the medication for 5 days without any problems. Was started on this following surgery due to urinary urgency and frequency. Asking if she is okay to discontinue this medication or if Dr.Miller feels she needs to continue with a different medication at this time.

## 2019-03-29 NOTE — Telephone Encounter (Signed)
Patient would like to speak with nurse about a medication.

## 2019-03-31 NOTE — Telephone Encounter (Signed)
It's fine for her to just see how she feels.  Sometimes a woman will have some bladder spasms after hysterectomy that typically resolve.  Ok to call back if symptoms change.  Thanks.

## 2019-03-31 NOTE — Telephone Encounter (Signed)
Spoke with patient. Message given as seen below from [place]. Patient verbalizes understanding. Encounter closed.

## 2019-04-01 ENCOUNTER — Other Ambulatory Visit: Payer: Self-pay | Admitting: Oncology

## 2019-04-01 DIAGNOSIS — Z853 Personal history of malignant neoplasm of breast: Secondary | ICD-10-CM

## 2019-05-09 ENCOUNTER — Other Ambulatory Visit: Payer: Self-pay

## 2019-05-09 ENCOUNTER — Ambulatory Visit
Admission: RE | Admit: 2019-05-09 | Discharge: 2019-05-09 | Disposition: A | Discharge status: Home / Self Care | Payer: BC Managed Care – PPO | Source: Ambulatory Visit | Attending: Oncology | Admitting: Oncology

## 2019-05-09 DIAGNOSIS — Z853 Personal history of malignant neoplasm of breast: Secondary | ICD-10-CM

## 2019-05-09 DIAGNOSIS — R928 Other abnormal and inconclusive findings on diagnostic imaging of breast: Secondary | ICD-10-CM | POA: Diagnosis not present

## 2019-05-09 HISTORY — DX: Personal history of irradiation: Z92.3

## 2019-07-12 NOTE — Progress Notes (Signed)
[place] [facility]  Telephone:([phone]) [phone] [phone]) [phone]     ID: Rhonda Moss DOB: 05/04/1971  MR#: [id number]  BBC#:[id number]  Patient Care Team: Patient, No Pcp Per as PCP - General (General Practice) Magrinat, Virgie Dad, MD as Consulting Physician (Oncology) Coralie Keens, MD as Consulting Physician (General Surgery) Thea Silversmith, MD as Consulting Physician (Radiation Oncology) Sylvan Cheese, NP as Nurse Practitioner (Hematology and Oncology) PCP: Patient, No Pcp Per OTHER MD:    CHIEF COMPLAINT: Estrogen receptor positive left breast cancer  CURRENT TREATMENT:  Tamoxifen   BREAST CANCER HISTORY: From the original intake note:  The patient had routine bilateral screening mammography at the breast center 03/13/2015 showing a possible mass in the left breast. Left diagnostic mammography with tomosynthesis and left breast ultrasonography 04/04/2015 found the breast density to be category C area did in the left breast upper outer quadrant there was a persistent mass which by ultrasound was irregular and hypoechoic. It measured 0.7 cm. In the left axilla there was a normal size lymph node with eccentric cortical thickening measuring 4 mm in maximal thickness.  On 04/11/2015 the patient underwent biopsy of the left breast mass in question and this showed (SAA [phone]) and invasive lobular carcinoma, E-cadherin negative, grade 1 or 2, estrogen receptor 99% positive, progesterone receptor 90% positive, both with strong staining intensity, with an MIB-1 of 19%. HER-2 was not amplified, the signals ratio being 1.09 and the number per cell 1.90.  The patient's case was presented at the multidisciplinary breast cancer conference 04/18/2015. At that time it was recommended that the patient be referred to genetics and undergo breast MRI.   Her subsequent history is as detailed below   INTERVAL HISTORY: Rhonda Moss returns today for follow-up and treatment of her  estrogen receptor positive breast cancer. She was last seen here on 07/13/2018.   She continues on tamoxifen.  She tolerates this well with no significant side effects.  Since her last visit here, she underwent a digital diagnostic bilateral mammogram with tomography on 05/09/2019 showing: Breast Density Category B. There is no mammographic evidence for malignancy.   Also after a significant episode of metrorrhagia as she underwent hysterectomy with unilateral salpingo-oophorectomy (the right ovary still in place) 11/29/2018.  The pathology (SZ be [id number]) was benign.   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  She is distancing appropriately and is keeping appropriate pandemic precautions.  Detailed review of systems today was otherwise noncontributory   PAST MEDICAL HISTORY: Past Medical History:  Diagnosis Date  . Allergy   . Anxiety   . Breast cancer ([place]) dx. 44 04/11/15   ILC of left breast  . Colon polyps   . Ectopic pregnancy   . Family history of breast cancer   . Personal history of radiation therapy     PAST SURGICAL HISTORY: Past Surgical History:  Procedure Laterality Date  . BREAST BIOPSY Left 03/2015  . BREAST LUMPECTOMY Left 06/2015  . BREAST LUMPECTOMY WITH RADIOACTIVE SEED AND SENTINEL LYMPH NODE BIOPSY Left 06/22/2015   Procedure: LEFT BREAST LUMPECTOMY WITH RADIOACTIVE SEED AND SENTINEL LYMPH NODE BIOPSY;  Surgeon: Coralie Keens, MD;  Location: [facility];  Service: General;  Laterality: Left;  . CERVICAL BIOPSY  W/ LOOP ELECTRODE EXCISION  1991  . CESAREAN SECTION  2002  . CHOLECYSTECTOMY  1998  . CYSTOSCOPY N/A 11/29/2018   Procedure: CYSTOSCOPY;  Surgeon: Megan Salon, MD;  Location: [facility];  Service: Gynecology;  Laterality: N/A;  . LYMPHADENECTOMY  1992   single node in right neck  . SALPINGECTOMY Left 10/2005  . STRABISMUS SURGERY  2016  . TOTAL LAPAROSCOPIC HYSTERECTOMY WITH  SALPINGECTOMY Bilateral 11/29/2018   Procedure: TOTAL LAPAROSCOPIC HYSTERECTOMY WITH SALPINGECTOMY;  Surgeon: Megan Salon, MD;  Location: [facility];  Service: Gynecology;  Laterality: Bilateral;  Possible BSO. Extended recovery bed needed    FAMILY HISTORY Family History  Problem Relation Age of Onset  . Breast cancer Mother 56  . Breast cancer Sister 20  . Breast cancer Maternal Aunt 60       +calcifications  . Prostate cancer Maternal Uncle 67  . Heart attack Maternal Grandmother   . Heart attack Maternal Grandfather   . Prostate cancer Maternal Grandfather 61  . Diabetes Paternal Grandmother   . Pancreatitis Paternal Grandfather   . Prostate cancer Cousin        dx. late 20s  the patient's parents are living, in their late 51s. The patient has one brother, and one sister. The patient's mother was diagnosed with breast cancer at age 3 and  One of the patient's mother's 2 sisters was also diagnosed with breast cancer at age 28. The patient's on sister was diagnosed with breast cancer at age 67. She was tested for the BRCA gene mutations and was negative.  GYNECOLOGIC HISTORY:  Patient's last menstrual period was 08/31/2018 (approximate). Menarche age 66 first live birth age 65. The patient is GX P2. Her periods are regular, lasting between 5 and 7 days. They're now slightly better controlled on a NuvaRing IUD.   SOCIAL HISTORY: (Updated August 2018) [place] does real estate selling and appraising. Her job involves a great deal of computer work and some driving, occasionally some walking. In October 2017 she married Rhonda Moss, who works for [employer]. At home her son Rhonda Moss, currently 42 years old, is very active and baseball and several coaches from [place] are according him. The patient's daughter Rhonda Moss, will graduate from recreational therapy this December and proceed to a graduate degree. sometimes stays at home. She is studying sports medicine.     ADVANCED DIRECTIVES: Not in place. At the initial clinic visit 07/13/2019 the patient was given the appropriate forms to complete and notarize at her discretion.  HEALTH MAINTENANCE: Social History   Tobacco Use  . Smoking status: Never Smoker  . Smokeless tobacco: Never Used  Substance Use Topics  . Alcohol use: Yes    Alcohol/week: 1.0 standard drinks    Types: 1 Standard drinks or equivalent per week    Comment: social  . Drug use: No     Colonoscopy:  PAP: NOV 2015  Bone density:  Lipid panel:  Allergies  Allergen Reactions  . Sulfa Antibiotics Hives    Current Outpatient Medications  Medication Sig Dispense Refill  . ALPRAZolam (XANAX) 0.5 MG tablet Take 1 tablet (0.5 mg total) by mouth at bedtime as needed for anxiety. Reported on 01/09/2016 10 tablet 0  . ibuprofen (ADVIL,MOTRIN) 200 MG tablet Take 4 tablets (800 mg total) by mouth every 8 (eight) hours as needed for headache, mild pain, moderate pain or cramping (pain). 30 tablet 0  . mirabegron ER (MYRBETRIQ) 25 MG TB24 tablet Take 1 tablet (25 mg total) by mouth daily. One po qd 30 tablet 2  . tamoxifen (NOLVADEX) 20 MG tablet TAKE 1 TABLET BY MOUTH EVERY DAY 90 tablet 3   No current facility-administered medications for this  visit.     OBJECTIVE: Morbidly obese African-American woman who appears well  Vitals:   07/13/19 1001  BP: (!) 160/79  Pulse: 70  Resp: 18  Temp: 98.2 F (36.8 C)  SpO2: 100%   Wt Readings from Last 3 Encounters:  07/13/19 263 lb 1.6 oz (119.3 kg)  01/20/19 247 lb (112 kg)  12/07/18 249 lb 6.4 oz (113.1 kg)   Body mass index is 41.83 kg/m.    ECOG FS:1 - Symptomatic but completely ambulatory  Ocular: Sclerae unicteric, pupils round and equal Ear-nose-throat: Wearing a mask Lymphatic: No cervical or supraclavicular adenopathy Lungs no rales or rhonchi Heart regular rate and rhythm Abd soft, nontender, positive bowel sounds MSK no focal spinal tenderness, no joint edema  Neuro: non-focal, well-oriented, appropriate affect Breasts: The right breast is benign.  The left breast has undergone lumpectomy and radiation.  There is no evidence of disease recurrence.  Both axillae are benign.  LAB RESULTS:  CMP     Component Value Date/Time   NA 142 07/13/2019 0937   NA 140 06/24/2017 1130   K 4.6 07/13/2019 0937   K 4.5 06/24/2017 1130   CL 108 07/13/2019 0937   CO2 26 07/13/2019 0937   CO2 27 06/24/2017 1130   GLUCOSE 94 07/13/2019 0937   GLUCOSE 90 06/24/2017 1130   BUN 15 07/13/2019 0937   BUN 13.2 06/24/2017 1130   CREATININE 1.11 (H) 07/13/2019 0937   CREATININE 1.1 06/24/2017 1130   CALCIUM 9.2 07/13/2019 0937   CALCIUM 9.6 06/24/2017 1130   PROT 7.2 07/13/2019 0937   PROT 7.5 06/24/2017 1130   ALBUMIN 3.6 07/13/2019 0937   ALBUMIN 3.5 06/24/2017 1130   AST 22 07/13/2019 0937   AST 25 06/24/2017 1130   ALT 15 07/13/2019 0937   ALT 19 06/24/2017 1130   ALKPHOS 65 07/13/2019 0937   ALKPHOS 69 06/24/2017 1130   BILITOT 0.7 07/13/2019 0937   BILITOT 0.70 06/24/2017 1130   GFRNONAA 59 (L) 07/13/2019 0937   GFRAA >60 07/13/2019 0937    INo results found for: SPEP, UPEP  Lab Results  Component Value Date   WBC 4.9 07/13/2019   NEUTROABS 2.5 07/13/2019   HGB 11.3 (L) 07/13/2019   HCT 34.4 (L) 07/13/2019   MCV 93.2 07/13/2019   PLT 233 07/13/2019      Chemistry      Component Value Date/Time   NA 142 07/13/2019 0937   NA 140 06/24/2017 1130   K 4.6 07/13/2019 0937   K 4.5 06/24/2017 1130   CL 108 07/13/2019 0937   CO2 26 07/13/2019 0937   CO2 27 06/24/2017 1130   BUN 15 07/13/2019 0937   BUN 13.2 06/24/2017 1130   CREATININE 1.11 (H) 07/13/2019 0937   CREATININE 1.1 06/24/2017 1130      Component Value Date/Time   CALCIUM 9.2 07/13/2019 0937   CALCIUM 9.6 06/24/2017 1130   ALKPHOS 65 07/13/2019 0937   ALKPHOS 69 06/24/2017 1130   AST 22 07/13/2019 0937   AST 25 06/24/2017 1130   ALT 15 07/13/2019 0937   ALT 19  06/24/2017 1130   BILITOT 0.7 07/13/2019 0937   BILITOT 0.70 06/24/2017 1130       No results found for: LABCA2  No components found for: QBHAL937  No results for input(s): INR in the last 168 hours.  Urinalysis    Component Value Date/Time   COLORURINE YELLOW 12/03/2014 [place] 12/03/2014 1415   LABSPEC 1.024  12/03/2014 1415   PHURINE 5.5 12/03/2014 1415   GLUCOSEU NEGATIVE 12/03/2014 1415   HGBUR NEGATIVE 12/03/2014 1415   BILIRUBINUR N 01/20/2019 0951   KETONESUR 15 (A) 12/03/2014 1415   PROTEINUR Negative 01/20/2019 0951   PROTEINUR NEGATIVE 12/03/2014 1415   UROBILINOGEN 0.2 01/20/2019 0951   UROBILINOGEN 0.2 12/03/2014 1415   NITRITE N 01/20/2019 0951   NITRITE NEGATIVE 12/03/2014 1415   LEUKOCYTESUR Negative 01/20/2019 0951    STUDIES: No results found.   ASSESSMENT: 48 y.o. [place] status post left breast upper outer quadrant biopsy 04/11/2015 for a clinical T1b NX invasive lobular breast cancer, grade 1 or 2, strongly estrogen and progesterone receptor positive, HER-2 not amplified, with an MIB-1 of 19%.  (1) genetics testing 05/04/2015 through the [place] panel/ [facility] showed no deleterious mutations in ATM, BARD1, BRCA1, BRCA2, BRIP1, CDH1, CHEK2, FANCC, MLH1, MSH2, MSH6, NBN, PALB2, PMS2, PTEN, RAD51C, RAD51D, STK11, TP53, and XRCC2. Furthermore, deletion/duplication analysis (without next-generation sequencing) was performed for the EPCAM gene.   (2) status post left lumpectomy and sentinel lymph node sampling 06/22/2015 for a pT1b pN0, stage iA invasive lobular breast cancer, grade 1, repeat HER-2 again negative, with ample margins   (3)  Continuing adjuvant tamoxifen, started neoadjuvantly June 2016  (4) Oncotype DX score of 22 predicts a risk of recurrence outside the breast within the next 10 years of 14% if the patient's only systemic treatment is tamoxifen for 5 years.   (a)  The patient opted  to forego chemotherapy given the very marginal anticipated benefit  (5)  Adjuvant radiation 08/13/15-09/25/15:  Left breast/ 45 Gy at 1.8 Gy per fraction x 25 fractions.  Left breast boost/ 14 Gy at 2 Gy per fraction x 8 fractions  (6) status post hysterectomy and left salpingo-oophorectomy (right ovary still in place) 11/29/2018 with benign pathology  PLAN: Alajiah is now a little over 4 years out from definitive surgery for her breast cancer with no evidence of disease recurrence.  This is very favorable.  She is tolerating tamoxifen well and the plan is to continue that with all of 5 years which will take as to June of next year.  She did well with her hysterectomy and is having no menopausal symptoms at present.  We discussed her mild anemia which is going to be related to her earlier blood loss.  She cannot tolerate oral iron, but she is going to try to eat some red meat and see if that corrects it.  Otherwise she will see me again in a year and that will be her "graduation" visit  She knows to call for any other issue that may develop before then.  Magrinat, Virgie Dad, MD  07/13/19 10:18 AM Medical Oncology and Hematology [facility] [address] [place], [place] [address] Tel. [phone]    Fax. [phone]  I, Jacqualyn Posey am acting as a Education administrator for Chauncey Cruel, MD.   I, Lurline Del MD, have reviewed the above documentation for accuracy and completeness, and I agree with the above.

## 2019-07-13 ENCOUNTER — Inpatient Hospital Stay: Payer: BC Managed Care – PPO | Attending: Oncology

## 2019-07-13 ENCOUNTER — Inpatient Hospital Stay (HOSPITAL_BASED_OUTPATIENT_CLINIC_OR_DEPARTMENT_OTHER): Payer: BC Managed Care – PPO | Admitting: Oncology

## 2019-07-13 ENCOUNTER — Other Ambulatory Visit: Payer: Self-pay

## 2019-07-13 VITALS — BP 160/79 | HR 70 | Temp 98.2°F | Resp 18 | Ht 66.5 in | Wt 263.1 lb

## 2019-07-13 DIAGNOSIS — Z79899 Other long term (current) drug therapy: Secondary | ICD-10-CM | POA: Insufficient documentation

## 2019-07-13 DIAGNOSIS — Z882 Allergy status to sulfonamides status: Secondary | ICD-10-CM | POA: Diagnosis not present

## 2019-07-13 DIAGNOSIS — C50412 Malignant neoplasm of upper-outer quadrant of left female breast: Secondary | ICD-10-CM

## 2019-07-13 DIAGNOSIS — Z90710 Acquired absence of both cervix and uterus: Secondary | ICD-10-CM | POA: Diagnosis not present

## 2019-07-13 DIAGNOSIS — Z6841 Body Mass Index (BMI) 40.0 and over, adult: Secondary | ICD-10-CM | POA: Diagnosis not present

## 2019-07-13 DIAGNOSIS — Z8042 Family history of malignant neoplasm of prostate: Secondary | ICD-10-CM | POA: Insufficient documentation

## 2019-07-13 DIAGNOSIS — Z8719 Personal history of other diseases of the digestive system: Secondary | ICD-10-CM | POA: Diagnosis not present

## 2019-07-13 DIAGNOSIS — Z79810 Long term (current) use of selective estrogen receptor modulators (SERMs): Secondary | ICD-10-CM | POA: Insufficient documentation

## 2019-07-13 DIAGNOSIS — Z8249 Family history of ischemic heart disease and other diseases of the circulatory system: Secondary | ICD-10-CM | POA: Insufficient documentation

## 2019-07-13 DIAGNOSIS — Z8379 Family history of other diseases of the digestive system: Secondary | ICD-10-CM | POA: Insufficient documentation

## 2019-07-13 DIAGNOSIS — N921 Excessive and frequent menstruation with irregular cycle: Secondary | ICD-10-CM | POA: Insufficient documentation

## 2019-07-13 DIAGNOSIS — Z170 Estrogen receptor positive status [ER+]: Secondary | ICD-10-CM

## 2019-07-13 DIAGNOSIS — Z833 Family history of diabetes mellitus: Secondary | ICD-10-CM | POA: Diagnosis not present

## 2019-07-13 DIAGNOSIS — Z803 Family history of malignant neoplasm of breast: Secondary | ICD-10-CM | POA: Diagnosis not present

## 2019-07-13 LAB — CBC WITH DIFFERENTIAL/PLATELET
Abs Immature Granulocytes: 0.01 10*3/uL (ref 0.00–0.07)
Basophils Absolute: 0 10*3/uL (ref 0.0–0.1)
Basophils Relative: 0 %
Eosinophils Absolute: 0.1 10*3/uL (ref 0.0–0.5)
Eosinophils Relative: 1 %
HCT: 34.4 % — ABNORMAL LOW (ref 36.0–46.0)
Hemoglobin: 11.3 g/dL — ABNORMAL LOW (ref 12.0–15.0)
Immature Granulocytes: 0 %
Lymphocytes Relative: 40 %
Lymphs Abs: 1.9 10*3/uL (ref 0.7–4.0)
MCH: 30.6 pg (ref 26.0–34.0)
MCHC: 32.8 g/dL (ref 30.0–36.0)
MCV: 93.2 fL (ref 80.0–100.0)
Monocytes Absolute: 0.4 10*3/uL (ref 0.1–1.0)
Monocytes Relative: 7 %
Neutro Abs: 2.5 10*3/uL (ref 1.7–7.7)
Neutrophils Relative %: 52 %
Platelets: 233 10*3/uL (ref 150–400)
RBC: 3.69 MIL/uL — ABNORMAL LOW (ref 3.87–5.11)
RDW: 12.5 % (ref 11.5–15.5)
WBC: 4.9 10*3/uL (ref 4.0–10.5)
nRBC: 0 % (ref 0.0–0.2)

## 2019-07-13 LAB — COMPREHENSIVE METABOLIC PANEL
ALT: 15 U/L (ref 0–44)
AST: 22 U/L (ref 15–41)
Albumin: 3.6 g/dL (ref 3.5–5.0)
Alkaline Phosphatase: 65 U/L (ref 38–126)
Anion gap: 8 (ref 5–15)
BUN: 15 mg/dL (ref 6–20)
CO2: 26 mmol/L (ref 22–32)
Calcium: 9.2 mg/dL (ref 8.9–10.3)
Chloride: 108 mmol/L (ref 98–111)
Creatinine, Ser: 1.11 mg/dL — ABNORMAL HIGH (ref 0.44–1.00)
GFR calc Af Amer: >60 mL/min (ref >60)
GFR calc non Af Amer: 59 mL/min — ABNORMAL LOW (ref >60)
Glucose, Bld: 94 mg/dL (ref 70–99)
Potassium: 4.6 mmol/L (ref 3.5–5.1)
Sodium: 142 mmol/L (ref 135–145)
Total Bilirubin: 0.7 mg/dL (ref 0.3–1.2)
Total Protein: 7.2 g/dL (ref 6.5–8.1)

## 2019-07-13 MED ORDER — TAMOXIFEN CITRATE 20 MG PO TABS: 20.0000 mg | ORAL_TABLET | Freq: Every day | ORAL | 4 refills | Status: DC

## 2019-07-15 ENCOUNTER — Telehealth: Payer: Self-pay | Admitting: Oncology

## 2019-07-15 NOTE — Telephone Encounter (Signed)
I talk with patient regarding schedule  

## 2020-01-20 ENCOUNTER — Other Ambulatory Visit: Payer: Self-pay

## 2020-01-23 ENCOUNTER — Encounter: Payer: Self-pay | Admitting: Obstetrics & Gynecology

## 2020-01-23 ENCOUNTER — Ambulatory Visit (INDEPENDENT_AMBULATORY_CARE_PROVIDER_SITE_OTHER): Payer: BC Managed Care – PPO | Admitting: Obstetrics & Gynecology

## 2020-01-23 ENCOUNTER — Other Ambulatory Visit: Payer: Self-pay

## 2020-01-23 ENCOUNTER — Telehealth: Payer: Self-pay | Admitting: Obstetrics & Gynecology

## 2020-01-23 VITALS — BP 150/88 | HR 68 | Temp 97.7°F | Resp 10 | Ht 66.75 in | Wt 250.8 lb

## 2020-01-23 DIAGNOSIS — Z20828 Contact with and (suspected) exposure to other viral communicable diseases: Secondary | ICD-10-CM

## 2020-01-23 DIAGNOSIS — Z01419 Encounter for gynecological examination (general) (routine) without abnormal findings: Secondary | ICD-10-CM

## 2020-01-23 DIAGNOSIS — E559 Vitamin D deficiency, unspecified: Secondary | ICD-10-CM

## 2020-01-23 DIAGNOSIS — N898 Other specified noninflammatory disorders of vagina: Secondary | ICD-10-CM

## 2020-01-23 NOTE — Telephone Encounter (Signed)
Patient was seen today and forgot to get her letter from [place] regarding hr emotional support dog. She also failed to have her bp checked again.

## 2020-01-23 NOTE — Progress Notes (Signed)
49 y.o. [id number] Divorced Black or [place] American female here for annual exam.  She reports she is doing well.  Had appt with Dr. Jana Hakim in August.    Denies vaginal bleeding.  Is having recurrent vaginal itching sensations.  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  Would like to have that repeated as she's never had typical HSV symptoms.    Off Tamoxifen in June.    Patient's last menstrual period was 08/31/2018 (approximate).          Sexually active: Yes.    The current method of family planning is status post hysterectomy.    Exercising: Yes.    3-4 days a week Smoker:  no  Health Maintenance: Pap:  08/17/18 Neg:Neg HR HPV  2017 Normal  History of abnormal Pap:  Yes, LEEP 1991 MMG:  05/09/19 BIRADS 2 benign/density b Colonoscopy:  Never.  D/w pt today. BMD:   never TDaP:  UTD per patient  Screening Labs: discuss today    reports that she has never smoked. She has never used smokeless tobacco. She reports current alcohol use of about 1.0 standard drinks of alcohol per week. She reports that she does not use drugs.  Past Medical History:  Diagnosis Date  . Allergy   . Anxiety   . Breast cancer ([place]) dx. 44 04/11/15   ILC of left breast  . Colon polyps   . Ectopic pregnancy   . Family history of breast cancer   . Personal history of radiation therapy     Past Surgical History:  Procedure Laterality Date  . BREAST BIOPSY Left 03/2015  . BREAST LUMPECTOMY Left 06/2015  . BREAST LUMPECTOMY WITH RADIOACTIVE SEED AND SENTINEL LYMPH NODE BIOPSY Left 06/22/2015   Procedure: LEFT BREAST LUMPECTOMY WITH RADIOACTIVE SEED AND SENTINEL LYMPH NODE BIOPSY;  Surgeon: Coralie Keens, MD;  Location: [facility];  Service: General;  Laterality: Left;  . CERVICAL BIOPSY  W/ LOOP ELECTRODE EXCISION  1991  . CESAREAN SECTION  2002  . CHOLECYSTECTOMY  1998  . CYSTOSCOPY N/A 11/29/2018   Procedure: CYSTOSCOPY;  Surgeon: Megan Salon, MD;  Location: [facility];  Service: Gynecology;  Laterality: N/A;  . LYMPHADENECTOMY  1992   single node in right neck  . SALPINGECTOMY Left 10/2005  . STRABISMUS SURGERY  2016  . TOTAL LAPAROSCOPIC HYSTERECTOMY WITH SALPINGECTOMY Bilateral 11/29/2018   Procedure: TOTAL LAPAROSCOPIC HYSTERECTOMY WITH SALPINGECTOMY;  Surgeon: Megan Salon, MD;  Location: [facility];  Service: Gynecology;  Laterality: Bilateral;  Possible BSO. Extended recovery bed needed    Current Outpatient Medications  Medication Sig Dispense Refill  . tamoxifen (NOLVADEX) 20 MG tablet Take 1 tablet (20 mg total) by mouth daily. 90 tablet 4  . ibuprofen (ADVIL,MOTRIN) 200 MG tablet Take 4 tablets (800 mg total) by mouth every 8 (eight) hours as needed for headache, mild pain, moderate pain or cramping (pain). (Patient not taking: Reported on 01/23/2020) 30 tablet 0   No current facility-administered medications for this visit.    Family History  Problem Relation Age of Onset  . Breast cancer Mother 53  . Breast cancer Sister 16  . Breast cancer Maternal Aunt 60       +calcifications  . Prostate cancer Maternal Uncle 67  . Heart attack Maternal Grandmother   . Heart attack Maternal Grandfather   . Prostate cancer Maternal Grandfather 71  . Diabetes Paternal Grandmother   . Pancreatitis  Paternal Grandfather   . Prostate cancer Cousin        dx. late 31s    Review of Systems  Genitourinary:       Vaginal itching  All other systems reviewed and are negative.   Exam:   BP (!) 150/88 (BP Location: Right Arm, Patient Position: Sitting, Cuff Size: Normal)   Pulse 68   Temp 97.7 F (36.5 C) (Temporal)   Resp 10   Ht 5' 6.75" (1.695 m)   Wt 250 lb 12.8 oz (113.8 kg)   LMP 08/31/2018 (Approximate)   BMI 39.58 kg/m     Height: 5' 6.75" (169.5 cm)  Ht Readings from Last 3 Encounters:  01/23/20 5' 6.75" (1.695 m)  07/13/19 5' 6.5" (1.689 m)  01/20/19 5' 6.5" (1.689 m)    General appearance: alert,  cooperative and appears stated age Head: Normocephalic, without obvious abnormality, atraumatic Neck: no adenopathy, supple, symmetrical, trachea midline and thyroid normal to inspection and palpation Lungs: clear to auscultation bilaterally Breasts: right breast without masses, skin changes, LAD; left breast with well healed breast scar and axillary scar with radiation changes, no masses, LAD, nipple discharge noted Heart: regular rate and rhythm Abdomen: soft, non-tender; bowel sounds normal; no masses,  no organomegaly Extremities: extremities normal, atraumatic, no cyanosis or edema Skin: Skin color, texture, turgor normal. No rashes or lesions Lymph nodes: Cervical, supraclavicular, and axillary nodes normal. No abnormal inguinal nodes palpated Neurologic: Grossly normal   Pelvic: External genitalia:  no lesions              Urethra:  normal appearing urethra with no masses, tenderness or lesions              Bartholins and Skenes: normal                 Vagina: normal appearing vagina with normal color and discharge, no lesions              Cervix: absent              Pap taken: No. Bimanual Exam:  Uterus:  uterus absent              Adnexa: no mass, fullness, tenderness               Rectovaginal: Confirms               Anus:  normal sphincter tone, no lesions  Chaperone, Terence Lux, CMA, was present for exam.  A:  Well Woman with normal exam H/o Stage IA, Grade I, ER/+/PR+ breast cancer s/p lumpectomy and SNB, then radiation.  No chemo.  On Tamoxifen x 5 years H/o cesarean section H/o left salpingectomy with ectopic pregnancy 12/06 Elevate BP today Mildly elevated creatinine  P:   Mammogram guidelines reviewed.  Doing diagnostic for 5 years post op pap smear not indicated HSV I/II IgG Colonoscopy screening discussed.  Cologuard discussed.  She will have this done.   Affirm vaginitis swab obtained today Vit D obtained today. return annually or prn

## 2020-01-23 NOTE — Patient Instructions (Addendum)
Covid vaccine appt:   [phone] (Option 2) from 8:00 am-5:00 pm  Head to [email or web address] to schedule your appointment indoors or in the drive-thru, or call the [facility] at [phone].

## 2020-01-23 NOTE — Addendum Note (Signed)
Addended by: Terence Lux A on: 01/23/2020 02:23 PM   Modules accepted: Orders

## 2020-01-24 LAB — VAGINITIS/VAGINOSIS, DNA PROBE
Candida Species: POSITIVE — AB
Gardnerella vaginalis: NEGATIVE
Trichomonas vaginosis: NEGATIVE

## 2020-01-24 LAB — HSV(HERPES SIMPLEX VRS) I + II AB-IGG
HSV 1 Glycoprotein G Ab, IgG: 1.99 index — ABNORMAL HIGH (ref 0.00–0.90)
HSV 2 IgG, Type Spec: 9.24 index — ABNORMAL HIGH (ref 0.00–0.90)

## 2020-01-24 LAB — VITAMIN D 25 HYDROXY (VIT D DEFICIENCY, FRACTURES): Vit D, 25-Hydroxy: 13.5 ng/mL — ABNORMAL LOW (ref 30.0–100.0)

## 2020-01-24 NOTE — Telephone Encounter (Signed)
Patient is returning call to [place], Therapist, sports.

## 2020-01-24 NOTE — Telephone Encounter (Signed)
Left message to call Sharee Pimple, RN at [place].

## 2020-01-24 NOTE — Telephone Encounter (Signed)
Spoke with patient.   1. B/P while in office 150/88, patient states she was going to have this recheck prior to leaving and forgot. Advised patient we can schedule nurse visit for B/P recheck. Patient is driving, will schedule when RN returns call to review results.   2. Patient is requesting results from 01/23/20 AEX, advised patient once Dr. Sabra Heck reviews results, our office will f/u to review results and recommendations, patient agreeable.  3. Patient is requesting a letter for emotional support animal for travel. Patient reports Hx of anxiety, no longer on xanax since getting her emotional support animal. Patient will pick up letter from office.   Advised patient I will forward to Dr. Sabra Heck for review, our office will f/u with recommendations. Patient agreeable.

## 2020-01-26 MED ORDER — VITAMIN D (ERGOCALCIFEROL) 1.25 MG (50000 UNIT) PO CAPS: 50000.0000 [IU] | ORAL_CAPSULE | ORAL | 0 refills | Status: DC

## 2020-01-26 MED ORDER — FLUCONAZOLE 150 MG PO TABS: ORAL_TABLET | ORAL | 0 refills | Status: DC

## 2020-01-26 NOTE — Telephone Encounter (Signed)
-----   Message from Megan Salon, MD sent at 01/25/2020  2:48 PM EST ----- Please let pt know vaginitis testing showed yeast. Ok to treat with diflucan 163m po x 1 and repeat in 72 hours if needed.  #2/0Rf.  It can treat with terazol 7 nightly for 7 nights.  If symptoms don't resolve, would recommend retesting.  Vit D was 15.  Needs 50 K vit D weekly for 12 weeks and then repeat Vit d level drawn.  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  She's never had an outbreak that I am aware of.  I would recommend considering skin swab testing when she has symptoms in the future.    FYI, I will write her support animal letter.

## 2020-01-26 NOTE — Telephone Encounter (Signed)
Spoke with patient, advised of all results as seen below per Dr. Sabra Heck. Rx for Vit D and diflucan to verified pharmacy. Patient read back Vit D instructions. Patient will schedule Vit D lab and nurse visit for B/P recheck when RN returns call to advise letter is ready for pick up. Patient verbalizes understanding and is agreeable.   Future lab order placed for Vit D.   Routing to Dr. Sabra Heck to advise when letter is ready.

## 2020-01-26 NOTE — Telephone Encounter (Signed)
Left message to call Sharee Pimple, RN at [place].

## 2020-01-26 NOTE — Telephone Encounter (Signed)
Patient returned call

## 2020-01-27 ENCOUNTER — Encounter: Payer: Self-pay | Admitting: Obstetrics & Gynecology

## 2020-01-27 NOTE — Telephone Encounter (Signed)
Letter has been written and send to you to print.  I can sign and we can send to pt or she can pick it up.  Thanks.

## 2020-01-27 NOTE — Telephone Encounter (Signed)
Letter printed and placed on Dr. Ammie Ferrier desk for signature.

## 2020-01-27 NOTE — Telephone Encounter (Signed)
Spoke with patient.   Nurse visit for B/P recheck scheduled for 3/16 at 8:45am. Patient will pick up letter at this time. Letter placed at front office for pick up.   Lab appt scheduled for 05/08/20 at 9:15am.   Patient verbalizes understanding and is agreeable.   Encounter closed.

## 2020-01-31 ENCOUNTER — Ambulatory Visit (INDEPENDENT_AMBULATORY_CARE_PROVIDER_SITE_OTHER): Payer: BC Managed Care – PPO | Admitting: Obstetrics & Gynecology

## 2020-01-31 ENCOUNTER — Other Ambulatory Visit: Payer: Self-pay

## 2020-01-31 VITALS — BP 120/75 | HR 75 | Temp 97.3°F | Ht 67.0 in | Wt 250.0 lb

## 2020-01-31 DIAGNOSIS — Z01419 Encounter for gynecological examination (general) (routine) without abnormal findings: Secondary | ICD-10-CM

## 2020-01-31 NOTE — Progress Notes (Signed)
Pt here for recheck on BP and picked up letter.   Reviewed with Dr Sabra Heck. Pt ok to leave.

## 2020-02-08 LAB — COLOGUARD: COLOGUARD: NEGATIVE

## 2020-02-09 ENCOUNTER — Ambulatory Visit: Payer: BC Managed Care – PPO | Attending: Internal Medicine

## 2020-02-09 DIAGNOSIS — Z23 Encounter for immunization: Secondary | ICD-10-CM

## 2020-02-09 NOTE — Progress Notes (Signed)
   Covid-19 Vaccination Clinic  Name:  ZIZA HASTINGS    MRN: [id number] DOB: Dec 19, 1970  02/09/2020  Ms. Ruhe was observed post Covid-19 immunization for 15 minutes without incident. She was provided with Vaccine Information Sheet and instruction to access the V-Safe system.   Ms. Hodgdon was instructed to call 911 with any severe reactions post vaccine: Marland Kitchen Difficulty breathing  . Swelling of face and throat  . A fast heartbeat  . A bad rash all over body  . Dizziness and weakness   Immunizations Administered    Name Date Dose VIS Date Route   Pfizer COVID-19 Vaccine 02/09/2020  1:42 PM 0.3 mL 10/28/2019 Intramuscular   Manufacturer: [place]   Lot: [id number]   [place]: [address]-7

## 2020-02-10 ENCOUNTER — Telehealth: Payer: Self-pay

## 2020-02-10 NOTE — Telephone Encounter (Signed)
cologuard result is negative.  Left message for callback.

## 2020-02-13 NOTE — Telephone Encounter (Signed)
Patient notified of results. See lab

## 2020-02-14 ENCOUNTER — Other Ambulatory Visit: Payer: Self-pay

## 2020-02-14 DIAGNOSIS — Z1212 Encounter for screening for malignant neoplasm of rectum: Secondary | ICD-10-CM

## 2020-02-14 DIAGNOSIS — Z1211 Encounter for screening for malignant neoplasm of colon: Secondary | ICD-10-CM

## 2020-02-14 LAB — COLOGUARD: Cologuard: NEGATIVE

## 2020-03-05 ENCOUNTER — Ambulatory Visit: Payer: BC Managed Care – PPO | Attending: Internal Medicine

## 2020-03-05 DIAGNOSIS — Z23 Encounter for immunization: Secondary | ICD-10-CM

## 2020-03-05 NOTE — Progress Notes (Signed)
   Covid-19 Vaccination Clinic  Name:  Rhonda Moss    MRN: [id number] DOB: [birth year]  03/05/2020  [name] was observed post Covid-19 immunization for 15 minutes without incident. She was provided with Vaccine Information Sheet and instruction to access the V-Safe system.   [name] was instructed to call 911 with any severe reactions post vaccine: Marland Kitchen Difficulty breathing  . Swelling of face and throat  . A fast heartbeat  . A bad rash all over body  . Dizziness and weakness   Immunizations Administered    Name Date Dose VIS Date Route   Pfizer COVID-19 Vaccine 03/05/2020 12:13 PM 0.3 mL 01/11/2019 Intramuscular   Manufacturer: [place]   Lot: [id number]   [place]: [id number]

## 2020-04-12 ENCOUNTER — Other Ambulatory Visit: Payer: Self-pay | Admitting: Obstetrics & Gynecology

## 2020-05-02 DIAGNOSIS — M25532 Pain in left wrist: Secondary | ICD-10-CM | POA: Diagnosis not present

## 2020-05-08 ENCOUNTER — Other Ambulatory Visit: Payer: Self-pay

## 2020-05-08 ENCOUNTER — Other Ambulatory Visit (INDEPENDENT_AMBULATORY_CARE_PROVIDER_SITE_OTHER): Payer: BC Managed Care – PPO

## 2020-05-08 DIAGNOSIS — E559 Vitamin D deficiency, unspecified: Secondary | ICD-10-CM

## 2020-05-09 ENCOUNTER — Other Ambulatory Visit: Payer: Self-pay | Admitting: Oncology

## 2020-05-09 DIAGNOSIS — Z853 Personal history of malignant neoplasm of breast: Secondary | ICD-10-CM

## 2020-05-09 LAB — VITAMIN D 25 HYDROXY (VIT D DEFICIENCY, FRACTURES): Vit D, 25-Hydroxy: 22.4 ng/mL — ABNORMAL LOW (ref 30.0–100.0)

## 2020-05-10 ENCOUNTER — Other Ambulatory Visit: Payer: Self-pay

## 2020-05-10 MED ORDER — VITAMIN D (ERGOCALCIFEROL) 1.25 MG (50000 UNIT) PO CAPS
50000.0000 [IU] | ORAL_CAPSULE | ORAL | 3 refills | Status: DC
Start: 2020-05-10 — End: 2020-07-05

## 2020-05-25 ENCOUNTER — Other Ambulatory Visit: Payer: Self-pay

## 2020-05-25 ENCOUNTER — Ambulatory Visit
Admission: RE | Admit: 2020-05-25 | Discharge: 2020-05-25 | Disposition: A | Discharge status: Home / Self Care | Payer: BC Managed Care – PPO | Source: Ambulatory Visit | Attending: Oncology | Admitting: Oncology

## 2020-05-25 DIAGNOSIS — Z853 Personal history of malignant neoplasm of breast: Secondary | ICD-10-CM | POA: Diagnosis not present

## 2020-05-25 DIAGNOSIS — R928 Other abnormal and inconclusive findings on diagnostic imaging of breast: Secondary | ICD-10-CM | POA: Diagnosis not present

## 2020-05-28 NOTE — Progress Notes (Signed)
[place]  [phone] [phone]     ID: Rhonda Moss DOB: [birth year]  MR#: [id number]  EHU#:[id number]  Patient Care Team: Patient, No Pcp Per as PCP - General (General Practice) Breia Ocampo, Virgie Dad, MD as Consulting Physician (Oncology) Coralie Keens, MD as Consulting Physician (General Surgery) Thea Silversmith, MD as Consulting Physician (Radiation Oncology) Sylvan Cheese, NP as Nurse Practitioner (Hematology and Oncology) PCP: Patient, No Pcp Per OTHER MD:    CHIEF COMPLAINT: Estrogen receptor positive left breast cancer  CURRENT TREATMENT:  Tamoxifen   BREAST CANCER HISTORY: From the original intake note:  The patient had routine bilateral screening mammography at the breast center 03/13/2015 showing a possible mass in the left breast. Left diagnostic mammography with tomosynthesis and left breast ultrasonography 04/04/2015 found the breast density to be category C area did in the left breast upper outer quadrant there was a persistent mass which by ultrasound was irregular and hypoechoic. It measured 0.7 cm. In the left axilla there was a normal size lymph node with eccentric cortical thickening measuring 4 mm in maximal thickness.  On 04/11/2015 the patient underwent biopsy of the left breast mass in question and this showed ([phone]) and invasive lobular carcinoma, E-cadherin negative, grade 1 or 2, estrogen receptor 99% positive, progesterone receptor 90% positive, both with strong staining intensity, with an MIB-1 of 19%. HER-2 was not amplified, the signals ratio being 1.09 and the number per cell 1.90.  The patient's case was presented at the multidisciplinary breast cancer conference 04/18/2015. At that time it was recommended that the patient be referred to genetics and undergo breast MRI.   Her subsequent history is as detailed below   INTERVAL HISTORY: Rhonda Moss returns today for follow-up and treatment of her  estrogen receptor positive left breast cancer. She was last seen here on 07/13/2019.   She went off tamoxifen as of April 17, 2020.  She has really not noticed any change since going off.  She thinks maybe she is a little moody like when she was having a cycle.  Of course recall she is status post hysterectomy.  Since her last visit here, she underwent a digital diagnostic bilateral mammogram with tomography on 05/25/2020 showing no mammographic evidence of malignancy.    REVIEW OF SYSTEMS: Zanita has been found to be low in vitamin D and is being closely followed for that by [place].  John does not have a primary care doctor but is interested in obtaining 1.  She is exercising about 3 times a week doing a total body workout through a trainer.  Aside from these issues a detailed review of systems today was stable   PAST MEDICAL HISTORY: Past Medical History:  Diagnosis Date  . Allergy   . Anxiety   . Breast cancer ([place]) dx. 49 04/11/15   ILC of left breast  . Ectopic pregnancy   . Family history of breast cancer   . Personal history of radiation therapy     PAST SURGICAL HISTORY: Past Surgical History:  Procedure Laterality Date  . BREAST BIOPSY Left 03/2015  . BREAST LUMPECTOMY Left 06/2015  . BREAST LUMPECTOMY WITH RADIOACTIVE SEED AND SENTINEL LYMPH NODE BIOPSY Left 06/22/2015   Procedure: LEFT BREAST LUMPECTOMY WITH RADIOACTIVE SEED AND SENTINEL LYMPH NODE BIOPSY;  Surgeon: Coralie Keens, MD;  Location: [facility];  Service: General;  Laterality: Left;  . CERVICAL BIOPSY  W/ LOOP ELECTRODE EXCISION  1991  . CESAREAN SECTION  2002  .  CHOLECYSTECTOMY  1998  . CYSTOSCOPY N/A 11/29/2018   Procedure: CYSTOSCOPY;  Surgeon: Megan Salon, MD;  Location: [facility];  Service: Gynecology;  Laterality: N/A;  . LYMPHADENECTOMY  1992   single node in right neck  . SALPINGECTOMY Left 10/2005  . STRABISMUS SURGERY  2016  . TOTAL LAPAROSCOPIC HYSTERECTOMY WITH  SALPINGECTOMY Bilateral 11/29/2018   Procedure: TOTAL LAPAROSCOPIC HYSTERECTOMY WITH SALPINGECTOMY;  Surgeon: Megan Salon, MD;  Location: [facility];  Service: Gynecology;  Laterality: Bilateral;  Possible BSO. Extended recovery bed needed    FAMILY HISTORY Family History  Problem Relation Age of Onset  . Breast cancer Mother 63  . Breast cancer Sister 3  . Breast cancer Maternal Aunt 60       +calcifications  . Prostate cancer Maternal Uncle 67  . Heart attack Maternal Grandmother   . Heart attack Maternal Grandfather   . Prostate cancer Maternal Grandfather 82  . Diabetes Paternal Grandmother   . Pancreatitis Paternal Grandfather   . Prostate cancer Cousin        dx. late 68s  the patient's parents are living, in their late 58s. The patient has one brother, and one sister. The patient's mother was diagnosed with breast cancer at age 40 and  One of the patient's mother's 2 sisters was also diagnosed with breast cancer at age 63. The patient's on sister was diagnosed with breast cancer at age 72. She was tested for the BRCA gene mutations and was negative.  GYNECOLOGIC HISTORY:  Patient's last menstrual period was 08/31/2018 (approximate). Menarche age 41 first live birth age 49. The patient is GX P2. Her periods are regular, lasting between 5 and 7 days. They're now slightly better controlled on a NuvaRing IUD.   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 Her job involves a great deal of computer work and some driving, occasionally some walking. In October 2017 she married Oley Balm, who works for [employer].  Her son Balducci, currently 38 years old, is currently at [employer] on the baseball team and studying criminal justice. The patient's daughter [name], currently 69, works in a physical therapy department and is studying to get a physical therapy assistant license.    ADVANCED DIRECTIVES: Not in place. At the  initial clinic visit 05/29/2020 the patient was given the appropriate forms to complete and notarize at her discretion.  HEALTH MAINTENANCE: Social History   Tobacco Use  . Smoking status: Never Smoker  . Smokeless tobacco: Never Used  Vaping Use  . Vaping Use: Never used  Substance Use Topics  . Alcohol use: Yes    Alcohol/week: 1.0 standard drink    Types: 1 Standard drinks or equivalent per week    Comment: social  . Drug use: No      Allergies  Allergen Reactions  . Sulfa Antibiotics Hives    Current Outpatient Medications  Medication Sig Dispense Refill  . fluconazole (DIFLUCAN) 150 MG tablet Take one tablet PO now, may repeat in 72 hours if symptoms develop. 2 tablet 0  . ibuprofen (ADVIL,MOTRIN) 200 MG tablet Take 4 tablets (800 mg total) by mouth every 8 (eight) hours as needed for headache, mild pain, moderate pain or cramping (pain). (Patient not taking: Reported on 01/23/2020) 30 tablet 0  . tamoxifen (NOLVADEX) 20 MG tablet Take 1 tablet (20 mg total) by mouth daily. 90 tablet 4  . Vitamin D, Ergocalciferol, (DRISDOL) 1.25 MG (50000 UNIT) CAPS  capsule Take 1 capsule (50,000 Units total) by mouth every 7 (seven) days. 12 capsule 0  . Vitamin D, Ergocalciferol, (DRISDOL) 1.25 MG (50000 UNIT) CAPS capsule Take 1 capsule (50,000 Units total) by mouth every 7 (seven) days. 12 capsule 3   No current facility-administered medications for this visit.    OBJECTIVE: African-American woman in no acute distress Vitals:   05/29/20 0935  BP: (!) 142/84  Pulse: (!) 59  Resp: 20  Temp: 98 F (36.7 C)  SpO2: 100%   Wt Readings from Last 3 Encounters:  05/29/20 244 lb 11.2 oz (111 kg)  01/31/20 250 lb (113.4 kg)  01/23/20 250 lb 12.8 oz (113.8 kg)   Body mass index is 38.33 kg/m.    ECOG FS:1 - Symptomatic but completely ambulatory  Ocular: Sclerae unicteric, pupils round and equal Ear-nose-throat: Wearing a mask Lymphatic: No cervical or supraclavicular  adenopathy Lungs no rales or rhonchi Heart regular rate and rhythm Abd soft, nontender, positive bowel sounds MSK no focal spinal tenderness, no joint edema Neuro: non-focal, well-oriented, appropriate affect Breasts: The right breast is unremarkable.  The left breast is status post lumpectomy and radiation.  There is no evidence of local recurrence.  Both axillae are benign.  LAB RESULTS:  CMP     Component Value Date/Time   NA 140 05/29/2020 0911   NA 140 06/24/2017 1130   K 4.5 05/29/2020 0911   K 4.5 06/24/2017 1130   CL 107 05/29/2020 0911   CO2 25 05/29/2020 0911   CO2 27 06/24/2017 1130   GLUCOSE 92 05/29/2020 0911   GLUCOSE 90 06/24/2017 1130   BUN 17 05/29/2020 0911   BUN 13.2 06/24/2017 1130   CREATININE 0.99 05/29/2020 0911   CREATININE 1.1 06/24/2017 1130   CALCIUM 9.0 05/29/2020 0911   CALCIUM 9.6 06/24/2017 1130   PROT 6.8 05/29/2020 0911   PROT 7.5 06/24/2017 1130   ALBUMIN 3.4 (L) 05/29/2020 0911   ALBUMIN 3.5 06/24/2017 1130   AST 22 05/29/2020 0911   AST 25 06/24/2017 1130   ALT 20 05/29/2020 0911   ALT 19 06/24/2017 1130   ALKPHOS 64 05/29/2020 0911   ALKPHOS 69 06/24/2017 1130   BILITOT 0.8 05/29/2020 0911   BILITOT 0.70 06/24/2017 1130   GFRNONAA >60 05/29/2020 0911   GFRAA >60 05/29/2020 0911    INo results found for: SPEP, UPEP  Lab Results  Component Value Date   WBC 3.5 (L) 05/29/2020   NEUTROABS 1.4 (L) 05/29/2020   HGB 11.8 (L) 05/29/2020   HCT 35.8 (L) 05/29/2020   MCV 97.3 05/29/2020   PLT 188 05/29/2020      Chemistry      Component Value Date/Time   NA 140 05/29/2020 0911   NA 140 06/24/2017 1130   K 4.5 05/29/2020 0911   K 4.5 06/24/2017 1130   CL 107 05/29/2020 0911   CO2 25 05/29/2020 0911   CO2 27 06/24/2017 1130   BUN 17 05/29/2020 0911   BUN 13.2 06/24/2017 1130   CREATININE 0.99 05/29/2020 0911   CREATININE 1.1 06/24/2017 1130      Component Value Date/Time   CALCIUM 9.0 05/29/2020 0911   CALCIUM 9.6  06/24/2017 1130   ALKPHOS 64 05/29/2020 0911   ALKPHOS 69 06/24/2017 1130   AST 22 05/29/2020 0911   AST 25 06/24/2017 1130   ALT 20 05/29/2020 0911   ALT 19 06/24/2017 1130   BILITOT 0.8 05/29/2020 0911   BILITOT 0.70 06/24/2017 1130  No results found for: LABCA2  No components found for: [id number]  No results for input(s): INR in the last 168 hours.  Urinalysis    Component Value Date/Time   COLORURINE YELLOW 12/03/2014 1415   APPEARANCEUR CLEAR 12/03/2014 1415   LABSPEC 1.024 12/03/2014 1415   PHURINE 5.5 12/03/2014 1415   GLUCOSEU NEGATIVE 12/03/2014 1415   HGBUR NEGATIVE 12/03/2014 1415   BILIRUBINUR N 01/20/2019 0951   KETONESUR 15 (A) 12/03/2014 1415   PROTEINUR Negative 01/20/2019 0951   PROTEINUR NEGATIVE 12/03/2014 1415   UROBILINOGEN 0.2 01/20/2019 0951   UROBILINOGEN 0.2 12/03/2014 1415   NITRITE N 01/20/2019 0951   NITRITE NEGATIVE 12/03/2014 1415   LEUKOCYTESUR Negative 01/20/2019 0951    STUDIES: MM DIAG BREAST TOMO BILATERAL  Result Date: 05/25/2020 CLINICAL DATA:  49 year old female status post malignant left lumpectomy with radiation therapy in 2016. No current breast related symptoms. EXAM: DIGITAL DIAGNOSTIC BILATERAL MAMMOGRAM WITH TOMO AND CAD COMPARISON:  Previous exam(s). ACR Breast Density Category b: There are scattered areas of fibroglandular density. FINDINGS: Stable post lumpectomy changes are noted in the far posterior upper outer left breast. No new or suspicious findings are identified in either breast. The parenchymal pattern is stable. Mammographic images were processed with CAD. IMPRESSION: 1. No mammographic evidence of malignancy in either breast. 2. Stable left breast posttreatment changes. RECOMMENDATION: Screening mammogram in one year.(Code:[id number]-B-01Y) I have discussed the findings and recommendations with the patient. If applicable, a reminder letter will be sent to the patient regarding the next appointment. BI-RADS CATEGORY  2:  Benign. Electronically Signed   By: Kristopher Oppenheim M.D.   On: 05/25/2020 13:55     ASSESSMENT: 49 y.[place]. [place] status post left breast upper outer quadrant biopsy 04/11/2015 for a clinical T1b NX invasive lobular breast cancer, grade 1 or 2, strongly estrogen and progesterone receptor positive, HER-2 not amplified, with an MIB-1 of 19%.  (1) genetics testing 05/04/2015 through the [place] panel/ [facility] showed no deleterious mutations in ATM, BARD1, BRCA1, BRCA2, BRIP1, CDH1, CHEK2, FANCC, MLH1, MSH2, MSH6, NBN, PALB2, PMS2, PTEN, RAD51C, RAD51D, STK11, TP53, and XRCC2. Furthermore, deletion/duplication analysis (without next-generation sequencing) was performed for the EPCAM gene.   (2) status post left lumpectomy and sentinel lymph node sampling 06/22/2015 for a pT1b pN0, stage iA invasive lobular breast cancer, grade 1, repeat HER-2 again negative, with ample margins   (3)  Continuing adjuvant tamoxifen, started neoadjuvantly June 2016  (a) completed five years June 20201  (4) Oncotype DX score of 22 predicts a risk of recurrence outside the breast within the next 10 years of 14% if the patient's only systemic treatment is tamoxifen for 5 years.   (a)  The patient opted to forego chemotherapy given the very marginal anticipated benefit  (5)  Adjuvant radiation 08/13/15-09/25/15:  Left breast/ 45 Gy at 1.8 Gy per fraction x 25 fractions.  Left breast boost/ 14 Gy at 2 Gy per fraction x 8 fractions  (6) status post hysterectomy and left salpingo-oophorectomy (right ovary still in place) 11/29/2018 with benign pathology  PLAN: Krista is now just about 5 years out from definitive surgery for her breast cancer with no evidence of disease recurrence.  This is very favorable.  She has completed 5 years of tamoxifen.  I am comfortable with her stopping at this point.  She understands she has a very good prognosis going forward  She does need a primary  care physician.  I gave her the name of  2 who work at the [facility] that would be the most easily accessible for her.  She will let me know if she has any problem getting established to there  Otherwise as far as her breast cancer is concerned all she will need is a yearly physician breast exam and yearly mammography  I will be glad to see Timmothy Sours again at any point in the future if and when the need arises but as of now are making no further routine appointments for her here  Total encounter time 25 minutes.*  Frederika Hukill, Virgie Dad, MD  05/29/20 10:11 AM Medical Oncology and Hematology [facility] [address] [place], [place] [address] Tel. [phone]    Fax. [phone]  I, Jacqualyn Posey am acting as a Education administrator for Chauncey Cruel, MD.   I, Lurline Del MD, have reviewed the above documentation for accuracy and completeness, and I agree with the above.    *Total Encounter Time as defined by the Centers for Medicare and Medicaid Services includes, in addition to the face-to-face time of a patient visit (documented in the note above) non-face-to-face time: obtaining and reviewing outside history, ordering and reviewing medications, tests or procedures, care coordination (communications with other health care professionals or caregivers) and documentation in the medical record.

## 2020-05-29 ENCOUNTER — Other Ambulatory Visit: Payer: Self-pay

## 2020-05-29 ENCOUNTER — Inpatient Hospital Stay: Payer: BC Managed Care – PPO

## 2020-05-29 ENCOUNTER — Inpatient Hospital Stay: Payer: BC Managed Care – PPO | Attending: Oncology | Admitting: Oncology

## 2020-05-29 VITALS — BP 142/84 | HR 59 | Temp 98.0°F | Resp 20 | Ht 67.0 in | Wt 244.7 lb

## 2020-05-29 DIAGNOSIS — Z8759 Personal history of other complications of pregnancy, childbirth and the puerperium: Secondary | ICD-10-CM | POA: Diagnosis not present

## 2020-05-29 DIAGNOSIS — Z8249 Family history of ischemic heart disease and other diseases of the circulatory system: Secondary | ICD-10-CM | POA: Diagnosis not present

## 2020-05-29 DIAGNOSIS — Z6841 Body Mass Index (BMI) 40.0 and over, adult: Secondary | ICD-10-CM | POA: Diagnosis not present

## 2020-05-29 DIAGNOSIS — Z170 Estrogen receptor positive status [ER+]: Secondary | ICD-10-CM

## 2020-05-29 DIAGNOSIS — I2510 Atherosclerotic heart disease of native coronary artery without angina pectoris: Secondary | ICD-10-CM | POA: Insufficient documentation

## 2020-05-29 DIAGNOSIS — Z803 Family history of malignant neoplasm of breast: Secondary | ICD-10-CM | POA: Insufficient documentation

## 2020-05-29 DIAGNOSIS — Z833 Family history of diabetes mellitus: Secondary | ICD-10-CM | POA: Diagnosis not present

## 2020-05-29 DIAGNOSIS — Z882 Allergy status to sulfonamides status: Secondary | ICD-10-CM | POA: Insufficient documentation

## 2020-05-29 DIAGNOSIS — Z79899 Other long term (current) drug therapy: Secondary | ICD-10-CM | POA: Insufficient documentation

## 2020-05-29 DIAGNOSIS — C50412 Malignant neoplasm of upper-outer quadrant of left female breast: Secondary | ICD-10-CM | POA: Diagnosis not present

## 2020-05-29 DIAGNOSIS — Z923 Personal history of irradiation: Secondary | ICD-10-CM | POA: Insufficient documentation

## 2020-05-29 DIAGNOSIS — Z8379 Family history of other diseases of the digestive system: Secondary | ICD-10-CM | POA: Diagnosis not present

## 2020-05-29 DIAGNOSIS — Z8042 Family history of malignant neoplasm of prostate: Secondary | ICD-10-CM | POA: Diagnosis not present

## 2020-05-29 DIAGNOSIS — Z9079 Acquired absence of other genital organ(s): Secondary | ICD-10-CM | POA: Insufficient documentation

## 2020-05-29 DIAGNOSIS — Z90710 Acquired absence of both cervix and uterus: Secondary | ICD-10-CM | POA: Insufficient documentation

## 2020-05-29 LAB — CBC WITH DIFFERENTIAL/PLATELET
Abs Immature Granulocytes: 0.01 10*3/uL (ref 0.00–0.07)
Basophils Absolute: 0 10*3/uL (ref 0.0–0.1)
Basophils Relative: 0 %
Eosinophils Absolute: 0.1 10*3/uL (ref 0.0–0.5)
Eosinophils Relative: 2 %
HCT: 35.8 % — ABNORMAL LOW (ref 36.0–46.0)
Hemoglobin: 11.8 g/dL — ABNORMAL LOW (ref 12.0–15.0)
Immature Granulocytes: 0 %
Lymphocytes Relative: 49 %
Lymphs Abs: 1.7 10*3/uL (ref 0.7–4.0)
MCH: 32.1 pg (ref 26.0–34.0)
MCHC: 33 g/dL (ref 30.0–36.0)
MCV: 97.3 fL (ref 80.0–100.0)
Monocytes Absolute: 0.3 10*3/uL (ref 0.1–1.0)
Monocytes Relative: 9 %
Neutro Abs: 1.4 10*3/uL — ABNORMAL LOW (ref 1.7–7.7)
Neutrophils Relative %: 40 %
Platelets: 188 10*3/uL (ref 150–400)
RBC: 3.68 MIL/uL — ABNORMAL LOW (ref 3.87–5.11)
RDW: 12.6 % (ref 11.5–15.5)
WBC: 3.5 10*3/uL — ABNORMAL LOW (ref 4.0–10.5)
nRBC: 0 % (ref 0.0–0.2)

## 2020-05-29 LAB — COMPREHENSIVE METABOLIC PANEL
ALT: 20 U/L (ref 0–44)
AST: 22 U/L (ref 15–41)
Albumin: 3.4 g/dL — ABNORMAL LOW (ref 3.5–5.0)
Alkaline Phosphatase: 64 U/L (ref 38–126)
Anion gap: 8 (ref 5–15)
BUN: 17 mg/dL (ref 6–20)
CO2: 25 mmol/L (ref 22–32)
Calcium: 9 mg/dL (ref 8.9–10.3)
Chloride: 107 mmol/L (ref 98–111)
Creatinine, Ser: 0.99 mg/dL (ref 0.44–1.00)
GFR calc Af Amer: >60 mL/min (ref >60)
GFR calc non Af Amer: >60 mL/min (ref >60)
Glucose, Bld: 92 mg/dL (ref 70–99)
Potassium: 4.5 mmol/L (ref 3.5–5.1)
Sodium: 140 mmol/L (ref 135–145)
Total Bilirubin: 0.8 mg/dL (ref 0.3–1.2)
Total Protein: 6.8 g/dL (ref 6.5–8.1)

## 2020-05-29 LAB — RETICULOCYTES
Immature Retic Fract: 9.2 % (ref 2.3–15.9)
RBC.: 3.69 MIL/uL — ABNORMAL LOW (ref 3.87–5.11)
Retic Count, Absolute: 48 10*3/uL (ref 19.0–186.0)
Retic Ct Pct: 1.3 % (ref 0.4–3.1)

## 2020-05-29 LAB — FERRITIN: Ferritin: 267 ng/mL (ref 11–307)

## 2020-05-29 LAB — IRON AND TIBC
Iron: 109 ug/dL (ref 41–142)
Saturation Ratios: 40 % (ref 21–57)
TIBC: 269 ug/dL (ref 236–444)
UIBC: 160 ug/dL (ref 120–384)

## 2020-05-30 ENCOUNTER — Telehealth: Payer: Self-pay | Admitting: Oncology

## 2020-05-30 NOTE — Telephone Encounter (Signed)
No 7/13 los, no changes made to pt schedule

## 2020-07-05 ENCOUNTER — Other Ambulatory Visit: Payer: Self-pay

## 2020-07-05 ENCOUNTER — Ambulatory Visit (INDEPENDENT_AMBULATORY_CARE_PROVIDER_SITE_OTHER): Payer: BC Managed Care – PPO | Admitting: Family Medicine

## 2020-07-05 ENCOUNTER — Encounter: Payer: Self-pay | Admitting: Family Medicine

## 2020-07-05 VITALS — BP 138/82 | HR 79 | Temp 98.0°F | Ht 67.0 in | Wt 248.8 lb

## 2020-07-05 DIAGNOSIS — C50412 Malignant neoplasm of upper-outer quadrant of left female breast: Secondary | ICD-10-CM

## 2020-07-05 DIAGNOSIS — Z170 Estrogen receptor positive status [ER+]: Secondary | ICD-10-CM

## 2020-07-05 DIAGNOSIS — M778 Other enthesopathies, not elsewhere classified: Secondary | ICD-10-CM

## 2020-07-05 DIAGNOSIS — Z114 Encounter for screening for human immunodeficiency virus [HIV]: Secondary | ICD-10-CM | POA: Diagnosis not present

## 2020-07-05 DIAGNOSIS — Z0000 Encounter for general adult medical examination without abnormal findings: Secondary | ICD-10-CM | POA: Diagnosis not present

## 2020-07-05 LAB — LIPID PANEL
Cholesterol: 193 mg/dL (ref 0–200)
HDL: 65.9 mg/dL (ref >39.00)
LDL Cholesterol: 112 mg/dL — ABNORMAL HIGH (ref 0–99)
NonHDL: 126.61
Total CHOL/HDL Ratio: 3
Triglycerides: 73 mg/dL (ref 0.0–149.0)
VLDL: 14.6 mg/dL (ref 0.0–40.0)

## 2020-07-05 LAB — VITAMIN D 25 HYDROXY (VIT D DEFICIENCY, FRACTURES): VITD: 36.43 ng/mL (ref 30.00–100.00)

## 2020-07-05 NOTE — Patient Instructions (Addendum)
Vasovagal episode/syncope  Wear brace 2-4 wks  Take aleve 2 tabs twice per day with food x 7-10 days Ice pack 2x/day 10-15 min

## 2020-07-05 NOTE — Progress Notes (Signed)
Rhonda Moss is a 49 y.o. female  Chief Complaint  Patient presents with  . Establish Care    New Pt-pt had coffee but non diary cream and splenda//HIV ok    HPI: Rhonda Moss is a 49 y.o. female here as a new patient for CPE, fasting labs. She had some labs in 05/2020 - CBC, CMP  Last PAP: follows with Dr. Hale Bogus, s/p TAH Last mammo: UTD, h/o breast cancer, follows with Dr. Jana Hakim Last colonoscopy: cologuard in 01/2020 - negative  Dental: UTD - Dr. Marina Gravel Vision: UTD - Dr. Frederico Hamman Exercise - 3-4 days/wk including cardio  Med refills needed today? no  Pt complains of Rt wrist pain since 03/2020, seen at [facility] a few weeks ago, given brace and prednisone taper. Improved but still has pain at times. No decreased grip strength.  She did not wear brace and did not finish prednisone.  Rt handed.   Past Medical History:  Diagnosis Date  . Allergy   . Anxiety   . Breast cancer ([place]) dx. 44 04/11/15   ILC of left breast  . Ectopic pregnancy   . Family history of breast cancer   . Personal history of radiation therapy     Past Surgical History:  Procedure Laterality Date  . BREAST BIOPSY Left 03/2015  . BREAST LUMPECTOMY Left 06/2015  . BREAST LUMPECTOMY WITH RADIOACTIVE SEED AND SENTINEL LYMPH NODE BIOPSY Left 06/22/2015   Procedure: LEFT BREAST LUMPECTOMY WITH RADIOACTIVE SEED AND SENTINEL LYMPH NODE BIOPSY;  Surgeon: Coralie Keens, MD;  Location: [facility];  Service: General;  Laterality: Left;  . CERVICAL BIOPSY  W/ LOOP ELECTRODE EXCISION  1991  . CESAREAN SECTION  2002  . CHOLECYSTECTOMY  1998  . CYSTOSCOPY N/A 11/29/2018   Procedure: CYSTOSCOPY;  Surgeon: Megan Salon, MD;  Location: [facility];  Service: Gynecology;  Laterality: N/A;  . LYMPHADENECTOMY  1992   single node in right neck  . SALPINGECTOMY Left 10/2005  . STRABISMUS SURGERY  2016  . TOTAL LAPAROSCOPIC HYSTERECTOMY WITH SALPINGECTOMY Bilateral 11/29/2018    Procedure: TOTAL LAPAROSCOPIC HYSTERECTOMY WITH SALPINGECTOMY;  Surgeon: Megan Salon, MD;  Location[facility];  Service: Gynecology;  Laterality: Bilateral;  Possible BSO. Extended recovery bed needed    Social History   Socioeconomic History  . Marital status: Divorced    Spouse name: Not on file  . Number of children: 2  . Years of education: Not on file  . Highest education level: Not on file  Occupational History  . Not on file  Tobacco Use  . Smoking status: Never Smoker  . Smokeless tobacco: Never Used  Vaping Use  . Vaping Use: Never used  Substance and Sexual Activity  . Alcohol use: Yes    Alcohol/week: 1.0 standard drink    Types: 1 Standard drinks or equivalent per week    Comment: social  . Drug use: No  . Sexual activity: Yes    Birth control/protection: None  Other Topics Concern  . Not on file  Social History Narrative  . Not on file   Social Determinants of Health   Financial Resource Strain:   . Difficulty of Paying Living Expenses: Not on file  Food Insecurity:   . Worried About Charity fundraiser in the Last Year: Not on file  . Ran Out of Food in the Last Year: Not on file  Transportation Needs:   . Lack of Transportation (  Medical): Not on file  . Lack of Transportation (Non-Medical): Not on file  Physical Activity:   . Days of Exercise per Week: Not on file  . Minutes of Exercise per Session: Not on file  Stress:   . Feeling of Stress : Not on file  Social Connections:   . Frequency of Communication with Friends and Family: Not on file  . Frequency of Social Gatherings with Friends and Family: Not on file  . Attends Religious Services: Not on file  . Active Member of Clubs or Organizations: Not on file  . Attends Archivist Meetings: Not on file  . Marital Status: Not on file  Intimate Partner Violence:   . Fear of Current or Ex-Partner: Not on file  . Emotionally Abused: Not on file  . Physically Abused: Not  on file  . Sexually Abused: Not on file    Family History  Problem Relation Age of Onset  . Breast cancer Mother 71  . Breast cancer Sister 74  . Breast cancer Maternal Aunt 60       +calcifications  . Prostate cancer Maternal Uncle 67  . Heart attack Maternal Grandmother   . Heart attack Maternal Grandfather   . Prostate cancer Maternal Grandfather 39  . Diabetes Paternal Grandmother   . Pancreatitis Paternal Grandfather   . Prostate cancer Cousin        dx. late 40s     Immunization History  Administered Date(s) Administered  . PFIZER SARS-COV-2 Vaccination 02/09/2020, 03/05/2020    Outpatient Encounter Medications as of 07/05/2020  Medication Sig  . ibuprofen (ADVIL,MOTRIN) 200 MG tablet Take 4 tablets (800 mg total) by mouth every 8 (eight) hours as needed for headache, mild pain, moderate pain or cramping (pain).  . Vitamin D, Ergocalciferol, (DRISDOL) 1.25 MG (50000 UNIT) CAPS capsule Take 1 capsule (50,000 Units total) by mouth every 7 (seven) days.  . fluconazole (DIFLUCAN) 150 MG tablet Take one tablet PO now, may repeat in 72 hours if symptoms develop. (Patient not taking: Reported on 07/05/2020)  . tamoxifen (NOLVADEX) 20 MG tablet Take 1 tablet (20 mg total) by mouth daily. (Patient not taking: Reported on 07/05/2020)  . Vitamin D, Ergocalciferol, (DRISDOL) 1.25 MG (50000 UNIT) CAPS capsule Take 1 capsule (50,000 Units total) by mouth every 7 (seven) days. (Patient not taking: Reported on 07/05/2020)   No facility-administered encounter medications on file as of 07/05/2020.     ROS: Gen: no fever, chills  Skin: no rash, itching ENT: no ear pain, ear drainage, nasal congestion, rhinorrhea, sinus pressure, sore throat Eyes: no blurry vision, double vision Resp: no cough, wheeze,SOB CV: no CP, palpitations, + occasional LE edema when seated for long period of time GI: no heartburn, n/v/d/c, abd pain GU: no dysuria, urgency, frequency, hematuria MSK: no joint pain,  myalgias, back pain; Rt thumb tendonitis Neuro: no dizziness, headache, weakness Psych: no depression, anxiety, insomnia   Allergies  Allergen Reactions  . Sulfa Antibiotics Hives    BP 138/82   Pulse 79   Temp 98 F (36.7 C) (Tympanic)   Ht 5' 7"  (1.702 m)   Wt 248 lb 12.8 oz (112.9 kg)   LMP 08/31/2018 (Approximate)   SpO2 98%   BMI 38.97 kg/m   Physical Exam Constitutional:      General: She is not in acute distress.    Appearance: She is well-developed.  HENT:     Head: Normocephalic and atraumatic.     Right Ear: Tympanic membrane  and ear canal normal.     Left Ear: Tympanic membrane and ear canal normal.     Nose: Nose normal.  Eyes:     Conjunctiva/sclera: Conjunctivae normal.     Pupils: Pupils are equal, round, and reactive to light.  Neck:     Thyroid: No thyromegaly.  Cardiovascular:     Rate and Rhythm: Normal rate and regular rhythm.     Heart sounds: Normal heart sounds. No murmur heard.   Pulmonary:     Effort: Pulmonary effort is normal. No respiratory distress.     Breath sounds: Normal breath sounds. No wheezing or rhonchi.  Abdominal:     General: Bowel sounds are normal. There is no distension.     Palpations: Abdomen is soft. There is no mass.     Tenderness: There is no abdominal tenderness.  Musculoskeletal:     Cervical back: Neck supple.  Lymphadenopathy:     Cervical: No cervical adenopathy.  Skin:    General: Skin is warm and dry.  Neurological:     Mental Status: She is alert and oriented to person, place, and time.     Motor: No abnormal muscle tone.     Coordination: Coordination normal.  Psychiatric:        Behavior: Behavior normal.      A/P:  1. Annual physical exam - discussed importance of regular CV exercise, healthy diet, adequate sleep - UTD on dental and vision exams - UTD on mammo, colon cancer screening; pt is s/p TAH and follows with GYN - VITAMIN D 25 Hydroxy (Vit-D Deficiency, Fractures) - Lipid  panel  2. Malignant neoplasm of upper-outer quadrant of left breast in female, estrogen receptor positive ([place]) - recently discharged from oncology Dr. Jana Hakim, done with tamozifen  3. Screening for HIV (human immunodeficiency virus) - HIV Antibody (routine testing w rflx)  4. Thumb tendonitis - symptoms since 03/2020 - seen at [facility] but pt did not wear brace consistently or complete prednisone taper - symptoms improve but still present - advised pt use ice pack 2-3x/day, take naprosyn 459m BID with food x 7-10 days (pt prefers to take OTC aleve since she has it at home) - wear brace daily x 2-4 wks - f/u in 4 wks if symptoms worsen or do not improve   This visit occurred during the SARS-CoV-2 public health emergency.  Safety protocols were in place, including screening questions prior to the visit, additional usage of staff PPE, and extensive cleaning of exam room while observing appropriate contact time as indicated for disinfecting solutions.

## 2020-07-06 LAB — HIV ANTIBODY (ROUTINE TESTING W REFLEX): HIV 1&2 Ab, 4th Generation: NONREACTIVE

## 2020-07-13 ENCOUNTER — Telehealth: Payer: Self-pay | Admitting: Family Medicine

## 2020-07-13 NOTE — Telephone Encounter (Signed)
Patient is calling in regards to her lab results. Please give her a call back at [phone].

## 2020-07-13 NOTE — Telephone Encounter (Signed)
Pt should take OTC Vit D supplement 1000-2000IU daily

## 2020-07-13 NOTE — Telephone Encounter (Signed)
Pt was notified and verbally understood lab results.  Dr. Loletha Grayer please advise.  Pt was asking if she should continue her vitamin D since the lab results showed it was good.

## 2020-07-16 NOTE — Telephone Encounter (Signed)
Pt was notified and verbally understood to take the OTC vit D supplement and stated she would call an let Dr. Ammie Ferrier office aware as well.

## 2020-11-18 DIAGNOSIS — Z20822 Contact with and (suspected) exposure to covid-19: Secondary | ICD-10-CM | POA: Diagnosis not present

## 2021-04-19 ENCOUNTER — Other Ambulatory Visit: Payer: Self-pay | Admitting: Oncology

## 2021-04-19 DIAGNOSIS — Z1231 Encounter for screening mammogram for malignant neoplasm of breast: Secondary | ICD-10-CM

## 2021-04-23 ENCOUNTER — Ambulatory Visit: Payer: BC Managed Care – PPO

## 2021-05-10 DIAGNOSIS — H538 Other visual disturbances: Secondary | ICD-10-CM | POA: Diagnosis not present

## 2021-06-14 ENCOUNTER — Ambulatory Visit
Admission: RE | Admit: 2021-06-14 | Discharge: 2021-06-14 | Disposition: A | Discharge status: Home / Self Care | Payer: Medicaid Other | Source: Ambulatory Visit | Attending: Oncology | Admitting: Oncology

## 2021-06-14 ENCOUNTER — Other Ambulatory Visit: Payer: Self-pay

## 2021-06-14 DIAGNOSIS — Z1231 Encounter for screening mammogram for malignant neoplasm of breast: Secondary | ICD-10-CM

## 2022-03-13 ENCOUNTER — Ambulatory Visit (INDEPENDENT_AMBULATORY_CARE_PROVIDER_SITE_OTHER): Payer: Medicaid Other | Admitting: Family Medicine

## 2022-03-13 ENCOUNTER — Encounter: Payer: Self-pay | Admitting: Family Medicine

## 2022-03-13 VITALS — BP 110/78 | HR 66 | Temp 97.0°F | Ht 66.5 in | Wt 222.6 lb

## 2022-03-13 DIAGNOSIS — D500 Iron deficiency anemia secondary to blood loss (chronic): Secondary | ICD-10-CM

## 2022-03-13 DIAGNOSIS — D649 Anemia, unspecified: Secondary | ICD-10-CM

## 2022-03-13 DIAGNOSIS — Z853 Personal history of malignant neoplasm of breast: Secondary | ICD-10-CM

## 2022-03-13 DIAGNOSIS — E559 Vitamin D deficiency, unspecified: Secondary | ICD-10-CM

## 2022-03-13 DIAGNOSIS — L987 Excessive and redundant skin and subcutaneous tissue: Secondary | ICD-10-CM

## 2022-03-13 DIAGNOSIS — Z0000 Encounter for general adult medical examination without abnormal findings: Secondary | ICD-10-CM | POA: Diagnosis not present

## 2022-03-13 DIAGNOSIS — R6882 Decreased libido: Secondary | ICD-10-CM

## 2022-03-13 DIAGNOSIS — E669 Obesity, unspecified: Secondary | ICD-10-CM

## 2022-03-13 DIAGNOSIS — Q828 Other specified congenital malformations of skin: Secondary | ICD-10-CM | POA: Diagnosis not present

## 2022-03-13 DIAGNOSIS — E785 Hyperlipidemia, unspecified: Secondary | ICD-10-CM | POA: Diagnosis not present

## 2022-03-13 DIAGNOSIS — Z1159 Encounter for screening for other viral diseases: Secondary | ICD-10-CM | POA: Diagnosis not present

## 2022-03-13 HISTORY — DX: Decreased libido: R68.82

## 2022-03-13 LAB — CBC WITH DIFFERENTIAL/PLATELET
Basophils Absolute: 0 10*3/uL (ref 0.0–0.1)
Basophils Relative: 0.3 % (ref 0.0–3.0)
Eosinophils Absolute: 0 10*3/uL (ref 0.0–0.7)
Eosinophils Relative: 0.4 % (ref 0.0–5.0)
HCT: 36.3 % (ref 36.0–46.0)
Hemoglobin: 12.2 g/dL (ref 12.0–15.0)
Lymphocytes Relative: 34.1 % (ref 12.0–46.0)
Lymphs Abs: 2.1 10*3/uL (ref 0.7–4.0)
MCHC: 33.6 g/dL (ref 30.0–36.0)
MCV: 94.1 fl (ref 78.0–100.0)
Monocytes Absolute: 0.3 10*3/uL (ref 0.1–1.0)
Monocytes Relative: 4.6 % (ref 3.0–12.0)
Neutro Abs: 3.7 10*3/uL (ref 1.4–7.7)
Neutrophils Relative %: 60.6 % (ref 43.0–77.0)
Platelets: 261 10*3/uL (ref 150.0–400.0)
RBC: 3.86 Mil/uL — ABNORMAL LOW (ref 3.87–5.11)
RDW: 13.4 % (ref 11.5–15.5)
WBC: 6.1 10*3/uL (ref 4.0–10.5)

## 2022-03-13 LAB — COMPREHENSIVE METABOLIC PANEL
ALT: 12 U/L (ref 0–35)
AST: 20 U/L (ref 0–37)
Albumin: 4.3 g/dL (ref 3.5–5.2)
Alkaline Phosphatase: 84 U/L (ref 39–117)
BUN: 11 mg/dL (ref 6–23)
CO2: 29 mEq/L (ref 19–32)
Calcium: 9.9 mg/dL (ref 8.4–10.5)
Chloride: 105 mEq/L (ref 96–112)
Creatinine, Ser: 1.1 mg/dL (ref 0.40–1.20)
GFR: 58.34 mL/min — ABNORMAL LOW (ref >60.00)
Glucose, Bld: 91 mg/dL (ref 70–99)
Potassium: 4.6 mEq/L (ref 3.5–5.1)
Sodium: 140 mEq/L (ref 135–145)
Total Bilirubin: 0.9 mg/dL (ref 0.2–1.2)
Total Protein: 7.3 g/dL (ref 6.0–8.3)

## 2022-03-13 LAB — LIPID PANEL
Cholesterol: 197 mg/dL (ref 0–200)
HDL: 67.2 mg/dL (ref >39.00)
LDL Cholesterol: 109 mg/dL — ABNORMAL HIGH (ref 0–99)
NonHDL: 129.84
Total CHOL/HDL Ratio: 3
Triglycerides: 106 mg/dL (ref 0.0–149.0)
VLDL: 21.2 mg/dL (ref 0.0–40.0)

## 2022-03-13 LAB — VITAMIN D 25 HYDROXY (VIT D DEFICIENCY, FRACTURES): VITD: 25.2 ng/mL — ABNORMAL LOW (ref 30.00–100.00)

## 2022-03-13 NOTE — Assessment & Plan Note (Signed)
Seen in the past ?Check lipid panel ?

## 2022-03-13 NOTE — Assessment & Plan Note (Addendum)
Improved ?Encourage continued diet and exercise ?Restratification labs CMP, lipid panel ?

## 2022-03-13 NOTE — Progress Notes (Signed)
? ?Established Patient Office Visit ? ?Subjective   ?Patient ID: Rhonda Moss, female    DOB: [birth year]  Age: 51 y.o. MRN: [id number] ? ?Chief Complaint  ?Patient presents with  ? Transitions Of Care  ?  TOC. Needs a referral panniculectomy. Wants to talk about sex drive.  ? ? ?HPI ?Patient presents here for annual exam. ? ?Patient complains of low libido since hysterectomy few years ago.  She still has 1 ovary.  She says she is going through symptoms of hot flashes recently.  She had similar symptoms when she was on the tamoxifen but has not had these in several years.  Also endorses some mood disturbances, but not life altering ? ?Patient has had significant intentional Weightloss of 52 lbs w/ diet and exercise, put has developed a lot excess skin the in stomache area and it causes significant irritation. ? ?Current Concern: As above ? ?Diet and Exercise: As above ? ?Cancer Screenings: ?Patient has history of breast cancer and family history of breast cancer.  Status post radiation. Has been discharged from oncology.  She is up-to-date on that mammogram.  She is up-to-date on colon cancer screening as she had Cologuard 2 years ago.  No longer needs Pap smears as she has had hysterectomy ? ?ROS ?Denies chest pain shortness of breath ?  ?Objective:  ?  ? ?BP 110/78 (BP Location: Right Leg, Patient Position: Sitting, Cuff Size: Normal)   Pulse 66   Temp (!) 97 ?F (36.1 ?C) (Temporal)   Ht 5' 6.5" (1.689 m)   Wt 222 lb 9.6 oz (101 kg)   LMP 08/31/2018 (Approximate)   SpO2 99%   BMI 35.39 kg/m?  ?BP Readings from Last 3 Encounters:  ?03/13/22 110/78  ?07/05/20 138/82  ?05/29/20 (!) 142/84  ? ?Wt Readings from Last 3 Encounters:  ?03/13/22 222 lb 9.6 oz (101 kg)  ?07/05/20 248 lb 12.8 oz (112.9 kg)  ?05/29/20 244 lb 11.2 oz (111 kg)  ? ?SpO2 Readings from Last 3 Encounters:  ?03/13/22 99%  ?07/05/20 98%  ?05/29/20 100%  ? ? Gen: NAD, resting comfortably ?CV: RRR with no murmurs appreciated ?Pulm: NWOB, CTAB  with no crackles, wheezes, or rhonchi ?GI: Normal bowel sounds present. Soft, Nontender, Nondistended, excess loose skin ?MSK: no edema, cyanosis, or clubbing noted ?Skin: warm, dry ?Neuro: grossly normal, moves all extremities ?Psych: Normal affect and thought content ? ? ? ?No results found for any visits on 03/13/22. ? ? ?The 10-year ASCVD risk score (Arnett DK, et al., 2019) is: 0.9% ? ?  ?Assessment & Plan:  ? ?Problem List Items Addressed This Visit   ? ?  ? Musculoskeletal and Integument  ? Cutis laxa  ?  Status post weight loss ?Irritation painful ?Refer to plastic surgery for further evaluation ? ?  ?  ? Relevant Orders  ? Ambulatory referral to Plastic Surgery  ?  ? Other  ? IDA (iron deficiency anemia)  ?  IDA in the past secondary to blood loss from menorrhagia ?Has had cessation of bleeding status post hysterectomy ?Last CBC approximately 2 years ago was borderline normal at 11.8 ?Has no symptoms of anemia ?Check CBC ? ?  ?  ? Hyperlipidemia  ?  Seen in the past ?Check lipid panel ? ?  ?  ? Relevant Orders  ? Comp Met (CMET)  ? Lipid Profile  ? Obesity  ?  Improved ?Encourage continued diet and exercise ?Restratification labs CMP, lipid panel ? ?  ?  ?  Relevant Orders  ? CBC w/Diff  ? VITAMIN D 25 Hydroxy (Vit-D Deficiency, Fractures)  ? Comp Met (CMET)  ? Lipid Profile  ? Low libido  ?  Low libido, likely associated with menopause and associated symptoms ?Difficult determined based on menses as patient has had sensation status post hysterectomy ?With history of breast cancer, concern for treatment, particularly with any type of steroid regimen ?Could possibly benefit from anti-depressant such as Wellbutrin or SSRI, however patient concerned about possible side effects including suicidal thoughts ?We will refer to OB/GYN for further assessment ? ?  ?  ? Relevant Orders  ? Ambulatory referral to Gynecology  ? ?Other Visit Diagnoses   ? ? Encounter for hepatitis C screening test for low risk patient    -   Primary  ? Relevant Orders  ? Hepatitis C Antibody  ? Encounter for physical examination      ? Vitamin D deficiency      ? Relevant Orders  ? VITAMIN D 25 Hydroxy (Vit-D Deficiency, Fractures)  ? Excess skin      ? Relevant Orders  ? Ambulatory referral to Plastic Surgery  ? History of breast cancer in female      ? ?  ? ? ?Return in about 1 year (around 03/14/2023).  ? ? ?Bonnita Hollow, MD ? ?

## 2022-03-13 NOTE — Patient Instructions (Signed)
Especially due today. ?We are getting routine labs as discussed. ?We are referring to OB/GYN to discuss low libido. ?We are referring to plastic surgery to deal with excess skin around your abdomen. ?

## 2022-03-13 NOTE — Assessment & Plan Note (Signed)
IDA in the past secondary to blood loss from menorrhagia ?Has had cessation of bleeding status post hysterectomy ?Last CBC approximately 2 years ago was borderline normal at 11.8 ?Has no symptoms of anemia ?Check CBC ?

## 2022-03-13 NOTE — Progress Notes (Signed)
Anemia has resolved, Vitamin D is low again, recommend restart over the counter vitamin D, remainder of labs are normal.

## 2022-03-13 NOTE — Assessment & Plan Note (Signed)
Status post weight loss ?Irritation painful ?Refer to plastic surgery for further evaluation ?

## 2022-03-13 NOTE — Assessment & Plan Note (Signed)
Low libido, likely associated with menopause and associated symptoms ?Difficult determined based on menses as patient has had sensation status post hysterectomy ?With history of breast cancer, concern for treatment, particularly with any type of steroid regimen ?Could possibly benefit from anti-depressant such as Wellbutrin or SSRI, however patient concerned about possible side effects including suicidal thoughts ?We will refer to OB/GYN for further assessment ?

## 2022-03-14 LAB — HEPATITIS C ANTIBODY
Hepatitis C Ab: NONREACTIVE
SIGNAL TO CUT-OFF: 0.1 (ref <1.00)

## 2022-03-19 ENCOUNTER — Encounter: Payer: Self-pay | Admitting: Plastic Surgery

## 2022-03-19 DIAGNOSIS — M793 Panniculitis, unspecified: Secondary | ICD-10-CM | POA: Diagnosis not present

## 2022-05-12 DIAGNOSIS — H538 Other visual disturbances: Secondary | ICD-10-CM | POA: Diagnosis not present

## 2022-06-12 ENCOUNTER — Other Ambulatory Visit: Payer: Self-pay | Admitting: *Deleted

## 2022-06-12 DIAGNOSIS — Z1231 Encounter for screening mammogram for malignant neoplasm of breast: Secondary | ICD-10-CM

## 2022-06-12 DIAGNOSIS — I739 Peripheral vascular disease, unspecified: Secondary | ICD-10-CM

## 2022-06-18 ENCOUNTER — Ambulatory Visit
Admission: RE | Admit: 2022-06-18 | Discharge: 2022-06-18 | Disposition: A | Discharge status: Home / Self Care | Payer: Medicaid Other | Source: Ambulatory Visit | Attending: *Deleted | Admitting: *Deleted

## 2022-06-18 DIAGNOSIS — Z1231 Encounter for screening mammogram for malignant neoplasm of breast: Secondary | ICD-10-CM | POA: Diagnosis not present

## 2022-07-08 ENCOUNTER — Ambulatory Visit (INDEPENDENT_AMBULATORY_CARE_PROVIDER_SITE_OTHER): Payer: Medicaid Other | Admitting: Obstetrics & Gynecology

## 2022-07-08 DIAGNOSIS — Z853 Personal history of malignant neoplasm of breast: Secondary | ICD-10-CM | POA: Diagnosis not present

## 2022-07-08 DIAGNOSIS — Z9889 Other specified postprocedural states: Secondary | ICD-10-CM | POA: Diagnosis not present

## 2022-07-08 DIAGNOSIS — B009 Herpesviral infection, unspecified: Secondary | ICD-10-CM | POA: Diagnosis not present

## 2022-07-08 DIAGNOSIS — Z01419 Encounter for gynecological examination (general) (routine) without abnormal findings: Secondary | ICD-10-CM

## 2022-07-08 DIAGNOSIS — Z90710 Acquired absence of both cervix and uterus: Secondary | ICD-10-CM

## 2022-07-08 MED ORDER — VALACYCLOVIR HCL 500 MG PO TABS: ORAL_TABLET | ORAL | 3 refills | Status: AC

## 2022-07-08 NOTE — Patient Instructions (Addendum)
[place]  Boric acid vaginal suppository '600mg'$ , place one vaginally

## 2022-07-08 NOTE — Progress Notes (Signed)
51 y.o. [id number] Married [place] American female here for annual exam.  Doing well.  Has been working on weight loss.  Has panniculectomy scheduled with Dr. Iran Planas 07/25/2022.    Denies vaginal bleeding.    Off Tamoxifen since 2020.    Patient's last menstrual period was 08/31/2018 (approximate).          Sexually active: Yes.    The current method of family planning is status post hysterectomy.    Exercising: Yes.     Smoker:  no  Health Maintenance: Pap:  08/2018 neg with neg HR HPV History of abnormal Pap:  LEEP 1991 MMG:  06/20/2022 Colonoscopy:  did cologuard 2021.  No result is in system. BMD:   not indicated Screening Labs: done 02/2022   reports that she has never smoked. She has never used smokeless tobacco. She reports current alcohol use of about 1.0 standard drink of alcohol per week. She reports that she does not use drugs.  Past Medical History:  Diagnosis Date   Allergy    Anxiety    Breast cancer ([place]) dx. 44 04/11/15   ILC of left breast   Ectopic pregnancy    Family history of breast cancer    Family history of breast cancer    Family history of prostate cancer    Malignant neoplasm of upper-outer quadrant of left breast in female, estrogen receptor positive ([place]) 04/29/2015   Personal history of radiation therapy     Past Surgical History:  Procedure Laterality Date   BREAST BIOPSY Left 03/2015   BREAST LUMPECTOMY Left 06/2015   BREAST LUMPECTOMY WITH RADIOACTIVE SEED AND SENTINEL LYMPH NODE BIOPSY Left 06/22/2015   Procedure: LEFT BREAST LUMPECTOMY WITH RADIOACTIVE SEED AND SENTINEL LYMPH NODE BIOPSY;  Surgeon: Coralie Keens, MD;  Location: [place];  Service: General;  Laterality: Left;   CERVICAL BIOPSY  W/ [place]  2002   [place] N/A 11/29/2018   Procedure: CYSTOSCOPY;  Surgeon: Megan Salon, MD;  Location: [facility];  Service: Gynecology;   Laterality: N/A;   LYMPHADENECTOMY  1992   single node in right neck   SALPINGECTOMY Left 10/2005   STRABISMUS SURGERY  2016   TOTAL LAPAROSCOPIC HYSTERECTOMY WITH SALPINGECTOMY Bilateral 11/29/2018   Procedure: TOTAL LAPAROSCOPIC HYSTERECTOMY WITH SALPINGECTOMY;  Surgeon: Megan Salon, MD;  Location: [facility];  Service: Gynecology;  Laterality: Bilateral;  Possible BSO. Extended recovery bed needed    Current Outpatient Medications  Medication Sig Dispense Refill   valACYclovir (VALTREX) 500 MG tablet Take 1 tablet daily for suppressive therapy.  With symptoms, increase to bid x 3 days. 30 tablet 3   ibuprofen (ADVIL,MOTRIN) 200 MG tablet Take 4 tablets (800 mg total) by mouth every 8 (eight) hours as needed for headache, mild pain, moderate pain or cramping (pain). 30 tablet 0   Vitamin D, Ergocalciferol, (DRISDOL) 1.25 MG (50000 UNIT) CAPS capsule Take 1 capsule (50,000 Units total) by mouth every 7 (seven) days. 12 capsule 0   No current facility-administered medications for this visit.    Family History  Problem Relation Age of Onset   Breast cancer Mother 63   Breast cancer Sister 24   Breast cancer Maternal Aunt 28       +calcifications   Prostate cancer Maternal Uncle 67   Heart attack Maternal Grandmother    Heart attack Maternal Grandfather    Prostate  cancer Maternal Grandfather 50   Diabetes Paternal Grandmother    Pancreatitis Paternal Grandfather    Prostate cancer Cousin        dx. late 82s    ROS: Constitutional: negative Genitourinary:negative  Exam:   LMP 08/31/2018 (Approximate)      General appearance: alert, cooperative and appears stated age Head: Normocephalic, without obvious abnormality, atraumatic Neck: no adenopathy, supple, symmetrical, trachea midline and thyroid normal to inspection and palpation Lungs: clear to auscultation bilaterally Breasts: right breast without masses, skin changes, LAD; left breast with well healed  breast scar and axillary scar with radiation changes, no masses, LAD, nipple discharge noted Heart: regular rate and rhythm Abdomen: soft, non-tender; bowel sounds normal; no masses,  no organomegaly Extremities: extremities normal, atraumatic, no cyanosis or edema Skin: Skin color, texture, turgor normal. No rashes or lesions Lymph nodes: Cervical, supraclavicular, and axillary nodes normal. No abnormal inguinal nodes palpated Neurologic: Grossly normal   Pelvic: External genitalia:  no lesions              Urethra:  normal appearing urethra with no masses, tenderness or lesions              Bartholins and Skenes: normal                 Vagina: normal appearing vagina with normal color and no discharge, no lesions              Cervix: absent              Pap taken: No. Bimanual Exam:  Uterus:  uterus absent              Adnexa: no mass, fullness, tenderness               Rectovaginal: Confirms               Anus:  normal sphincter tone, no lesions  Chaperone, Ezekiel Ina, RN, was present for exam.  Assessment/Plan: 1. Well woman exam with routine gynecological exam - Pap smear not indicated due to TLH/bilateral salpingectomy 11/29/2018 - Mammogram 06/20/2022 - Colonoscopy not done but pt reports she did cologuard.  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  With symptoms, increase to bid x 3 days.  Dispense: 30 tablet; Refill: 3  3. History of breast cancer  4.  H/O LEEP 1991

## 2022-07-11 ENCOUNTER — Encounter (HOSPITAL_BASED_OUTPATIENT_CLINIC_OR_DEPARTMENT_OTHER): Payer: Self-pay | Admitting: Obstetrics & Gynecology

## 2022-07-11 DIAGNOSIS — Z90710 Acquired absence of both cervix and uterus: Secondary | ICD-10-CM

## 2022-07-11 DIAGNOSIS — Z9889 Other specified postprocedural states: Secondary | ICD-10-CM | POA: Insufficient documentation

## 2022-07-11 DIAGNOSIS — B009 Herpesviral infection, unspecified: Secondary | ICD-10-CM | POA: Insufficient documentation

## 2022-07-11 DIAGNOSIS — Z853 Personal history of malignant neoplasm of breast: Secondary | ICD-10-CM | POA: Insufficient documentation

## 2022-07-11 HISTORY — DX: Acquired absence of both cervix and uterus: Z90.710

## 2022-07-11 HISTORY — DX: Other specified postprocedural states: Z98.890

## 2022-07-16 ENCOUNTER — Encounter (HOSPITAL_BASED_OUTPATIENT_CLINIC_OR_DEPARTMENT_OTHER): Payer: Self-pay | Admitting: Plastic Surgery

## 2022-07-16 NOTE — H&P (Addendum)
Subjective:     Patient ID: Rhonda Moss is a 51 y.o. female.   HPI   Here for follow up discussion prior to planned panniculectomy. Referred by her trainer who is a patient. Highest wt 375 lb approximately 2014. Through diet and exercise current is lowest weight. Reports started new regimen/plan August 2022 and 50 lb wt loss during that period. Wt down 4 lb since last visit here May 2023. Goal 200 lb.   Reports recurrent infection beneath soft tissue abdomen approximately once every 3 weeks. This has not been controlled with hygiene measures, topical creams and antimicrobial ointments for over 3 month trial. Also notes weight soft tissue interferes with activities and contributes to back pain.   PMH significant for anemia secondary to menorrhagia now s/p hysterectomy. Hb 12.2 02/2022. Also significant for left breast ca s/p lumpectomy RT.    FH significant for breast ca in mother sister and MA. Genetics negative. Completed 5 years tamoxifen. Discharged from Oncology follow up.   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 Two adult children. Lives with spouse.   Review of Systems  Musculoskeletal: Positive for back pain.  Skin: Positive for rash.    Remainder 12 point review negative    Objective:   Physical Exam Cardiovascular:     Rate and Rhythm: Normal rate and regular rhythm.     Heart sounds: Normal heart sounds.  Pulmonary:     Effort: Pulmonary effort is normal.     Breath sounds: Normal breath sounds.  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   Reviewed panniculectomy vs abdominoplasty. Reviewed panniculectomy is not a cosmetic procedure.  Reviewed changes with aging, wt gain/loss, will not have as taught skin given significant loss elasticity. I would plan umbilical transposition in her case. Reviewed drains, post op limitations, time off work. Reviewed scar maturation over months. Reviewed in mirror area of scars, area soft tissue resection, and expected elevation mons. Counseled will not change contour mons and will not affect chest soft tissue rolls. Latter could be addressed in future with reverse abdominoplasty or as part of brachioplasty.   Additional risks including but not limited to bleeding seroma hematoma  Infection wound healing problems need for additional procedures unacceptable cosmetic result asymmetry blood clots in legs or lungs damage to adjacent structures reviewed.  Plan overnight stay. Drain teaching completed. Rx for Norco given.

## 2022-07-24 NOTE — Progress Notes (Signed)

## 2022-07-25 ENCOUNTER — Ambulatory Visit (HOSPITAL_BASED_OUTPATIENT_CLINIC_OR_DEPARTMENT_OTHER): Payer: Medicaid Other | Admitting: Certified Registered"

## 2022-07-25 ENCOUNTER — Encounter (HOSPITAL_BASED_OUTPATIENT_CLINIC_OR_DEPARTMENT_OTHER)
Admission: RE | Disposition: A | Discharge status: Home / Self Care | Payer: Self-pay | Source: Home / Self Care | Attending: Plastic Surgery

## 2022-07-25 ENCOUNTER — Other Ambulatory Visit: Payer: Self-pay

## 2022-07-25 ENCOUNTER — Encounter (HOSPITAL_BASED_OUTPATIENT_CLINIC_OR_DEPARTMENT_OTHER): Payer: Self-pay | Admitting: Plastic Surgery

## 2022-07-25 ENCOUNTER — Ambulatory Visit (HOSPITAL_BASED_OUTPATIENT_CLINIC_OR_DEPARTMENT_OTHER)
Admission: RE | Admit: 2022-07-25 | Discharge: 2022-07-26 | Disposition: A | Discharge status: Home / Self Care | Payer: Medicaid Other | Attending: Plastic Surgery | Admitting: Plastic Surgery

## 2022-07-25 DIAGNOSIS — D500 Iron deficiency anemia secondary to blood loss (chronic): Secondary | ICD-10-CM | POA: Diagnosis not present

## 2022-07-25 DIAGNOSIS — Z9079 Acquired absence of other genital organ(s): Secondary | ICD-10-CM | POA: Insufficient documentation

## 2022-07-25 DIAGNOSIS — Z803 Family history of malignant neoplasm of breast: Secondary | ICD-10-CM | POA: Insufficient documentation

## 2022-07-25 DIAGNOSIS — M793 Panniculitis, unspecified: Secondary | ICD-10-CM

## 2022-07-25 DIAGNOSIS — Z01818 Encounter for other preprocedural examination: Secondary | ICD-10-CM

## 2022-07-25 DIAGNOSIS — C50912 Malignant neoplasm of unspecified site of left female breast: Secondary | ICD-10-CM | POA: Diagnosis not present

## 2022-07-25 HISTORY — PX: PANNICULECTOMY: SHX5360

## 2022-07-25 HISTORY — DX: Panniculitis, unspecified: M79.3

## 2022-07-25 SURGERY — PANNICULECTOMY
Anesthesia: General | Site: Abdomen

## 2022-07-25 MED ORDER — ONDANSETRON HCL 4 MG/2ML IJ SOLN
INTRAMUSCULAR | Status: AC
  Filled 2022-07-25: qty 2

## 2022-07-25 MED ORDER — CHLORHEXIDINE GLUCONATE CLOTH 2 % EX PADS: 6.0000 | MEDICATED_PAD | Freq: Once | CUTANEOUS | Status: DC

## 2022-07-25 MED ORDER — MIDAZOLAM HCL 5 MG/5ML IJ SOLN
INTRAMUSCULAR | Status: DC | PRN
  Administered 2022-07-25: 2 mg via INTRAVENOUS

## 2022-07-25 MED ORDER — HYDROMORPHONE HCL 1 MG/ML IJ SOLN
0.2500 mg | INTRAMUSCULAR | Status: DC | PRN
  Administered 2022-07-25 (×2): 0.5 mg via INTRAVENOUS

## 2022-07-25 MED ORDER — HEPARIN SODIUM (PORCINE) 5000 UNIT/ML IJ SOLN
INTRAMUSCULAR | Status: AC
  Filled 2022-07-25: qty 1

## 2022-07-25 MED ORDER — DEXAMETHASONE SODIUM PHOSPHATE 4 MG/ML IJ SOLN
INTRAMUSCULAR | Status: DC | PRN
  Administered 2022-07-25: 5 mg via INTRAVENOUS

## 2022-07-25 MED ORDER — KCL IN DEXTROSE-NACL 20-5-0.45 MEQ/L-%-% IV SOLN
INTRAVENOUS | Status: DC
  Filled 2022-07-25: qty 1000

## 2022-07-25 MED ORDER — PROPOFOL 10 MG/ML IV BOLUS
INTRAVENOUS | Status: DC | PRN
  Administered 2022-07-25: 150 mg via INTRAVENOUS

## 2022-07-25 MED ORDER — OXYCODONE HCL 5 MG PO TABS: 5.0000 mg | ORAL_TABLET | Freq: Once | ORAL | Status: DC | PRN

## 2022-07-25 MED ORDER — HYDROMORPHONE HCL 1 MG/ML IJ SOLN
INTRAMUSCULAR | Status: AC
  Filled 2022-07-25: qty 0.5

## 2022-07-25 MED ORDER — PROPOFOL 500 MG/50ML IV EMUL
INTRAVENOUS | Status: AC
  Filled 2022-07-25: qty 50

## 2022-07-25 MED ORDER — DEXAMETHASONE SODIUM PHOSPHATE 10 MG/ML IJ SOLN
INTRAMUSCULAR | Status: AC
  Filled 2022-07-25: qty 1

## 2022-07-25 MED ORDER — LIDOCAINE HCL (CARDIAC) PF 100 MG/5ML IV SOSY
PREFILLED_SYRINGE | INTRAVENOUS | Status: DC | PRN
  Administered 2022-07-25: 60 mg via INTRAVENOUS

## 2022-07-25 MED ORDER — FENTANYL CITRATE (PF) 100 MCG/2ML IJ SOLN
INTRAMUSCULAR | Status: AC
  Filled 2022-07-25: qty 2

## 2022-07-25 MED ORDER — CEFAZOLIN SODIUM-DEXTROSE 2-4 GM/100ML-% IV SOLN
INTRAVENOUS | Status: AC
  Filled 2022-07-25: qty 100

## 2022-07-25 MED ORDER — BUPIVACAINE HCL (PF) 0.5 % IJ SOLN
INTRAMUSCULAR | Status: DC | PRN
  Administered 2022-07-25: 30 mL

## 2022-07-25 MED ORDER — METHOCARBAMOL 500 MG PO TABS
500.0000 mg | ORAL_TABLET | Freq: Four times a day (QID) | ORAL | Status: DC | PRN
  Administered 2022-07-25 (×2): 500 mg via ORAL
  Filled 2022-07-25 (×2): qty 1

## 2022-07-25 MED ORDER — SUCCINYLCHOLINE CHLORIDE 200 MG/10ML IV SOSY
PREFILLED_SYRINGE | INTRAVENOUS | Status: AC
  Filled 2022-07-25: qty 10

## 2022-07-25 MED ORDER — HYDROMORPHONE HCL 1 MG/ML IJ SOLN: 0.5000 mg | INTRAMUSCULAR | Status: DC | PRN

## 2022-07-25 MED ORDER — SUGAMMADEX SODIUM 500 MG/5ML IV SOLN
INTRAVENOUS | Status: DC | PRN
  Administered 2022-07-25: 250 mg via INTRAVENOUS

## 2022-07-25 MED ORDER — OXYCODONE HCL 5 MG/5ML PO SOLN: 5.0000 mg | Freq: Once | ORAL | Status: DC | PRN

## 2022-07-25 MED ORDER — ROCURONIUM BROMIDE 10 MG/ML (PF) SYRINGE
PREFILLED_SYRINGE | INTRAVENOUS | Status: AC
  Filled 2022-07-25: qty 10

## 2022-07-25 MED ORDER — CELECOXIB 200 MG PO CAPS
ORAL_CAPSULE | ORAL | Status: AC
  Filled 2022-07-25: qty 1

## 2022-07-25 MED ORDER — ACETAMINOPHEN 500 MG PO TABS
ORAL_TABLET | ORAL | Status: AC
  Filled 2022-07-25: qty 2

## 2022-07-25 MED ORDER — ROCURONIUM BROMIDE 100 MG/10ML IV SOLN: INTRAVENOUS | Status: DC | PRN

## 2022-07-25 MED ORDER — LACTATED RINGERS IV SOLN: INTRAVENOUS | Status: DC

## 2022-07-25 MED ORDER — GABAPENTIN 300 MG PO CAPS
300.0000 mg | ORAL_CAPSULE | ORAL | Status: AC
  Administered 2022-07-25: 300 mg via ORAL

## 2022-07-25 MED ORDER — ATROPINE SULFATE 0.4 MG/ML IV SOLN
INTRAVENOUS | Status: AC
  Filled 2022-07-25: qty 1

## 2022-07-25 MED ORDER — BUPIVACAINE HCL (PF) 0.5 % IJ SOLN
INTRAMUSCULAR | Status: AC
  Filled 2022-07-25: qty 60

## 2022-07-25 MED ORDER — LIDOCAINE 2% (20 MG/ML) 5 ML SYRINGE
INTRAMUSCULAR | Status: AC
  Filled 2022-07-25: qty 5

## 2022-07-25 MED ORDER — ENOXAPARIN SODIUM 40 MG/0.4ML IJ SOSY
40.0000 mg | PREFILLED_SYRINGE | INTRAMUSCULAR | Status: DC
  Administered 2022-07-26: 40 mg via SUBCUTANEOUS
  Filled 2022-07-25: qty 0.4

## 2022-07-25 MED ORDER — SUGAMMADEX SODIUM 500 MG/5ML IV SOLN: INTRAVENOUS | Status: DC | PRN

## 2022-07-25 MED ORDER — FENTANYL CITRATE (PF) 100 MCG/2ML IJ SOLN
INTRAMUSCULAR | Status: DC | PRN
Start: 2022-07-25 — End: 2022-07-25
  Administered 2022-07-25: 50 ug via INTRAVENOUS
  Administered 2022-07-25: 100 ug via INTRAVENOUS
  Administered 2022-07-25: 50 ug via INTRAVENOUS

## 2022-07-25 MED ORDER — PHENYLEPHRINE 80 MCG/ML (10ML) SYRINGE FOR IV PUSH (FOR BLOOD PRESSURE SUPPORT)
PREFILLED_SYRINGE | INTRAVENOUS | Status: AC
  Filled 2022-07-25: qty 10

## 2022-07-25 MED ORDER — ROCURONIUM BROMIDE 100 MG/10ML IV SOLN
INTRAVENOUS | Status: DC | PRN
  Administered 2022-07-25: 50 mg via INTRAVENOUS
  Administered 2022-07-25: 10 mg via INTRAVENOUS
  Administered 2022-07-25: 20 mg via INTRAVENOUS

## 2022-07-25 MED ORDER — CEFAZOLIN SODIUM-DEXTROSE 2-4 GM/100ML-% IV SOLN
2.0000 g | INTRAVENOUS | Status: AC
  Administered 2022-07-25: 2 g via INTRAVENOUS

## 2022-07-25 MED ORDER — ACETAMINOPHEN 500 MG PO TABS
1000.0000 mg | ORAL_TABLET | ORAL | Status: AC
  Administered 2022-07-25: 1000 mg via ORAL

## 2022-07-25 MED ORDER — HYDROCODONE-ACETAMINOPHEN 5-325 MG PO TABS
1.0000 | ORAL_TABLET | ORAL | Status: DC | PRN
  Administered 2022-07-25: 2 via ORAL
  Filled 2022-07-25: qty 2

## 2022-07-25 MED ORDER — HYDROMORPHONE HCL 1 MG/ML IJ SOLN
INTRAMUSCULAR | Status: DC | PRN
  Administered 2022-07-25: .5 mg via INTRAVENOUS

## 2022-07-25 MED ORDER — ONDANSETRON 4 MG PO TBDP: 4.0000 mg | ORAL_TABLET | Freq: Four times a day (QID) | ORAL | Status: DC | PRN

## 2022-07-25 MED ORDER — CELECOXIB 200 MG PO CAPS
200.0000 mg | ORAL_CAPSULE | ORAL | Status: AC
  Administered 2022-07-25: 200 mg via ORAL

## 2022-07-25 MED ORDER — 0.9 % SODIUM CHLORIDE (POUR BTL) OPTIME
TOPICAL | Status: DC | PRN
  Administered 2022-07-25: 120 mL

## 2022-07-25 MED ORDER — MIDAZOLAM HCL 2 MG/2ML IJ SOLN
INTRAMUSCULAR | Status: AC
  Filled 2022-07-25: qty 2

## 2022-07-25 MED ORDER — EPHEDRINE SULFATE (PRESSORS) 50 MG/ML IJ SOLN
INTRAMUSCULAR | Status: DC | PRN
  Administered 2022-07-25: 10 mg via INTRAVENOUS

## 2022-07-25 MED ORDER — ONDANSETRON HCL 4 MG/2ML IJ SOLN: 4.0000 mg | Freq: Four times a day (QID) | INTRAMUSCULAR | Status: DC | PRN

## 2022-07-25 MED ORDER — HEPARIN SODIUM (PORCINE) 5000 UNIT/ML IJ SOLN
5000.0000 [IU] | INTRAMUSCULAR | Status: AC
  Administered 2022-07-25: 5000 [IU] via SUBCUTANEOUS

## 2022-07-25 MED ORDER — SUCCINYLCHOLINE CHLORIDE 200 MG/10ML IV SOSY
PREFILLED_SYRINGE | INTRAVENOUS | Status: DC | PRN
  Administered 2022-07-25: 120 mg via INTRAVENOUS

## 2022-07-25 MED ORDER — HYDROMORPHONE HCL 1 MG/ML IJ SOLN
INTRAMUSCULAR | Status: AC
  Filled 2022-07-25: qty 1

## 2022-07-25 MED ORDER — KETOROLAC TROMETHAMINE 30 MG/ML IJ SOLN
30.0000 mg | Freq: Three times a day (TID) | INTRAMUSCULAR | Status: AC
  Administered 2022-07-25 – 2022-07-26 (×3): 30 mg via INTRAVENOUS
  Filled 2022-07-25 (×3): qty 1

## 2022-07-25 MED ORDER — EPHEDRINE 5 MG/ML INJ
INTRAVENOUS | Status: AC
  Filled 2022-07-25: qty 5

## 2022-07-25 MED ORDER — SUGAMMADEX SODIUM 500 MG/5ML IV SOLN
INTRAVENOUS | Status: AC
  Filled 2022-07-25: qty 5

## 2022-07-25 MED ORDER — GABAPENTIN 300 MG PO CAPS
ORAL_CAPSULE | ORAL | Status: AC
  Filled 2022-07-25: qty 1

## 2022-07-25 SURGICAL SUPPLY — 58 items
ADH SKN CLS APL DERMABOND .7 (GAUZE/BANDAGES/DRESSINGS) ×4
APL PRP STRL LF DISP 70% ISPRP (MISCELLANEOUS) ×1
APPLIER CLIP 9.375 MED OPEN (MISCELLANEOUS) ×1
APR CLP MED 9.3 20 MLT OPN (MISCELLANEOUS) ×1
BINDER ABDOMINAL 10 UNV 27-48 (MISCELLANEOUS) IMPLANT
BINDER ABDOMINAL 12 SM 30-45 (SOFTGOODS) IMPLANT
BLADE CLIPPER SURG (BLADE) IMPLANT
BLADE SURG 10 STRL SS (BLADE) ×2 IMPLANT
BLADE SURG 11 STRL SS (BLADE) ×1 IMPLANT
BLADE SURG 15 STRL LF DISP TIS (BLADE) IMPLANT
BLADE SURG 15 STRL SS (BLADE) ×1
CANISTER SUCT 1200ML W/VALVE (MISCELLANEOUS) ×1 IMPLANT
CHLORAPREP W/TINT 26 (MISCELLANEOUS) ×1 IMPLANT
CLIP APPLIE 9.375 MED OPEN (MISCELLANEOUS) ×1 IMPLANT
COVER BACK TABLE 60X90IN (DRAPES) ×1 IMPLANT
COVER MAYO STAND STRL (DRAPES) ×1 IMPLANT
DERMABOND ADVANCED (GAUZE/BANDAGES/DRESSINGS) ×4
DERMABOND ADVANCED .7 DNX12 (GAUZE/BANDAGES/DRESSINGS) ×2 IMPLANT
DRAIN CHANNEL 15F RND FF W/TCR (WOUND CARE) ×2 IMPLANT
DRAIN CHANNEL 19F RND (DRAIN) IMPLANT
DRAPE TOP ARMCOVERS (MISCELLANEOUS) ×1 IMPLANT
DRAPE U-SHAPE 76X120 STRL (DRAPES) ×1 IMPLANT
DRAPE UTILITY XL STRL (DRAPES) ×1 IMPLANT
ELECT BLADE 4.0 EZ CLEAN MEGAD (MISCELLANEOUS)
ELECT COATED BLADE 2.86 ST (ELECTRODE) IMPLANT
ELECT REM PT RETURN 9FT ADLT (ELECTROSURGICAL) ×1
ELECTRODE BLDE 4.0 EZ CLN MEGD (MISCELLANEOUS) IMPLANT
ELECTRODE REM PT RTRN 9FT ADLT (ELECTROSURGICAL) ×1 IMPLANT
EVACUATOR SILICONE 100CC (DRAIN) ×2 IMPLANT
GAUZE PAD ABD 8X10 STRL (GAUZE/BANDAGES/DRESSINGS) ×2 IMPLANT
GAUZE XEROFORM 1X8 LF (GAUZE/BANDAGES/DRESSINGS) IMPLANT
GLOVE BIO SURGEON STRL SZ 6 (GLOVE) ×3 IMPLANT
GOWN STRL REUS W/ TWL LRG LVL3 (GOWN DISPOSABLE) ×2 IMPLANT
GOWN STRL REUS W/TWL LRG LVL3 (GOWN DISPOSABLE) ×4
HEMOSTAT ARISTA ABSORB 3G PWDR (HEMOSTASIS) IMPLANT
NDL HYPO 25X1 1.5 SAFETY (NEEDLE) IMPLANT
NEEDLE HYPO 25X1 1.5 SAFETY (NEEDLE) IMPLANT
NS IRRIG 1000ML POUR BTL (IV SOLUTION) ×1 IMPLANT
PACK BASIN DAY SURGERY FS (CUSTOM PROCEDURE TRAY) ×1 IMPLANT
PENCIL SMOKE EVACUATOR (MISCELLANEOUS) ×1 IMPLANT
PIN SAFETY STERILE (MISCELLANEOUS) ×1 IMPLANT
SHEET MEDIUM DRAPE 40X70 STRL (DRAPES) ×2 IMPLANT
SLEEVE SCD COMPRESS KNEE MED (STOCKING) ×1 IMPLANT
SPONGE T-LAP 18X18 ~~LOC~~+RFID (SPONGE) ×2 IMPLANT
STAPLER VISISTAT 35W (STAPLE) ×1 IMPLANT
SUT ETHILON 2 0 FS 18 (SUTURE) ×2 IMPLANT
SUT MNCRL AB 4-0 PS2 18 (SUTURE) ×2 IMPLANT
SUT PDS AB 0 CT 36 (SUTURE) ×1 IMPLANT
SUT PDS AB 2-0 CT2 27 (SUTURE) IMPLANT
SUT PLAIN 5 0 P 3 18 (SUTURE) IMPLANT
SUT VLOC 180 0 24IN GS25 (SUTURE) ×1 IMPLANT
SYR BULB IRRIG 60ML STRL (SYRINGE) ×1 IMPLANT
SYR CONTROL 10ML LL (SYRINGE) IMPLANT
TOWEL GREEN STERILE FF (TOWEL DISPOSABLE) ×1 IMPLANT
TRAY FOLEY W/BAG SLVR 14FR LF (SET/KITS/TRAYS/PACK) IMPLANT
TUBE CONNECTING 20X1/4 (TUBING) ×1 IMPLANT
UNDERPAD 30X36 HEAVY ABSORB (UNDERPADS AND DIAPERS) ×2 IMPLANT
YANKAUER SUCT BULB TIP NO VENT (SUCTIONS) ×1 IMPLANT

## 2022-07-25 NOTE — Op Note (Signed)
Operative Note   DATE OF OPERATION: 9.8.23  LOCATION: [facility]-observation  SURGICAL DIVISION: Plastic Surgery  PREOPERATIVE DIAGNOSES:  Panniculitis  POSTOPERATIVE DIAGNOSES:  same  PROCEDURE:  Panniculectomy  SURGEON: Irene Limbo MD MBA  ASSISTANT: none  ANESTHESIA:  General.   EBL: 55 ml  COMPLICATIONS: None immediate.   INDICATIONS FOR PROCEDURE:  The patient, Rhonda Moss, is a 51 y.o. female born on 01-Oct-1971, is here for treatment chronic panniculitis that has failed conservative measures.   FINDINGS: Abdominal soft tissue resection 2651 g  DESCRIPTION OF PROCEDURE:  The patient was marked standing in the preoperative area to mark caudal incision over abdomen marked 7 cm from labial fourchette and extended over lateral abdomen. SQ heparin administered. The patient was taken to the operating room. SCDs were placed and IV antibiotics were given. The patient's operative site was prepped and draped in a sterile fashion. A time out was performed and all information was confirmed to be correct.     Low transverse abdominal incision made caudal to prior scar and carried through superficial fascia to abdominal wall. Skin flap elevated in sub Scarpa's layer, taking care to leave layer of subfascial fat over abdominal wall fascia. Dissection completed toward umbilicus. Umbilicus sharply incised and scissor dissection completed to free from abdominal skin flap. Additional dissection completed in midline toward xiphoid. Wound irrigated and hemostasis obtained. Local anesthetic infiltrated. 96 Fr JP placed in subcutaneous right and left abdomen and secured with 2-0 nylon. Patient then brought to semi sitting position. Caudal extent skin excision marked by palpation. Area marked excised. Superiorly based U shaped skin flap incised for delivery umbilicus. Low transverse abdominal skin incision closed with 0 PDS in superficial fascia. 0 V lock used to close dermis and 4-0  monocryl for subcuticular skin closure. Umbilicus inset with 4-0 monocryl in dermis and 5-0 plain gut for skin closure. Xeroform bolster placed within umbilicus. Dermabond applied.   Dry dressing and abdominal binder applied. The patient was allowed to wake from anesthesia, extubated and taken to the recovery room in satisfactory condition.   SPECIMENS: none  DRAINS: 27 Fr JP in right and left subcutaneous abdomen  Irene Limbo, MD [facility]  Office/ physician access line after hours [phone]

## 2022-07-25 NOTE — Anesthesia Postprocedure Evaluation (Signed)
Anesthesia Post Note  Patient: Rhonda Moss  Procedure(s) Performed: PANNICULECTOMY (Abdomen)     Patient location during evaluation: PACU Anesthesia Type: General Level of consciousness: awake and alert Pain management: pain level controlled Vital Signs Assessment: post-procedure vital signs reviewed and stable Respiratory status: spontaneous breathing, nonlabored ventilation, respiratory function stable and patient connected to nasal cannula oxygen Cardiovascular status: blood pressure returned to baseline and stable Postop Assessment: no apparent nausea or vomiting Anesthetic complications: no   No notable events documented.  Last Vitals:  Vitals:   07/25/22 1115 07/25/22 1130  BP: 105/64 112/65  Pulse: (!) 56 (!) 58  Resp: 12 16  Temp:  36.6 C  SpO2: 92% 97%    Last Pain:  Vitals:   07/25/22 1130  TempSrc:   PainSc: [place]

## 2022-07-25 NOTE — Interval H&P Note (Signed)
History and Physical Interval Note:  07/25/2022 6:56 AM  Rhonda Moss  has presented today for surgery, with the diagnosis of panniculitis.  The various methods of treatment have been discussed with the patient and family. After consideration of risks, benefits and other options for treatment, the patient has consented to  Procedure(s): PANNICULECTOMY (N/A) as a surgical intervention.  The patient's history has been reviewed, patient examined, no change in status, stable for surgery.  I have reviewed the patient's chart and labs.  Questions were answered to the patient's satisfaction.     Arnoldo Hooker Tamryn Popko

## 2022-07-25 NOTE — Anesthesia Preprocedure Evaluation (Signed)
Anesthesia Evaluation  Patient identified by MRN, date of birth, ID band Patient awake    Reviewed: Allergy & Precautions, H&P , NPO status , Patient's Chart, lab work & pertinent test results  Airway Mallampati: II   Neck ROM: full    Dental   Pulmonary neg pulmonary ROS,    breath sounds clear to auscultation       Cardiovascular negative cardio ROS   Rhythm:regular Rate:Normal     Neuro/Psych PSYCHIATRIC DISORDERS Anxiety    GI/Hepatic   Endo/Other  Morbid obesity  Renal/GU      Musculoskeletal   Abdominal   Peds  Hematology   Anesthesia Other Findings   Reproductive/Obstetrics H/o breast CA                             Anesthesia Physical Anesthesia Plan  ASA: 2  Anesthesia Plan: General   Post-op Pain Management:    Induction: Intravenous  PONV Risk Score and Plan: 3 and Ondansetron, Dexamethasone, Midazolam and Treatment may vary due to age or medical condition  Airway Management Planned: Oral ETT  Additional Equipment:   Intra-op Plan:   Post-operative Plan: Extubation in OR  Informed Consent: I have reviewed the patients History and Physical, chart, labs and discussed the procedure including the risks, benefits and alternatives for the proposed anesthesia with the patient or authorized representative who has indicated his/her understanding and acceptance.     Dental advisory given  Plan Discussed with: CRNA, Anesthesiologist and Surgeon  Anesthesia Plan Comments:         Anesthesia Quick Evaluation

## 2022-07-25 NOTE — Transfer of Care (Signed)
Immediate Anesthesia Transfer of Care Note  Patient: Rhonda Moss  Procedure(s) Performed: PANNICULECTOMY (Abdomen)  Patient Location: PACU  Anesthesia Type:General  Level of Consciousness: awake, alert , oriented, drowsy and patient cooperative  Airway & Oxygen Therapy: Patient Spontanous Breathing and Patient connected to face mask oxygen  Post-op Assessment: Report given to RN and Post -op Vital signs reviewed and stable  Post vital signs: Reviewed and stable  Last Vitals:  Vitals Value Taken Time  BP 126/79 07/25/22 1031  Temp    Pulse 70 07/25/22 1031  Resp 14 07/25/22 1031  SpO2 100 % 07/25/22 1031  Vitals shown include unvalidated device data.  Last Pain:  Vitals:   07/25/22 3953  TempSrc: Oral  PainSc: 0-No pain         Complications: No notable events documented.

## 2022-07-25 NOTE — Anesthesia Procedure Notes (Signed)
Procedure Name: Intubation Date/Time: 07/25/2022 7:24 AM  Performed by: Willa Frater, CRNAPre-anesthesia Checklist: Patient identified, Emergency Drugs available, Suction available and Patient being monitored Patient Re-evaluated:Patient Re-evaluated prior to induction Oxygen Delivery Method: Circle system utilized Preoxygenation: Pre-oxygenation with 100% oxygen Induction Type: IV induction Ventilation: Mask ventilation without difficulty Laryngoscope Size: Mac and 3 Grade View: Grade I Tube type: Oral Number of attempts: 1 Airway Equipment and Method: Stylet and Oral airway Placement Confirmation: ETT inserted through vocal cords under direct vision, positive ETCO2 and breath sounds checked- equal and bilateral Secured at: 22 cm Tube secured with: Tape Dental Injury: Teeth and Oropharynx as per pre-operative assessment

## 2022-07-26 DIAGNOSIS — M793 Panniculitis, unspecified: Secondary | ICD-10-CM | POA: Diagnosis not present

## 2022-07-26 NOTE — Discharge Summary (Signed)
Physician Discharge Summary  Patient ID: Rhonda Moss MRN: [id number] DOB/AGE: 1971/03/01 51 y.o.  Admit date: 07/25/2022 Discharge date: 07/26/2022  Admission Diagnoses: Panniculitis  Discharge Diagnoses:  Principal Problem:   Panniculitis  Discharged Condition: stable  Hospital Course: Post operatively patient tolerated diet, oral pain medication and was ambulatory with minimal assist. Instructed on bathing drain care and activity.  Treatments: surgery: panniculectomy 9.8.23  Discharge Exam: Blood pressure 105/68, pulse 60, temperature 97.7 F (36.5 C), resp. rate 16, height '5\' 7"'$  (1.702 m), weight 97.6 kg, last menstrual period 08/31/2018, SpO2 99 %. Incision/Wound: abdomen soft umbilicus viable incisions intact scnt drainage on dressings drains serosanguinous  Disposition: Discharge disposition: 01-Home or Self Care       Discharge Instructions     Call MD for:  redness, tenderness, or signs of infection (pain, swelling, bleeding, redness, odor or green/yellow discharge around incision site)   Complete by: As directed    Call MD for:  temperature >100.5   Complete by: As directed    Discharge instructions   Complete by: As directed    Ok to remove dressings and shower am 9.10.23. Soap and water ok, pat incisions dry. No creams or ointments over incisions. Do not let drains dangle in shower, attach to lanyard or similar.Strip and record drains twice daily and bring log to clinic visit.  Abdominal binder or compression garment all other times. Do not lie flat. Ambulate bent at hips. Sleep with 2-3 pillows beneath head and pillow beneath knees.  Ok to raise arms above shoulders for bathing and dressing.  No house yard work or exercise until cleared by MD.    Patient received all Rx preop. Recommend ibuprofen with meals to aid with pain control. Recommend Miralax or Dulcolax as needed for constipation.   Driving Restrictions   Complete by: As directed    No driving if  taking prescription pain medication   Lifting restrictions   Complete by: As directed    No lifting > 5-10 lbs until cleared by MD   Nursing communication   Complete by: As directed    Please give am dose Lovenox prior to d/c   Resume previous diet   Complete by: As directed       Allergies as of 07/26/2022       Reactions   Sulfa Antibiotics Hives        Medication List     TAKE these medications    SENNA PO Take by mouth. Senna Leaf   valACYclovir 500 MG tablet Commonly known as: Valtrex Take 1 tablet daily for suppressive therapy.  With symptoms, increase to bid x 3 days.        Follow-up Information     Irene Limbo, MD Follow up in 1 week(s).   Specialty: Plastic Surgery Why: as scheduled Contact information: [address] [facility] [place] [address] [phone]                 Signed: Irene Limbo 07/26/2022, 7:54 AM

## 2022-07-28 ENCOUNTER — Encounter (HOSPITAL_BASED_OUTPATIENT_CLINIC_OR_DEPARTMENT_OTHER): Payer: Self-pay | Admitting: Plastic Surgery

## 2022-11-06 DIAGNOSIS — Z9889 Other specified postprocedural states: Secondary | ICD-10-CM | POA: Diagnosis not present

## 2022-11-25 ENCOUNTER — Ambulatory Visit (INDEPENDENT_AMBULATORY_CARE_PROVIDER_SITE_OTHER): Payer: Medicaid Other | Admitting: Family Medicine

## 2022-11-25 ENCOUNTER — Encounter: Payer: Self-pay | Admitting: Family Medicine

## 2022-11-25 VITALS — BP 124/82 | HR 75 | Temp 98.2°F | Wt 225.4 lb

## 2022-11-25 DIAGNOSIS — R052 Subacute cough: Secondary | ICD-10-CM | POA: Diagnosis not present

## 2022-11-25 DIAGNOSIS — R0982 Postnasal drip: Secondary | ICD-10-CM

## 2022-11-25 MED ORDER — BENZONATATE 200 MG PO CAPS: 200.0000 mg | ORAL_CAPSULE | Freq: Three times a day (TID) | ORAL | 0 refills | Status: AC | PRN

## 2022-11-25 MED ORDER — PROMETHAZINE-DM 6.25-15 MG/5ML PO SYRP: 5.0000 mL | ORAL_SOLUTION | Freq: Four times a day (QID) | ORAL | 0 refills | Status: DC | PRN

## 2022-11-25 MED ORDER — FLUTICASONE PROPIONATE 50 MCG/ACT NA SUSP: 2.0000 | Freq: Every day | NASAL | 6 refills | Status: DC

## 2022-11-25 NOTE — Progress Notes (Signed)
Assessment/Plan:   Problem List Items Addressed This Visit   None Visit Diagnoses     Subacute cough    -  Primary   Relevant Medications   fluticasone (FLONASE) 50 MCG/ACT nasal spray   benzonatate (TESSALON) 200 MG capsule   promethazine-dextromethorphan (PROMETHAZINE-DM) 6.25-15 MG/5ML syrup   Other Relevant Orders   DG Chest 2 View (Completed)   Post-nasal drainage       Relevant Medications   fluticasone (FLONASE) 50 MCG/ACT nasal spray   benzonatate (TESSALON) 200 MG capsule   promethazine-dextromethorphan (PROMETHAZINE-DM) 6.25-15 MG/5ML syrup   Other Relevant Orders   DG Chest 2 View (Completed)      There are no discontinued medications.  Patient has been experiencing a persistent cough for approximately one month, associated with other upper respiratory symptoms including sore throat, mucus production, and nasal congestion. The rapid fluctuation of symptom severity is consistent with a subacute cough, possibly post-viral in nature. Differential diagnoses for a persistent cough include post-viral cough, allergic rhinitis, or possibly undiagnosed asthma; however, only post-viral cough is being pursued at this time based on the clinical picture.  Plan:  Begin treatment with fluticasone (FLONASE) 50 MCG/ACT nasal spray to alleviate nasal congestion and post-nasal drip. Prescribe benzonatate (TESSALON) 200 MG capsules to suppress the cough. Prescribe promethazine-dextromethorphan (PROMETHAZINE-DM) 6.25-15 MG/5ML syrup to address both the suppressive and expectorant needs. Obtain a chest X-ray to exclude the possibility of a lower respiratory infection such as pneumonia that may require antibiotic treatment.   Subjective:   Chief Complaint: Patient presents with a cough persisting for approximately a month accompanied by episodes of worsening symptoms, including loss of voice, sore throat, sneezing, mucus production, chills, and clamminess without fever.  History of  Present Illness:  Problem 1: The patient reports a cough for about one month that fluctuates in severity, with a recent exacerbation over the weekend preceding the visit. The cough is described as productive at times, causing a rattling chest sound, particularly at night. The patient also experiences a sore throat, nasal congestion, and occasional headache, reminiscent of sinus infection symptoms, although no fever has been registered. The patient has been self-medicating with moonshine and Vicks VapoRub to manage symptoms. An ear examination revealed wax but no tenderness or pain, and throat examination found no redness or signs of strep throat. The patient also has a history of lymph node removal due to sinus issues.  Review of Systems:  Respiratory: Subacute cough, periods of worsened symptoms with phlegm production. ENT: Nasal congestion and post-nasal drip. Constitutional: Chills, but no documented fever. All remaining systems reviewed and negative.   Past Surgical History:  Procedure Laterality Date   BREAST BIOPSY Left 03/2015   BREAST LUMPECTOMY Left 06/2015   BREAST LUMPECTOMY WITH RADIOACTIVE SEED AND SENTINEL LYMPH NODE BIOPSY Left 06/22/2015   Procedure: LEFT BREAST LUMPECTOMY WITH RADIOACTIVE SEED AND SENTINEL LYMPH NODE BIOPSY;  Surgeon: Coralie Keens, MD;  Location: [facility];  Service: General;  Laterality: Left;   CERVICAL BIOPSY  W/ [place]  2002   CHOLECYSTECTOMY  1998   CYSTOSCOPY N/A 11/29/2018   Procedure: CYSTOSCOPY;  Surgeon: Megan Salon, MD;  Location: [facility];  Service: Gynecology;  Laterality: N/A;   LYMPHADENECTOMY  1992   single node in right neck   PANNICULECTOMY N/A 07/25/2022   Procedure: PANNICULECTOMY;  Surgeon: Irene Limbo, MD;  Location: [facility];  Service: Plastics;  Laterality: N/A;  SALPINGECTOMY Left 10/2005   STRABISMUS SURGERY  2016   TOTAL  LAPAROSCOPIC HYSTERECTOMY WITH SALPINGECTOMY Bilateral 11/29/2018   Procedure: TOTAL LAPAROSCOPIC HYSTERECTOMY WITH SALPINGECTOMY;  Surgeon: Megan Salon, MD;  Location: [facility];  Service: Gynecology;  Laterality: Bilateral;  Possible BSO. Extended recovery bed needed    Outpatient Medications Prior to Visit  Medication Sig Dispense Refill   SENNA PO Take by mouth. Senna Leaf     ibuprofen (ADVIL) 800 MG tablet Take 1 tablet by mouth every 8 (eight) hours as needed. (Patient not taking: Reported on 11/25/2022)     valACYclovir (VALTREX) 500 MG tablet Take 1 tablet daily for suppressive therapy.  With symptoms, increase to bid x 3 days. (Patient not taking: Reported on 11/25/2022) 30 tablet 3   No facility-administered medications prior to visit.    Family History  Problem Relation Age of Onset   Breast cancer Mother 48   Breast cancer Sister 25   Breast cancer Maternal Aunt 93       +calcifications   Prostate cancer Maternal Uncle 67   Heart attack Maternal Grandmother    Heart attack Maternal Grandfather    Prostate cancer Maternal Grandfather 70   Diabetes Paternal Grandmother    Pancreatitis Paternal Grandfather    Prostate cancer Cousin        dx. late 1s    Social History   Socioeconomic History   Marital status: Married    Spouse name: Not on file   Number of children: 2   Years of education: Not on file   Highest education level: Not on file  Occupational History   Not on file  Tobacco Use   Smoking status: Never   Smokeless tobacco: Never  Vaping Use   Vaping Use: Never used  Substance and Sexual Activity   Alcohol use: Yes    Alcohol/week: 1.0 standard drink of alcohol    Types: 1 Standard drinks or equivalent per week    Comment: social   Drug use: No   Sexual activity: Yes    Birth control/protection: None  Other Topics Concern   Not on file  Social History Narrative   Not on file   Social Determinants of Health   Financial  Resource Strain: Not on file  Food Insecurity: Not on file  Transportation Needs: Not on file  Physical Activity: Not on file  Stress: Not on file  Social Connections: Not on file  Intimate Partner Violence: Not on file                                                                                                 Objective:  Physical Exam: BP 124/82 (BP Location: Left Arm, Patient Position: Sitting, Cuff Size: Large)   Pulse 75   Temp 98.2 F (36.8 C) (Oral)   Wt 225 lb 6.4 oz (102.2 kg)   LMP 08/31/2018 (Approximate)   SpO2 98%   BMI 35.30 kg/m    General: No acute distress. Awake and conversant.  Eyes: Normal conjunctiva, anicteric. Round symmetric pupils.  ENT: Hearing grossly intact. No nasal  discharge.  Neck: Neck is supple. No masses or thyromegaly.  Respiratory: Respirations are non-labored. No auditory wheezing.  CTAB Skin: Warm. No rashes or ulcers.  Psych: Alert and oriented. Cooperative, Appropriate mood and affect, Normal judgment.  CV: No cyanosis or JVD MSK: Normal ambulation. No clubbing  Neuro: Sensation and CN II-XII grossly normal.        Alesia Banda, MD, MS

## 2022-11-25 NOTE — Patient Instructions (Addendum)
Please be sure to drink plenty of fluids.   For chest xray, go to:    [facility], [address], [place], [place] [address] Phone: [phone]   You may take the following OTC medications to help with symptoms:  For cough, use  cough syrups or other cough suppressants.  For headache, sore throat, fevers, muscle aches, chills, other pain, take ibuprofen or tylenol  For congestion, use nasal sprays, decongestants, or antihistamines  Please follow up if no improvement.   Go to ED if you have severe chest pain, fevers, shortness of breath or other worrisome symptoms.

## 2022-11-26 ENCOUNTER — Ambulatory Visit (INDEPENDENT_AMBULATORY_CARE_PROVIDER_SITE_OTHER)
Admission: RE | Admit: 2022-11-26 | Discharge: 2022-11-26 | Disposition: A | Discharge status: Home / Self Care | Payer: Medicaid Other | Source: Ambulatory Visit | Attending: Family Medicine | Admitting: Family Medicine

## 2022-11-26 DIAGNOSIS — R0982 Postnasal drip: Secondary | ICD-10-CM

## 2022-11-26 DIAGNOSIS — R052 Subacute cough: Secondary | ICD-10-CM | POA: Diagnosis not present

## 2022-11-26 DIAGNOSIS — R059 Cough, unspecified: Secondary | ICD-10-CM | POA: Diagnosis not present

## 2022-11-27 ENCOUNTER — Telehealth: Payer: Self-pay | Admitting: Family Medicine

## 2022-11-27 NOTE — Telephone Encounter (Signed)
Will call patient once chest x-ray has been reviewed.

## 2022-11-27 NOTE — Telephone Encounter (Signed)
Advised patient that we haven't received the results, but when we do we will contact her.

## 2022-11-27 NOTE — Telephone Encounter (Signed)
Pt called to know about her x-ray results

## 2022-11-27 NOTE — Telephone Encounter (Signed)
Caller Name: [place] Call back phone #: [phone]   Reason for Call: Please call with chest x-ray results

## 2023-03-17 ENCOUNTER — Ambulatory Visit (INDEPENDENT_AMBULATORY_CARE_PROVIDER_SITE_OTHER): Payer: Medicaid Other | Admitting: Family Medicine

## 2023-03-17 ENCOUNTER — Encounter: Payer: Self-pay | Admitting: Family Medicine

## 2023-03-17 VITALS — BP 132/84 | HR 80 | Temp 97.6°F | Ht 67.0 in | Wt 228.8 lb

## 2023-03-17 DIAGNOSIS — Z1211 Encounter for screening for malignant neoplasm of colon: Secondary | ICD-10-CM | POA: Diagnosis not present

## 2023-03-17 DIAGNOSIS — E559 Vitamin D deficiency, unspecified: Secondary | ICD-10-CM | POA: Diagnosis not present

## 2023-03-17 DIAGNOSIS — Z862 Personal history of diseases of the blood and blood-forming organs and certain disorders involving the immune mechanism: Secondary | ICD-10-CM | POA: Diagnosis not present

## 2023-03-17 DIAGNOSIS — Z0001 Encounter for general adult medical examination with abnormal findings: Secondary | ICD-10-CM | POA: Diagnosis not present

## 2023-03-17 DIAGNOSIS — D509 Iron deficiency anemia, unspecified: Secondary | ICD-10-CM

## 2023-03-17 DIAGNOSIS — E785 Hyperlipidemia, unspecified: Secondary | ICD-10-CM | POA: Diagnosis not present

## 2023-03-17 DIAGNOSIS — K5904 Chronic idiopathic constipation: Secondary | ICD-10-CM

## 2023-03-17 DIAGNOSIS — Z0000 Encounter for general adult medical examination without abnormal findings: Secondary | ICD-10-CM

## 2023-03-17 DIAGNOSIS — Z23 Encounter for immunization: Secondary | ICD-10-CM | POA: Diagnosis not present

## 2023-03-17 DIAGNOSIS — E669 Obesity, unspecified: Secondary | ICD-10-CM

## 2023-03-17 DIAGNOSIS — R238 Other skin changes: Secondary | ICD-10-CM | POA: Insufficient documentation

## 2023-03-17 DIAGNOSIS — N1831 Chronic kidney disease, stage 3a: Secondary | ICD-10-CM

## 2023-03-17 LAB — CBC WITH DIFFERENTIAL/PLATELET
Basophils Absolute: 0 10*3/uL (ref 0.0–0.1)
Basophils Relative: 0.3 % (ref 0.0–3.0)
Eosinophils Absolute: 0 10*3/uL (ref 0.0–0.7)
Eosinophils Relative: 0.6 % (ref 0.0–5.0)
HCT: 36.5 % (ref 36.0–46.0)
Hemoglobin: 12.3 g/dL (ref 12.0–15.0)
Lymphocytes Relative: 36.7 % (ref 12.0–46.0)
Lymphs Abs: 1.7 10*3/uL (ref 0.7–4.0)
MCHC: 33.8 g/dL (ref 30.0–36.0)
MCV: 95.1 fl (ref 78.0–100.0)
Monocytes Absolute: 0.2 10*3/uL (ref 0.1–1.0)
Monocytes Relative: 4.1 % (ref 3.0–12.0)
Neutro Abs: 2.7 10*3/uL (ref 1.4–7.7)
Neutrophils Relative %: 58.3 % (ref 43.0–77.0)
Platelets: 261 10*3/uL (ref 150.0–400.0)
RBC: 3.84 Mil/uL — ABNORMAL LOW (ref 3.87–5.11)
RDW: 13.2 % (ref 11.5–15.5)
WBC: 4.6 10*3/uL (ref 4.0–10.5)

## 2023-03-17 LAB — URINALYSIS, ROUTINE W REFLEX MICROSCOPIC
Bilirubin Urine: NEGATIVE
Hgb urine dipstick: NEGATIVE
Ketones, ur: NEGATIVE
Leukocytes,Ua: NEGATIVE
Nitrite: NEGATIVE
RBC / HPF: NONE SEEN (ref 0–?)
Specific Gravity, Urine: <=1.005 — AB (ref 1.000–1.030)
Total Protein, Urine: NEGATIVE
Urine Glucose: NEGATIVE
Urobilinogen, UA: 0.2 (ref 0.0–1.0)
pH: 6 (ref 5.0–8.0)

## 2023-03-17 LAB — MICROALBUMIN / CREATININE URINE RATIO
Creatinine,U: 50.2 mg/dL
Microalb Creat Ratio: 1.4 mg/g (ref 0.0–30.0)
Microalb, Ur: <0.7 mg/dL (ref 0.0–1.9)

## 2023-03-17 LAB — COMPREHENSIVE METABOLIC PANEL WITH GFR
ALT: 11 U/L (ref 0–35)
AST: 18 U/L (ref 0–37)
Albumin: 4.1 g/dL (ref 3.5–5.2)
Alkaline Phosphatase: 75 U/L (ref 39–117)
BUN: 14 mg/dL (ref 6–23)
CO2: 27 meq/L (ref 19–32)
Calcium: 9.6 mg/dL (ref 8.4–10.5)
Chloride: 105 meq/L (ref 96–112)
Creatinine, Ser: 1.07 mg/dL (ref 0.40–1.20)
GFR: 59.88 mL/min — ABNORMAL LOW
Glucose, Bld: 89 mg/dL (ref 70–99)
Potassium: 4.2 meq/L (ref 3.5–5.1)
Sodium: 140 meq/L (ref 135–145)
Total Bilirubin: 0.7 mg/dL (ref 0.2–1.2)
Total Protein: 6.9 g/dL (ref 6.0–8.3)

## 2023-03-17 LAB — LIPID PANEL
Cholesterol: 191 mg/dL (ref 0–200)
HDL: 63.7 mg/dL
LDL Cholesterol: 110 mg/dL — ABNORMAL HIGH (ref 0–99)
NonHDL: 127.1
Total CHOL/HDL Ratio: 3
Triglycerides: 84 mg/dL (ref 0.0–149.0)
VLDL: 16.8 mg/dL (ref 0.0–40.0)

## 2023-03-17 LAB — TSH: TSH: 0.8 u[IU]/mL (ref 0.35–5.50)

## 2023-03-17 LAB — HEMOGLOBIN A1C: Hgb A1c MFr Bld: 5.5 % (ref 4.6–6.5)

## 2023-03-17 MED ORDER — POLYETHYLENE GLYCOL 3350 17 GM/SCOOP PO POWD: 17.0000 g | Freq: Every day | ORAL | 0 refills | Status: AC

## 2023-03-17 NOTE — Patient Instructions (Signed)
We are checking labs and placing referrals as discussed.

## 2023-03-17 NOTE — Assessment & Plan Note (Signed)
Previous LDL cholesterol readings at 109 and 112 mg/dL. Plan: Continue healthy diet, increase physical activity. Recheck lipid panel to assess current status.

## 2023-03-17 NOTE — Assessment & Plan Note (Signed)
Encouragement to continue with current exercise regimen and dietary habits. Consider nutritional counseling for further support.

## 2023-03-17 NOTE — Assessment & Plan Note (Signed)
Patient has a history of constipation relieved by MiraLAX.  Plan: Encourage increased fiber intake, hydration, potential use of probiotics.  Consider referral to Gastroenterology for further evaluation and management.

## 2023-03-17 NOTE — Assessment & Plan Note (Signed)
No anemia noted in the CBC a year ago. Plan: Recheck hemoglobin and CBC to ensure no recurrence of anemia.

## 2023-03-17 NOTE — Progress Notes (Signed)
Assessment  Assessment/Plan:   Problem List Items Addressed This Visit       Digestive   Chronic idiopathic constipation    Patient has a history of constipation relieved by MiraLAX.  Plan: Encourage increased fiber intake, hydration, potential use of probiotics.  Consider referral to Gastroenterology for further evaluation and management.      Relevant Medications   polyethylene glycol powder (GLYCOLAX/MIRALAX) 17 GM/SCOOP powder   Other Relevant Orders   Ambulatory referral to Gastroenterology     Other   IDA (iron deficiency anemia)    No anemia noted in the CBC a year ago. Plan: Recheck hemoglobin and CBC to ensure no recurrence of anemia.      Hyperlipidemia    Previous LDL cholesterol readings at 109 and 112 mg/dL. Plan: Continue healthy diet, increase physical activity. Recheck lipid panel to assess current status.      Relevant Orders   TSH   Lipid panel   Hemoglobin A1c   Microalbumin / creatinine urine ratio   Urinalysis, Routine w reflex microscopic   CBC with Differential/Platelet   Comprehensive metabolic panel   Obesity    Encouragement to continue with current exercise regimen and dietary habits. Consider nutritional counseling for further support.      Relevant Orders   TSH   Lipid panel   Hemoglobin A1c   Microalbumin / creatinine urine ratio   Urinalysis, Routine w reflex microscopic   CBC with Differential/Platelet   Comprehensive metabolic panel   Skin irritation due to excess skin - Primary    Patient reports rashes and pain where the bra strap sits and folds of can rub together. Plan: Refer to Plastic Surgery for evaluation of skin irritation possibly related to previous surgery.      Relevant Orders   Ambulatory referral to Plastic Surgery   Other Visit Diagnoses     Encounter for well adult exam without abnormal findings       Immunization due       Screening for colon cancer       Relevant Orders   Cologuard   Vitamin D  deficiency       Relevant Orders   Vitamin D 1,25 dihydroxy   History of anemia       Relevant Orders   CBC with Differential/Platelet       There are no discontinued medications.  Patient Counseling(The following topics were reviewed and/or handout was given):  -Nutrition: Stressed importance of moderation in sodium/caffeine intake, saturated fat and cholesterol, caloric balance, sufficient intake of fresh fruits, vegetables, and fiber.  -Stressed the importance of regular exercise.   -Substance Abuse: Discussed cessation/primary prevention of tobacco, alcohol, or other drug use; driving or other dangerous activities under the influence; availability of treatment for abuse.   -Injury prevention: Discussed safety belts, safety helmets, smoke detector, smoking near bedding or upholstery.   -Sexuality: Discussed sexually transmitted diseases, partner selection, use of condoms, avoidance of unintended pregnancy and contraceptive alternatives.   -Dental health: Discussed importance of regular tooth brushing, flossing, and dental visits.  -Health maintenance and immunizations reviewed. Please refer to Health maintenance section.  Return to care in 1 year for next preventative visit.       Subjective:  Chief complaint Encounter date: 03/17/2023  Chief Complaint  Patient presents with   Annual Exam    Non fasting . Rashes and back pain where bra strap sits.    Rhonda Moss is a 52 y.o. female who presents  today for her annual comprehensive physical exam.    History of Present Illness:  Skin irritation: Patient complains of skin irritation, under the area where the bra strap sits, stemming from excess skin potentially related to panniculectomy. Reports some fatigue but no signs of anemia on previous CBC.  Constipation: Patient also reports chronic idiopathic constipation, managed with MiraLAX, seeking alternative options including a review of lifestyle modifications. Shared a  history of using probiotics recently.  Lifestyle:   Diet: Fasts until noon, breaks fast with fruit, takes greens, smoothies for lunch, and generally maintains a balanced diet. Snack preference is fruit.   Exercise: Engages in workouts 4-5 days a week, including early morning sessions.  Review of Systems  Constitutional:  Negative for chills and fever.  Gastrointestinal:  Positive for constipation.  Skin:  Positive for rash.  All other systems reviewed and are negative.      03/17/2023   10:43 AM  GAD-7 Generalized Anxiety Disorder Screening Tool  1. Feeling Nervous, Anxious, or on Edge 1  2. Not Being Able to Stop or Control Worrying 0  3. Worrying Too Much About Different Things 1  4. Trouble Relaxing 1  5. Being So Restless it's Hard To Sit Still 3  6. Becoming Easily Annoyed or Irritable 0  7. Feeling Afraid As If Something Awful Might Happen 0  Total GAD-7 Score 6  Difficulty At Work, Home, or Getting  Along With Others? Somewhat difficult      03/17/2023   10:42 AM 03/13/2022    9:56 AM 07/05/2020    8:28 AM 05/03/2015    9:20 AM  Depression screen PHQ 2/9  Decreased Interest 0 0 0 0  Down, Depressed, Hopeless 0 0 0 0  PHQ - 2 Score 0 0 0 0  Altered sleeping 1     Tired, decreased energy 2     Change in appetite 0     Feeling bad or failure about yourself  0     Trouble concentrating 0     Moving slowly or fidgety/restless 0     Suicidal thoughts 0     PHQ-9 Score 3     Difficult doing work/chores Not difficult at all       Health Maintenance Due  Topic Date Due   DTaP/Tdap/Td (1 - Tdap) Never done   Zoster Vaccines- Shingrix (1 of 2) Never done   Fecal DNA (Cologuard)  02/03/2023    PMH:  The following were reviewed and entered/updated in epic: Past Medical History:  Diagnosis Date   Allergy    Anxiety    Breast cancer (HCC) dx. 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   Panniculitis 07/25/2022   Personal history of radiation therapy     Patient Active Problem List   Diagnosis Date Noted   Skin irritation due to excess skin 03/17/2023   Chronic idiopathic constipation 03/17/2023   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   History of breast cancer 07/11/2022   Hyperlipidemia 03/13/2022   Obesity 03/13/2022   IDA (iron deficiency anemia) 11/29/2018    Past Surgical History:  Procedure Laterality  Date   BREAST BIOPSY Left 03/2015   BREAST LUMPECTOMY Left 06/2015   BREAST LUMPECTOMY WITH RADIOACTIVE SEED AND SENTINEL LYMPH NODE BIOPSY Left 06/22/2015   Procedure: LEFT BREAST LUMPECTOMY WITH RADIOACTIVE SEED AND SENTINEL LYMPH NODE BIOPSY;  Surgeon: Abigail Miyamoto, MD;  Location: [facility];  Service: General;  Laterality: Left;   CERVICAL BIOPSY  W/ LOOP ELECTRODE EXCISION  1991   CESAREAN SECTION  2002   CHOLECYSTECTOMY  1998   CYSTOSCOPY N/A 11/29/2018   Procedure: CYSTOSCOPY;  Surgeon: Jerene Bears, MD;  Location: [facility];  Service: Gynecology;  Laterality: N/A;   LYMPHADENECTOMY  1992   single node in right neck   PANNICULECTOMY N/A 07/25/2022   Procedure: PANNICULECTOMY;  Surgeon: Glenna Fellows, MD;  Location: [facility];  Service: Plastics;  Laterality: N/A;   SALPINGECTOMY Left 10/2005   STRABISMUS SURGERY  2016   TOTAL LAPAROSCOPIC HYSTERECTOMY WITH SALPINGECTOMY Bilateral 11/29/2018   Procedure: TOTAL LAPAROSCOPIC HYSTERECTOMY WITH SALPINGECTOMY;  Surgeon: Jerene Bears, MD;  Location: [facility];  Service: Gynecology;  Laterality: Bilateral;  Possible BSO. Extended recovery bed needed    Family History  Problem  Relation Age of Onset   Breast cancer Mother 66   Breast cancer Sister 56   Breast cancer Maternal Aunt 60       +calcifications   Prostate cancer Maternal Uncle 67   Heart attack Maternal Grandmother    Heart attack Maternal Grandfather    Prostate cancer Maternal Grandfather 26   Diabetes Paternal Grandmother    Pancreatitis Paternal Grandfather    Prostate cancer Cousin        dx. late 62s    Medications- reviewed and updated Outpatient Medications Prior to Visit  Medication Sig Dispense Refill   SENNA PO Take by mouth. Senna Leaf     valACYclovir (VALTREX) 500 MG tablet Take 1 tablet daily for suppressive therapy.  With symptoms, increase to bid x 3 days. 30 tablet 3   fluticasone (FLONASE) 50 MCG/ACT nasal spray Place 2 sprays into both nostrils daily. (Patient not taking: Reported on 03/17/2023) 16 g 6   ibuprofen (ADVIL) 800 MG tablet Take 1 tablet by mouth every 8 (eight) hours as needed. (Patient not taking: Reported on 11/25/2022)     promethazine-dextromethorphan (PROMETHAZINE-DM) 6.25-15 MG/5ML syrup Take 5 mLs by mouth 4 (four) times daily as needed for cough. (Patient not taking: Reported on 03/17/2023) 118 mL 0   No facility-administered medications prior to visit.    Allergies  Allergen Reactions   Sulfa Antibiotics Hives    Social History   Socioeconomic History   Marital status: Married    Spouse name: Not on file   Number of children: 2   Years of education: Not on file   Highest education level: Not on file  Occupational History   Not on file  Tobacco Use   Smoking status: Never    Passive exposure: Never   Smokeless tobacco: Never  Vaping Use   Vaping Use: Never used  Substance and Sexual Activity   Alcohol use: Yes    Alcohol/week: 1.0 standard drink of alcohol    Types: 1 Standard drinks or equivalent per week    Comment: social   Drug use: No   Sexual activity: Yes    Birth control/protection: None  Other Topics Concern   Not on file   Social History Narrative   Not on file   Social Determinants of Health  Financial Resource Strain: Not on file  Food Insecurity: Not on file  Transportation Needs: Not on file  Physical Activity: Not on file  Stress: Not on file  Social Connections: Not on file        Objective:  Physical Exam: BP 132/84 (BP Location: Right Arm, Patient Position: Sitting, Cuff Size: Large)   Pulse 80   Temp 97.6 F (36.4 C) (Temporal)   Ht 5\' 7"  (1.702 m)   Wt 228 lb 12.8 oz (103.8 kg)   LMP 08/31/2018 (Approximate)   SpO2 99%   BMI 35.84 kg/m   Body mass index is 35.84 kg/m. Wt Readings from Last 3 Encounters:  03/17/23 228 lb 12.8 oz (103.8 kg)  11/25/22 225 lb 6.4 oz (102.2 kg)  07/25/22 215 lb 2.7 oz (97.6 kg)    Physical Exam Constitutional:      General: She is not in acute distress.    Appearance: Normal appearance. She is not ill-appearing or toxic-appearing.  HENT:     Head: Normocephalic.     Right Ear: Tympanic membrane, ear canal and external ear normal. There is no impacted cerumen.     Left Ear: Tympanic membrane, ear canal and external ear normal. There is no impacted cerumen.     Nose: Nose normal. No congestion.     Mouth/Throat:     Mouth: Mucous membranes are moist.     Pharynx: Oropharynx is clear. No oropharyngeal exudate.  Eyes:     General: No scleral icterus.       Right eye: No discharge.        Left eye: No discharge.     Conjunctiva/sclera: Conjunctivae normal.     Pupils: Pupils are equal, round, and reactive to light.  Cardiovascular:     Rate and Rhythm: Normal rate and regular rhythm.     Pulses: Normal pulses.     Heart sounds: Normal heart sounds.  Pulmonary:     Effort: Pulmonary effort is normal. No respiratory distress.     Breath sounds: Normal breath sounds.  Abdominal:     General: Abdomen is flat. Bowel sounds are normal.     Palpations: Abdomen is soft.  Musculoskeletal:        General: Normal range of motion.     Cervical  back: Normal range of motion.  Lymphadenopathy:     Cervical: No cervical adenopathy.  Skin:    General: Skin is warm and dry.     Findings: No rash.  Neurological:     General: No focal deficit present.     Mental Status: She is alert and oriented to person, place, and time. Mental status is at baseline.  Psychiatric:        Mood and Affect: Mood normal.        Behavior: Behavior normal.        Thought Content: Thought content normal.        Judgment: Judgment normal.         At today's visit, we discussed treatment options, associated risk and benefits, and engage in counseling as needed.  Additionally the following were reviewed: Past medical records, past medical and surgical history, family and social background, as well as relevant laboratory results, imaging findings, and specialty notes, where applicable.  This message was generated using dictation software, and as a result, it may contain unintentional typos or errors.  Nevertheless, extensive effort was made to accurately convey at the pertinent aspects of the patient visit.  There may have been are other unrelated non-urgent complaints, but due to the busy schedule and the amount of time already spent with her, time does not permit to address these issues at today's visit. Another appointment may have or has been requested to review these additional issues.   Marny Lowenstein, MD, MS

## 2023-03-17 NOTE — Assessment & Plan Note (Signed)
Patient reports rashes and pain where the bra strap sits and folds of can rub together. Plan: Refer to Plastic Surgery for evaluation of skin irritation possibly related to previous surgery.

## 2023-03-18 DIAGNOSIS — N1831 Chronic kidney disease, stage 3a: Secondary | ICD-10-CM | POA: Insufficient documentation

## 2023-03-20 LAB — VITAMIN D 1,25 DIHYDROXY
Vitamin D 1, 25 (OH)2 Total: 38 pg/mL (ref 18–72)
Vitamin D2 1, 25 (OH)2: <8 pg/mL
Vitamin D3 1, 25 (OH)2: 38 pg/mL

## 2023-04-03 LAB — COLOGUARD

## 2023-04-27 ENCOUNTER — Encounter: Payer: Self-pay | Admitting: Family Medicine

## 2023-04-27 ENCOUNTER — Ambulatory Visit (INDEPENDENT_AMBULATORY_CARE_PROVIDER_SITE_OTHER): Payer: Medicaid Other | Admitting: Family Medicine

## 2023-04-27 VITALS — BP 122/80 | HR 90 | Temp 97.8°F | Wt 221.6 lb

## 2023-04-27 DIAGNOSIS — R238 Other skin changes: Secondary | ICD-10-CM

## 2023-04-27 DIAGNOSIS — N1831 Chronic kidney disease, stage 3a: Secondary | ICD-10-CM

## 2023-04-27 NOTE — Progress Notes (Unsigned)
Assessment/Plan:   Problem List Items Addressed This Visit   None   There are no discontinued medications.  No follow-ups on file.    Subjective:   Encounter date: 04/27/2023  Rhonda Moss is a 52 y.o. female who has IDA (iron deficiency anemia); Hyperlipidemia; Obesity; HSV-2 infection; History of breast cancer; Skin irritation due to excess skin; Chronic idiopathic constipation; and Stage 3a chronic kidney disease (HCC) on their problem list..   She  has a past medical history of Allergy, Anxiety, Breast cancer (HCC) (dx. 44 04/11/15), Ectopic pregnancy, Family history of breast cancer, Family history of breast cancer, Family history of prostate cancer, H/O: hysterectomy (07/11/2022), History of loop electrical excision procedure (LEEP) (07/11/2022), Low libido (03/13/2022), Malignant neoplasm of upper-outer quadrant of left breast in female, estrogen receptor positive (HCC) (04/29/2015), Panniculitis (07/25/2022), and Personal history of radiation therapy..   She presents with chief complaint of Discuss paperwork (Dx with stage 3 Kidney disease. Needs paperwork for [facility]. ) .   HPI:   ROS  Past Surgical History:  Procedure Laterality Date   BREAST BIOPSY Left 03/2015   BREAST LUMPECTOMY Left 06/2015   BREAST LUMPECTOMY WITH RADIOACTIVE SEED AND SENTINEL LYMPH NODE BIOPSY Left 06/22/2015   Procedure: LEFT BREAST LUMPECTOMY WITH RADIOACTIVE SEED AND SENTINEL LYMPH NODE BIOPSY;  Surgeon: Abigail Miyamoto, MD;  Location: [facility];  Service: General;  Laterality: Left;   CERVICAL BIOPSY  W/ LOOP ELECTRODE EXCISION  1991   CESAREAN SECTION  2002   CHOLECYSTECTOMY  1998   CYSTOSCOPY N/A 11/29/2018   Procedure: CYSTOSCOPY;  Surgeon: Jerene Bears, MD;  Location[facility];  Service: Gynecology;  Laterality: N/A;   LYMPHADENECTOMY  1992   single node in right neck   PANNICULECTOMY N/A 07/25/2022   Procedure: PANNICULECTOMY;  Surgeon: Glenna Fellows, MD;  Location: [facility];  Service: Plastics;  Laterality: N/A;   SALPINGECTOMY Left 10/2005   STRABISMUS SURGERY  2016   TOTAL LAPAROSCOPIC HYSTERECTOMY WITH SALPINGECTOMY Bilateral 11/29/2018   Procedure: TOTAL LAPAROSCOPIC HYSTERECTOMY WITH SALPINGECTOMY;  Surgeon: Jerene Bears, MD;  Location: [facility];  Service: Gynecology;  Laterality: Bilateral;  Possible BSO. Extended recovery bed needed    Outpatient Medications Prior to Visit  Medication Sig Dispense Refill   fluticasone (FLONASE) 50 MCG/ACT nasal spray Place 2 sprays into both nostrils daily. (Patient not taking: Reported on 03/17/2023) 16 g 6   ibuprofen (ADVIL) 800 MG tablet Take 1 tablet by mouth every 8 (eight) hours as needed. (Patient not taking: Reported on 11/25/2022)     polyethylene glycol powder (GLYCOLAX/MIRALAX) 17 GM/SCOOP powder Take 17 g by mouth daily. 500 g 0   promethazine-dextromethorphan (PROMETHAZINE-DM) 6.25-15 MG/5ML syrup Take 5 mLs by mouth 4 (four) times daily as needed for cough. (Patient not taking: Reported on 03/17/2023) 118 mL 0   SENNA PO Take by mouth. Senna Leaf     valACYclovir (VALTREX) 500 MG tablet Take 1 tablet daily for suppressive therapy.  With symptoms, increase to bid x 3 days. 30 tablet 3   No facility-administered medications prior to visit.    Family History  Problem Relation Age of Onset   Breast cancer Mother 26   Breast cancer Sister 58   Breast cancer Maternal Aunt 60       +calcifications   Prostate cancer Maternal Uncle 67   Heart attack Maternal Grandmother    Heart attack Maternal Grandfather    Prostate cancer  Maternal Grandfather 29   Diabetes Paternal Grandmother    Pancreatitis Paternal Grandfather    Prostate cancer Cousin        dx. late 54s    Social History   Socioeconomic History   Marital status: Married    Spouse name: Not on file   Number of children: 2   Years of education: Not on file   Highest education  level: Not on file  Occupational History   Not on file  Tobacco Use   Smoking status: Never    Passive exposure: Never   Smokeless tobacco: Never  Vaping Use   Vaping Use: Never used  Substance and Sexual Activity   Alcohol use: Yes    Alcohol/week: 1.0 standard drink of alcohol    Types: 1 Standard drinks or equivalent per week    Comment: social   Drug use: No   Sexual activity: Yes    Birth control/protection: None  Other Topics Concern   Not on file  Social History Narrative   Not on file   Social Determinants of Health   Financial Resource Strain: Not on file  Food Insecurity: Not on file  Transportation Needs: Not on file  Physical Activity: Not on file  Stress: Not on file  Social Connections: Not on file  Intimate Partner Violence: Not on file                                                                                                  Objective:  Physical Exam: LMP 08/31/2018 (Approximate)     Physical Exam  No results found.  Recent Results (from the past 2160 hour(s))  TSH     Status: None   Collection Time: 03/17/23 10:41 AM  Result Value Ref Range   TSH 0.80 0.35 - 5.50 uIU/mL  Lipid panel     Status: Abnormal   Collection Time: 03/17/23 10:41 AM  Result Value Ref Range   Cholesterol 191 0 - 200 mg/dL    Comment: ATP III Classification       Desirable:  < 200 mg/dL               Borderline High:  200 - 239 mg/dL          High:  > = 474 mg/dL   Triglycerides 25.9 0.0 - 149.0 mg/dL    Comment: Normal:  <563 mg/dLBorderline High:  150 - 199 mg/dL   HDL 87.56 >43.32 mg/dL   VLDL 95.1 0.0 - 88.4 mg/dL   LDL Cholesterol 166 (H) 0 - 99 mg/dL   Total CHOL/HDL Ratio 3     Comment:                Men          Women1/2 Average Risk     3.4          3.3Average Risk          5.0          4.42X Average Risk          9.6  7.13X Average Risk          15.0          11.0                       NonHDL 127.10     Comment: NOTE:  Non-HDL goal should be  30 mg/dL higher than patient's LDL goal (i.e. LDL goal of < 70 mg/dL, would have non-HDL goal of < 100 mg/dL)  Hemoglobin Z6X     Status: None   Collection Time: 03/17/23 10:41 AM  Result Value Ref Range   Hgb A1c MFr Bld 5.5 4.6 - 6.5 %    Comment: Glycemic Control Guidelines for People with Diabetes:Non Diabetic:  <6%Goal of Therapy: <7%Additional Action Suggested:  >8%   Vitamin D 1,25 dihydroxy     Status: None   Collection Time: 03/17/23 10:41 AM  Result Value Ref Range   Vitamin D 1, 25 (OH)2 Total 38 18 - 72 pg/mL   Vitamin D3 1, 25 (OH)2 38 pg/mL   Vitamin D2 1, 25 (OH)2 <8 pg/mL    Comment: (Note) Vitamin D3, 1,25(OH)2 indicates both endogenous  production and supplementation. Vitamin D2, 1,25(OH)2 is  an indicator of exogenous sources, such as diet or  supplementation. Interpretation and therapy are based on  measurement of Vitamin D, 1,25 (OH)2, Total. . This test was developed, and its analytical performance  characteristics have been determined by [employer]. It has not been cleared or approved by the  FDA. This assay has been validated pursuant to the CLIA  regulations and is used for clinical purposes. . For additional information, please refer to http://education.[employer].com/faq/FAQ199 (This link is being provided for  informational/educational purposes only.) . [facility] [address] [place] [address] [phone] Junita Push L. Omarrion Carmer Caul, MD, PhD   CBC with Differential/Platelet     Status: Abnormal   Collection Time: 03/17/23 10:41 AM  Result Value Ref Range   WBC 4.6 4.0 - 10.5 K/uL   RBC 3.84 (L) 3.87 - 5.11 Mil/uL   Hemoglobin 12.3 12.0 - 15.0 g/dL   HCT 78.2 95.6 - 21.3 %   MCV 95.1 78.0 - 100.0 fl   MCHC 33.8 30.0 - 36.0 g/dL   RDW 08.6 57.8 - 46.9 %   Platelets 261.0 150.0 - 400.0 K/uL   Neutrophils Relative % 58.3 43.0 - 77.0 %   Lymphocytes Relative 36.7 12.0 - 46.0 %   Monocytes Relative 4.1  3.0 - 12.0 %   Eosinophils Relative 0.6 0.0 - 5.0 %   Basophils Relative 0.3 0.0 - 3.0 %   Neutro Abs 2.7 1.4 - 7.7 K/uL   Lymphs Abs 1.7 0.7 - 4.0 K/uL   Monocytes Absolute 0.2 0.1 - 1.0 K/uL   Eosinophils Absolute 0.0 0.0 - 0.7 K/uL   Basophils Absolute 0.0 0.0 - 0.1 K/uL  Comprehensive metabolic panel     Status: Abnormal   Collection Time: 03/17/23 10:41 AM  Result Value Ref Range   Sodium 140 135 - 145 mEq/L   Potassium 4.2 3.5 - 5.1 mEq/L   Chloride 105 96 - 112 mEq/L   CO2 27 19 - 32 mEq/L   Glucose, Bld 89 70 - 99 mg/dL   BUN 14 6 - 23 mg/dL   Creatinine, Ser 6.29 0.40 - 1.20 mg/dL   Total Bilirubin 0.7 0.2 - 1.2 mg/dL   Alkaline Phosphatase 75 39 - 117 U/L   AST 18  0 - 37 U/L   ALT 11 0 - 35 U/L   Total Protein 6.9 6.0 - 8.3 g/dL   Albumin 4.1 3.5 - 5.2 g/dL   GFR 16.10 (L) >96.04 mL/min    Comment: Calculated using the CKD-EPI Creatinine Equation (2021)   Calcium 9.6 8.4 - 10.5 mg/dL  Microalbumin / creatinine urine ratio     Status: None   Collection Time: 03/17/23 10:57 AM  Result Value Ref Range   Microalb, Ur <0.7 0.0 - 1.9 mg/dL   Creatinine,U 54.0 mg/dL   Microalb Creat Ratio 1.4 0.0 - 30.0 mg/g  Urinalysis, Routine w reflex microscopic     Status: Abnormal   Collection Time: 03/17/23 10:57 AM  Result Value Ref Range   Color, Urine YELLOW Yellow;Lt. Yellow;Straw;Dark Yellow;Amber;Green;Red;Brown   APPearance CLEAR Clear;Turbid;Slightly Cloudy;Cloudy   Specific Gravity, Urine <=1.005 (A) 1.000 - 1.030   pH 6.0 5.0 - 8.0   Total Protein, Urine NEGATIVE Negative   Urine Glucose NEGATIVE Negative   Ketones, ur NEGATIVE Negative   Bilirubin Urine NEGATIVE Negative   Hgb urine dipstick NEGATIVE Negative   Urobilinogen, UA 0.2 0.0 - 1.0   Leukocytes,Ua NEGATIVE Negative   Nitrite NEGATIVE Negative   WBC, UA 0-2/hpf 0-2/hpf   RBC / HPF none seen 0-2/hpf   Squamous Epithelial / HPF Rare(0-4/hpf) Rare(0-4/hpf)  Cologuard     Status: None   Collection Time:  04/03/23 10:20 AM  Result Value Ref Range   COLOGUARD Sample Could Not Be Processed 9 N/A    Comment: An empty collection kit was received in the laboratory. The patient will be contacted to initiate a new sample collection.        Garner Nash, MD, MS

## 2023-04-28 ENCOUNTER — Telehealth: Payer: Self-pay | Admitting: Family Medicine

## 2023-04-28 ENCOUNTER — Encounter: Payer: Self-pay | Admitting: Family Medicine

## 2023-04-28 NOTE — Telephone Encounter (Signed)
Kendal Hymen batten from [employer] would like for you to give her a call @ [phone]

## 2023-04-28 NOTE — Telephone Encounter (Signed)
Caller Name: Kendal Hymen w/[employer] Call back phone #: [phone]  Fax# [phone]  Reason for Call: Kendal Hymen states pt is wanting surgery and they need document of medical necessity. Currently it is seen as a cosmetic procedure. Kendal Hymen confirmed info needing to match pt msg we received today. Please faxed updated/additional documentation to fax above.

## 2023-04-28 NOTE — Telephone Encounter (Addendum)
Left Rhonda Moss a detailed voice message to return call to office.

## 2023-04-29 NOTE — Assessment & Plan Note (Signed)
Plan:  Consider Referral to Dermatology: To document and manage persistent skin irritation and rashes. Recommend follow up with Plastic Surgery, Dr. Leta Baptist, Further strengthen the case for the medical necessity of skin removal. Supportive Measures: Continue over-the-counter treatments, apply antifungal powders/creams, ensure dryness of affected areas.

## 2023-04-29 NOTE — Assessment & Plan Note (Signed)
Plan:  Continue monitoring GFR: Maintain regular lab checks to monitor kidney function. Dietary Recommendations: Emphasize a healthy, low-carb diet with plenty of fruits, vegetables, whole grains, and lean meats. Lifestyle Management: Encourage sustained healthy lifestyle practices to maintain kidney function. Patient Education: Discuss importance of continued adherence to dietary recommendations and regular follow-up for kidney function.

## 2023-04-30 NOTE — Telephone Encounter (Signed)
Pt advised via my chart message

## 2023-05-13 DIAGNOSIS — Z1211 Encounter for screening for malignant neoplasm of colon: Secondary | ICD-10-CM | POA: Diagnosis not present

## 2023-05-17 LAB — COLOGUARD: COLOGUARD: NEGATIVE

## 2023-06-10 DIAGNOSIS — Z853 Personal history of malignant neoplasm of breast: Secondary | ICD-10-CM | POA: Diagnosis not present

## 2023-06-10 DIAGNOSIS — E65 Localized adiposity: Secondary | ICD-10-CM | POA: Diagnosis not present

## 2023-06-10 DIAGNOSIS — L304 Erythema intertrigo: Secondary | ICD-10-CM | POA: Diagnosis not present

## 2023-06-10 DIAGNOSIS — L987 Excessive and redundant skin and subcutaneous tissue: Secondary | ICD-10-CM | POA: Diagnosis not present

## 2023-06-10 DIAGNOSIS — Z923 Personal history of irradiation: Secondary | ICD-10-CM | POA: Diagnosis not present

## 2023-06-22 ENCOUNTER — Other Ambulatory Visit: Payer: Self-pay | Admitting: Family Medicine

## 2023-06-22 ENCOUNTER — Ambulatory Visit: Payer: Medicaid Other

## 2023-06-22 ENCOUNTER — Ambulatory Visit
Admission: RE | Admit: 2023-06-22 | Discharge: 2023-06-22 | Disposition: A | Discharge status: Home / Self Care | Payer: Medicaid Other | Source: Ambulatory Visit | Attending: Family Medicine | Admitting: Family Medicine

## 2023-06-22 DIAGNOSIS — Z1231 Encounter for screening mammogram for malignant neoplasm of breast: Secondary | ICD-10-CM | POA: Diagnosis not present

## 2023-06-22 DIAGNOSIS — Z0000 Encounter for general adult medical examination without abnormal findings: Secondary | ICD-10-CM

## 2023-07-02 ENCOUNTER — Encounter: Payer: Self-pay | Admitting: Family Medicine

## 2023-07-02 DIAGNOSIS — T753XXA Motion sickness, initial encounter: Secondary | ICD-10-CM

## 2023-07-03 MED ORDER — TRANSDERM-SCOP 1 MG/3DAYS TD PT72: 1.0000 | MEDICATED_PATCH | TRANSDERMAL | 0 refills | Status: DC

## 2023-07-03 NOTE — Telephone Encounter (Signed)
    Chief Complaint:  Rhonda Moss is a 52 y.o. female who presents for a MyChart e-visit.  Assessment/Plan:  New/Acute Problems: Motion sickness  Subjective:  HPI:  Going on upcoming cruise.  Objective:   No results found for this or any previous visit (from the past 72 hour(s)).   MyChart e-Visit   Contact was initiated by the patient on 07/02/2023 via MyChart. The limitations of evaluation and management by telemedicine and the availability of in person appointments were discussed. The patient expressed understanding and agreed to proceed.   Persons participating in the virtual visit: Myself and patient  A total of 10 minutes were spent on medical discussion for the 7 day period starting on 07/03/2023. There is no separately reported E/M service related to this service in the 7 days preceding date of initial contact nor does the patient have an upcoming soonest available appointment for this issue.

## 2023-07-13 ENCOUNTER — Ambulatory Visit (INDEPENDENT_AMBULATORY_CARE_PROVIDER_SITE_OTHER): Payer: Medicaid Other | Admitting: Obstetrics & Gynecology

## 2023-07-13 ENCOUNTER — Encounter (HOSPITAL_BASED_OUTPATIENT_CLINIC_OR_DEPARTMENT_OTHER): Payer: Self-pay | Admitting: Obstetrics & Gynecology

## 2023-07-13 VITALS — BP 138/82 | HR 57 | Ht 67.0 in | Wt 221.8 lb

## 2023-07-13 DIAGNOSIS — Z9889 Other specified postprocedural states: Secondary | ICD-10-CM

## 2023-07-13 DIAGNOSIS — Z90710 Acquired absence of both cervix and uterus: Secondary | ICD-10-CM | POA: Diagnosis not present

## 2023-07-13 DIAGNOSIS — B009 Herpesviral infection, unspecified: Secondary | ICD-10-CM

## 2023-07-13 DIAGNOSIS — Z853 Personal history of malignant neoplasm of breast: Secondary | ICD-10-CM

## 2023-07-13 DIAGNOSIS — Z01419 Encounter for gynecological examination (general) (routine) without abnormal findings: Secondary | ICD-10-CM

## 2023-07-13 DIAGNOSIS — R42 Dizziness and giddiness: Secondary | ICD-10-CM

## 2023-07-13 NOTE — Progress Notes (Signed)
52 y.o. [id number] Married [place] American female here for annual exam.  Doing well.  Having surgery next month with Dr. Leta Baptist.  Had panniculectomy last September.  She is very pleased with the way it looks.  Denies vaginal bleeding.    Off Tamoxifen since 2020.    Having some dizziness.  Would like to check for anemia and iron as has hx.   Patient's last menstrual period was 08/31/2018 (approximate).          Sexually active: Yes.    The current method of family planning is status post hysterectomy.    Exercising: yes Smoker:  no  Health Maintenance: Pap:  2019, neg with neg HR HPV History of abnormal Pap:  LEEP  MMG:  06/24/2023.  Have discussed MRI.   Colonoscopy:  negative cologuard 05/13/2023 BMD:   not indicated yet Screening Labs: done 02/2023   reports that she has never smoked. She has never been exposed to tobacco smoke. She has never used smokeless tobacco. She reports current alcohol use of about 1.0 standard drink of alcohol per week. She reports that she does not use drugs.  Past Medical History:  Diagnosis Date   Allergy    Anxiety    Breast cancer (HCC) dx. 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   Panniculitis 07/25/2022   Personal history of radiation therapy     Past Surgical History:  Procedure Laterality Date   BREAST BIOPSY Left 03/2015   BREAST LUMPECTOMY Left 06/2015   BREAST LUMPECTOMY WITH RADIOACTIVE SEED AND SENTINEL LYMPH NODE BIOPSY Left 06/22/2015   Procedure: LEFT BREAST LUMPECTOMY WITH RADIOACTIVE SEED AND SENTINEL LYMPH NODE BIOPSY;  Surgeon: Abigail Miyamoto, MD;  Location: [facility];  Service: General;  Laterality: Left;   CERVICAL BIOPSY  W/ LOOP ELECTRODE EXCISION  1991   CESAREAN SECTION  2002   CHOLECYSTECTOMY  1998   CYSTOSCOPY N/A 11/29/2018   Procedure: CYSTOSCOPY;  Surgeon: Jerene Bears, MD;  Location: [facility];  Service: Gynecology;  Laterality: N/A;   LYMPHADENECTOMY  1992   single node in right neck   PANNICULECTOMY N/A 07/25/2022   Procedure: PANNICULECTOMY;  Surgeon: Glenna Fellows, MD;  Location: [facility];  Service: Plastics;  Laterality: N/A;   SALPINGECTOMY Left 10/2005   STRABISMUS SURGERY  2016   TOTAL LAPAROSCOPIC HYSTERECTOMY WITH SALPINGECTOMY Bilateral 11/29/2018   Procedure: TOTAL LAPAROSCOPIC HYSTERECTOMY WITH SALPINGECTOMY;  Surgeon: Jerene Bears, MD;  Location: [facility];  Service: Gynecology;  Laterality: Bilateral;  Possible BSO. Extended recovery bed needed    Current Outpatient Medications  Medication Sig Dispense Refill   ibuprofen (ADVIL) 800 MG tablet Take 1 tablet by mouth every 8 (eight) hours as needed.     polyethylene glycol powder (GLYCOLAX/MIRALAX) 17 GM/SCOOP powder Take 17 g by mouth daily. 500 g 0   promethazine-dextromethorphan (PROMETHAZINE-DM) 6.25-15 MG/5ML syrup Take 5 mLs by mouth 4 (four) times daily as needed for cough. 118 mL 0   scopolamine (TRANSDERM-SCOP) 1 MG/3DAYS Place 1 patch (1.5 mg total) onto the skin every 3 (  three) days. 10 patch 0   SENNA PO Take by mouth. Senna Leaf     valACYclovir (VALTREX) 500 MG tablet Take 1 tablet daily for suppressive therapy.  With symptoms, increase to bid x 3 days. 30 tablet 3   No current facility-administered medications for this visit.    Family History  Problem Relation Age of Onset   Breast cancer Mother 35   Breast cancer Sister 58   Breast cancer Maternal Aunt 60       +calcifications   Prostate cancer Maternal Uncle 67   Heart attack Maternal Grandmother    Heart attack Maternal  Grandfather    Prostate cancer Maternal Grandfather 16   Diabetes Paternal Grandmother    Pancreatitis Paternal Grandfather    Prostate cancer Cousin        dx. late 68s    ROS: Constitutional: negative Genitourinary:negative  Exam:   BP 138/82 (BP Location: Right Arm, Patient Position: Sitting, Cuff Size: Large)   Pulse (!) 57   Ht 5\' 7"  (1.702 m) Comment: Reported  Wt 221 lb 12.8 oz (100.6 kg)   LMP 08/31/2018 (Approximate)   BMI 34.74 kg/m   Height: 5\' 7"  (170.2 cm) (Reported)  General appearance: alert, cooperative and appears stated age Head: Normocephalic, without obvious abnormality, atraumatic Neck: no adenopathy, supple, symmetrical, trachea midline and thyroid normal to inspection and palpation Lungs: clear to auscultation bilaterally Breasts: normal appearance, no masses or tenderness Heart: regular rate and rhythm Abdomen: soft, non-tender; bowel sounds normal; no masses,  no organomegaly Extremities: extremities normal, atraumatic, no cyanosis or edema Skin: Skin color, texture, turgor normal. No rashes or lesions Lymph nodes: Cervical, supraclavicular, and axillary nodes normal. No abnormal inguinal nodes palpated Neurologic: Grossly normal   Pelvic: External genitalia:  no lesions              Urethra:  normal appearing urethra with no masses, tenderness or lesions              Bartholins and Skenes: normal                 Vagina: normal appearing vagina with normal color and no discharge, no lesions              Cervix: absent              Pap taken: No. Bimanual Exam:  Uterus:  uterus absent              Adnexa: normal adnexa               Pt declined rectal exam  Chaperone, Ina Homes, CMA, was present for exam.  Assessment/Plan: 1. Well woman exam with routine gynecological exam - Pap smear 2019 prior to hysterectomy.  Remote hx of LEEP. - Mammogram 06/24/2023 - Cologuard 04/2023 - Bone mineral density not indicated at this time - lab work done  with PCP, Dr. Janee Morn - vaccines reviewed/updated  2. 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 H/O: hysterectomy  5. S/P LEEP - 1991   6.  Dizziness - CBC and iron levels ordered today

## 2023-07-14 LAB — CBC WITH DIFFERENTIAL/PLATELET
Basophils Absolute: 0 10*3/uL (ref 0.0–0.2)
Basos: 0 %
EOS (ABSOLUTE): 0 10*3/uL (ref 0.0–0.4)
Eos: 1 %
Hematocrit: 38 % (ref 34.0–46.6)
Hemoglobin: 12.8 g/dL (ref 11.1–15.9)
Immature Grans (Abs): 0 10*3/uL (ref 0.0–0.1)
Immature Granulocytes: 0 %
Lymphocytes Absolute: 2.4 10*3/uL (ref 0.7–3.1)
Lymphs: 49 %
MCH: 31.9 pg (ref 26.6–33.0)
MCHC: 33.7 g/dL (ref 31.5–35.7)
MCV: 95 fL (ref 79–97)
Monocytes Absolute: 0.3 10*3/uL (ref 0.1–0.9)
Monocytes: 6 %
Neutrophils Absolute: 2.2 10*3/uL (ref 1.4–7.0)
Neutrophils: 44 %
Platelets: 259 10*3/uL (ref 150–450)
RBC: 4.01 x10E6/uL (ref 3.77–5.28)
RDW: 12.2 % (ref 11.7–15.4)
WBC: 4.8 10*3/uL (ref 3.4–10.8)

## 2023-07-14 LAB — IRON,TIBC AND FERRITIN PANEL
Ferritin: 424 ng/mL — ABNORMAL HIGH (ref 15–150)
Iron Saturation: 33 % (ref 15–55)
Iron: 81 ug/dL (ref 27–159)
Total Iron Binding Capacity: 248 ug/dL — ABNORMAL LOW (ref 250–450)
UIBC: 167 ug/dL (ref 131–425)

## 2023-07-15 ENCOUNTER — Encounter (HOSPITAL_BASED_OUTPATIENT_CLINIC_OR_DEPARTMENT_OTHER): Payer: Self-pay | Admitting: Obstetrics & Gynecology

## 2023-07-15 ENCOUNTER — Encounter: Payer: Self-pay | Admitting: Family Medicine

## 2023-07-16 ENCOUNTER — Other Ambulatory Visit (HOSPITAL_BASED_OUTPATIENT_CLINIC_OR_DEPARTMENT_OTHER): Payer: Self-pay | Admitting: Obstetrics & Gynecology

## 2023-07-16 DIAGNOSIS — F419 Anxiety disorder, unspecified: Secondary | ICD-10-CM

## 2023-07-16 MED ORDER — ALPRAZOLAM 0.5 MG PO TABS: ORAL_TABLET | ORAL | 0 refills | Status: DC

## 2023-07-22 NOTE — H&P (Signed)
  Patient ID: Rhonda Moss is a 52 y.o. female.     HPI   Returns for follow up discussion prior to planned upper body lift/lipectomy back. Highest wt 375 lb approximately 2014. Approximately 150 lb weight loss through diet exercise.  Post op panniculectomy.  Bothered by lateral chest wall and back soft tissue rolls. Notes this is greater in size over right. Reports irritation beneath skin folds.    Wt has fluctuated within 10 lbs of current over last year per chart review.   PMH significant for anemia secondary to menorrhagia now s/p hysterectomy. Hb 12.8 06/2023.    History right breast ca post lumpectomy and radiation. Completed 5 years tamoxifen. Discharged from Oncology follow up. FH significant for breast ca in mother sister and MA. Genetics negative.    Works in [employer], quantity and appraising. Two adult children. Lives with spous   Review of Systems Remainder 12 point review negative    Objective Physical Exam  Cardiovascular: Normal rate, regular rhythm and normal heart sounds.    Pulmonary/Chest Effort normal and breath sounds normal.      MS: soft tissue rolls from lateral upper abdomen to near midline back, greater on right This is distinct from lateral chest wall soft tissue redundancy lateral to breasts   Assessment/Plan Excessive and redundant skin and subcutaneous tissue   Hold NSAIDs week prior to surgery. She is leaving for cruise this week and will return week prior to surgery.   Reviewed upper body lift with scars from lateral abdomen to midline back. Reviewed overnight stay, drains. Reviewed in mirror areas areas that would be addressed. This will not affect the lateral breast/chest soft tissue rolls; these will require separate incision and procedure. Reviewed scar maturation over months, changes with weight aging, risks wound healing problems, seroma. Counseled liposuction may be used to aid with dissection and closure, however liposuction alone would not  address concern and leave her with significant excess skin.   Additional risks including but not limited to bleeding, hematoma, seroma, need for additional procedures, damage to adjacent structures, asymmetry, wound healing problems, infection, unacceptable cosmetic result, blood clots in legs or lungs reviewed.   Rx for oxycodone given. Patient has been advised to avoid NSAIDs due to kidney function.  Glenna Fellows, MD [facility] Plastic & Reconstructive Surgery  Office/ physician access line after hours [phone]

## 2023-07-31 ENCOUNTER — Encounter (HOSPITAL_BASED_OUTPATIENT_CLINIC_OR_DEPARTMENT_OTHER): Payer: Self-pay | Admitting: Plastic Surgery

## 2023-07-31 ENCOUNTER — Other Ambulatory Visit: Payer: Self-pay

## 2023-08-03 MED ORDER — CHLORHEXIDINE GLUCONATE CLOTH 2 % EX PADS: 6.0000 | MEDICATED_PAD | Freq: Once | CUTANEOUS | Status: DC

## 2023-08-03 NOTE — Progress Notes (Signed)

## 2023-08-07 ENCOUNTER — Encounter (HOSPITAL_BASED_OUTPATIENT_CLINIC_OR_DEPARTMENT_OTHER): Payer: Self-pay | Admitting: Plastic Surgery

## 2023-08-07 ENCOUNTER — Other Ambulatory Visit: Payer: Self-pay

## 2023-08-07 ENCOUNTER — Encounter (HOSPITAL_BASED_OUTPATIENT_CLINIC_OR_DEPARTMENT_OTHER)
Admission: RE | Disposition: A | Discharge status: Home / Self Care | Payer: Self-pay | Source: Home / Self Care | Attending: Plastic Surgery

## 2023-08-07 ENCOUNTER — Ambulatory Visit (HOSPITAL_BASED_OUTPATIENT_CLINIC_OR_DEPARTMENT_OTHER)
Admission: RE | Admit: 2023-08-07 | Discharge: 2023-08-08 | Disposition: A | Discharge status: Home / Self Care | Payer: Medicaid Other | Attending: Plastic Surgery | Admitting: Plastic Surgery

## 2023-08-07 ENCOUNTER — Ambulatory Visit (HOSPITAL_BASED_OUTPATIENT_CLINIC_OR_DEPARTMENT_OTHER): Payer: Medicaid Other | Admitting: Certified Registered"

## 2023-08-07 DIAGNOSIS — L304 Erythema intertrigo: Secondary | ICD-10-CM

## 2023-08-07 DIAGNOSIS — D649 Anemia, unspecified: Secondary | ICD-10-CM | POA: Diagnosis not present

## 2023-08-07 DIAGNOSIS — L987 Excessive and redundant skin and subcutaneous tissue: Secondary | ICD-10-CM | POA: Insufficient documentation

## 2023-08-07 DIAGNOSIS — F419 Anxiety disorder, unspecified: Secondary | ICD-10-CM | POA: Insufficient documentation

## 2023-08-07 DIAGNOSIS — Z853 Personal history of malignant neoplasm of breast: Secondary | ICD-10-CM | POA: Diagnosis not present

## 2023-08-07 SURGERY — LIPECTOMY
Anesthesia: General | Site: Back | Laterality: Bilateral

## 2023-08-07 MED ORDER — ACETAMINOPHEN 500 MG PO TABS
1000.0000 mg | ORAL_TABLET | ORAL | Status: AC
  Administered 2023-08-07: 1000 mg via ORAL

## 2023-08-07 MED ORDER — FENTANYL CITRATE (PF) 100 MCG/2ML IJ SOLN
25.0000 ug | INTRAMUSCULAR | Status: DC | PRN
  Administered 2023-08-07 (×2): 50 ug via INTRAVENOUS

## 2023-08-07 MED ORDER — SODIUM BICARBONATE 4.2 % IV SOLN
INTRAVENOUS | Status: AC
  Filled 2023-08-07: qty 20

## 2023-08-07 MED ORDER — KCL IN DEXTROSE-NACL 20-5-0.45 MEQ/L-%-% IV SOLN
INTRAVENOUS | Status: DC
  Filled 2023-08-07: qty 1000

## 2023-08-07 MED ORDER — FENTANYL CITRATE (PF) 100 MCG/2ML IJ SOLN
INTRAMUSCULAR | Status: DC | PRN
  Administered 2023-08-07: 50 ug via INTRAVENOUS
  Administered 2023-08-07: 25 ug via INTRAVENOUS
  Administered 2023-08-07: 100 ug via INTRAVENOUS
  Administered 2023-08-07: 25 ug via INTRAVENOUS
  Administered 2023-08-07: 50 ug via INTRAVENOUS

## 2023-08-07 MED ORDER — SODIUM BICARBONATE 4.2 % IV SOLN
INTRAVENOUS | Status: AC
  Filled 2023-08-07: qty 10

## 2023-08-07 MED ORDER — ENOXAPARIN SODIUM 40 MG/0.4ML IJ SOSY
40.0000 mg | PREFILLED_SYRINGE | INTRAMUSCULAR | Status: DC
  Administered 2023-08-08: 40 mg via SUBCUTANEOUS
  Filled 2023-08-07: qty 0.4

## 2023-08-07 MED ORDER — FENTANYL CITRATE (PF) 100 MCG/2ML IJ SOLN
INTRAMUSCULAR | Status: AC
  Filled 2023-08-07: qty 2

## 2023-08-07 MED ORDER — CELECOXIB 200 MG PO CAPS
ORAL_CAPSULE | ORAL | Status: AC
  Filled 2023-08-07: qty 1

## 2023-08-07 MED ORDER — BUPIVACAINE HCL (PF) 0.5 % IJ SOLN
INTRAMUSCULAR | Status: AC
  Filled 2023-08-07: qty 30

## 2023-08-07 MED ORDER — ACETAMINOPHEN 160 MG/5ML PO SOLN: 325.0000 mg | ORAL | Status: DC | PRN

## 2023-08-07 MED ORDER — PROPOFOL 10 MG/ML IV BOLUS: INTRAVENOUS | Status: DC | PRN

## 2023-08-07 MED ORDER — GABAPENTIN 300 MG PO CAPS
ORAL_CAPSULE | ORAL | Status: AC
  Filled 2023-08-07: qty 1

## 2023-08-07 MED ORDER — 0.9 % SODIUM CHLORIDE (POUR BTL) OPTIME
TOPICAL | Status: DC | PRN
Start: 2023-08-07 — End: 2023-08-07
  Administered 2023-08-07: 500 mL

## 2023-08-07 MED ORDER — ONDANSETRON 4 MG PO TBDP: 4.0000 mg | ORAL_TABLET | Freq: Four times a day (QID) | ORAL | Status: DC | PRN

## 2023-08-07 MED ORDER — LACTATED RINGERS IV SOLN: INTRAVENOUS | Status: DC

## 2023-08-07 MED ORDER — ACETAMINOPHEN 500 MG PO TABS
ORAL_TABLET | ORAL | Status: AC
  Filled 2023-08-07: qty 2

## 2023-08-07 MED ORDER — OXYCODONE HCL 5 MG PO TABS
5.0000 mg | ORAL_TABLET | Freq: Once | ORAL | Status: AC | PRN
  Administered 2023-08-07: 5 mg via ORAL
  Filled 2023-08-07: qty 1

## 2023-08-07 MED ORDER — TRANEXAMIC ACID-NACL 1000-0.7 MG/100ML-% IV SOLN
INTRAVENOUS | Status: AC
  Filled 2023-08-07: qty 100

## 2023-08-07 MED ORDER — MIDAZOLAM HCL 2 MG/2ML IJ SOLN
INTRAMUSCULAR | Status: AC
  Filled 2023-08-07: qty 2

## 2023-08-07 MED ORDER — ALPRAZOLAM 0.25 MG PO TABS
0.2500 mg | ORAL_TABLET | Freq: Three times a day (TID) | ORAL | Status: DC | PRN
  Administered 2023-08-07: 0.25 mg via ORAL
  Filled 2023-08-07: qty 1

## 2023-08-07 MED ORDER — CELECOXIB 200 MG PO CAPS
200.0000 mg | ORAL_CAPSULE | ORAL | Status: AC
  Administered 2023-08-07: 200 mg via ORAL

## 2023-08-07 MED ORDER — KETOROLAC TROMETHAMINE 30 MG/ML IJ SOLN
30.0000 mg | Freq: Three times a day (TID) | INTRAMUSCULAR | Status: DC
  Administered 2023-08-07 – 2023-08-08 (×2): 30 mg via INTRAVENOUS
  Filled 2023-08-07 (×2): qty 1

## 2023-08-07 MED ORDER — EPINEPHRINE PF 1 MG/ML IJ SOLN
INTRAMUSCULAR | Status: AC
  Filled 2023-08-07: qty 1

## 2023-08-07 MED ORDER — PROMETHAZINE HCL 25 MG/ML IJ SOLN: 6.2500 mg | INTRAMUSCULAR | Status: DC | PRN

## 2023-08-07 MED ORDER — BUPIVACAINE HCL (PF) 0.5 % IJ SOLN
INTRAMUSCULAR | Status: DC | PRN
  Administered 2023-08-07: 30 mL

## 2023-08-07 MED ORDER — HYDROMORPHONE HCL 1 MG/ML IJ SOLN: 0.5000 mg | INTRAMUSCULAR | Status: DC | PRN

## 2023-08-07 MED ORDER — PHENYLEPHRINE HCL-NACL 20-0.9 MG/250ML-% IV SOLN
INTRAVENOUS | Status: DC | PRN
Start: 2023-08-07 — End: 2023-08-07
  Administered 2023-08-07: 40 ug/min via INTRAVENOUS

## 2023-08-07 MED ORDER — GLYCOPYRROLATE 0.2 MG/ML IJ SOLN
INTRAMUSCULAR | Status: DC | PRN
Start: 2023-08-07 — End: 2023-08-07
  Administered 2023-08-07: .1 mg via INTRAVENOUS

## 2023-08-07 MED ORDER — GABAPENTIN 300 MG PO CAPS
300.0000 mg | ORAL_CAPSULE | ORAL | Status: AC
  Administered 2023-08-07: 300 mg via ORAL

## 2023-08-07 MED ORDER — GLYCOPYRROLATE PF 0.2 MG/ML IJ SOSY
PREFILLED_SYRINGE | INTRAMUSCULAR | Status: AC
  Filled 2023-08-07: qty 1

## 2023-08-07 MED ORDER — LIDOCAINE HCL (PF) 1 % IJ SOLN
INTRAMUSCULAR | Status: AC
  Filled 2023-08-07: qty 60

## 2023-08-07 MED ORDER — DEXAMETHASONE SODIUM PHOSPHATE 4 MG/ML IJ SOLN
INTRAMUSCULAR | Status: DC | PRN
  Administered 2023-08-07: 5 mg via INTRAVENOUS

## 2023-08-07 MED ORDER — OXYCODONE HCL 5 MG/5ML PO SOLN: 5.0000 mg | Freq: Once | ORAL | Status: AC | PRN

## 2023-08-07 MED ORDER — ACETAMINOPHEN 10 MG/ML IV SOLN: 1000.0000 mg | Freq: Once | INTRAVENOUS | Status: DC | PRN

## 2023-08-07 MED ORDER — PROPOFOL 10 MG/ML IV BOLUS
INTRAVENOUS | Status: DC | PRN
  Administered 2023-08-07: 160 mg via INTRAVENOUS

## 2023-08-07 MED ORDER — TRANEXAMIC ACID-NACL 1000-0.7 MG/100ML-% IV SOLN
1000.0000 mg | INTRAVENOUS | Status: AC
  Administered 2023-08-07: 1000 mg via INTRAVENOUS

## 2023-08-07 MED ORDER — SUGAMMADEX SODIUM 200 MG/2ML IV SOLN
INTRAVENOUS | Status: DC | PRN
Start: 2023-08-07 — End: 2023-08-07
  Administered 2023-08-07: 200 mg via INTRAVENOUS

## 2023-08-07 MED ORDER — SCOPOLAMINE 1 MG/3DAYS TD PT72: 1.0000 | MEDICATED_PATCH | TRANSDERMAL | Status: DC

## 2023-08-07 MED ORDER — ROCURONIUM BROMIDE 100 MG/10ML IV SOLN
INTRAVENOUS | Status: DC | PRN
  Administered 2023-08-07 (×2): 20 mg via INTRAVENOUS
  Administered 2023-08-07: 70 mg via INTRAVENOUS

## 2023-08-07 MED ORDER — OXYCODONE HCL 5 MG PO TABS
5.0000 mg | ORAL_TABLET | ORAL | Status: DC | PRN
  Administered 2023-08-07: 5 mg via ORAL
  Administered 2023-08-07: 10 mg via ORAL
  Filled 2023-08-07: qty 1
  Filled 2023-08-07: qty 2

## 2023-08-07 MED ORDER — DROPERIDOL 2.5 MG/ML IJ SOLN: 0.6250 mg | Freq: Once | INTRAMUSCULAR | Status: DC | PRN

## 2023-08-07 MED ORDER — CEFAZOLIN SODIUM-DEXTROSE 2-4 GM/100ML-% IV SOLN
INTRAVENOUS | Status: AC
  Filled 2023-08-07: qty 100

## 2023-08-07 MED ORDER — LIDOCAINE HCL (PF) 1 % IJ SOLN
INTRAMUSCULAR | Status: AC
  Filled 2023-08-07: qty 30

## 2023-08-07 MED ORDER — MIDAZOLAM HCL 5 MG/5ML IJ SOLN
INTRAMUSCULAR | Status: DC | PRN
  Administered 2023-08-07: 2 mg via INTRAVENOUS

## 2023-08-07 MED ORDER — CEFAZOLIN SODIUM-DEXTROSE 2-4 GM/100ML-% IV SOLN
2.0000 g | INTRAVENOUS | Status: AC
  Administered 2023-08-07: 2 g via INTRAVENOUS

## 2023-08-07 MED ORDER — ACETAMINOPHEN 325 MG PO TABS: 325.0000 mg | ORAL_TABLET | ORAL | Status: DC | PRN

## 2023-08-07 MED ORDER — ONDANSETRON HCL 4 MG/2ML IJ SOLN: 4.0000 mg | Freq: Four times a day (QID) | INTRAMUSCULAR | Status: DC | PRN

## 2023-08-07 MED ORDER — DIPHENHYDRAMINE HCL 50 MG/ML IJ SOLN
INTRAMUSCULAR | Status: AC
  Filled 2023-08-07: qty 1

## 2023-08-07 MED ORDER — SODIUM BICARBONATE 4.2 % IV SOLN
INTRAVENOUS | Status: DC | PRN
  Administered 2023-08-07: 425 mL

## 2023-08-07 SURGICAL SUPPLY — 73 items
ADH SKN CLS APL DERMABOND .7 (GAUZE/BANDAGES/DRESSINGS) ×4
APL PRP STRL LF DISP 70% ISPRP (MISCELLANEOUS) ×1
APPLIER CLIP 9.375 MED OPEN (MISCELLANEOUS) ×1
APR CLP MED 9.3 20 MLT OPN (MISCELLANEOUS) ×1
BINDER ABDOMINAL 10 UNV 27-48 (MISCELLANEOUS) IMPLANT
BINDER ABDOMINAL 12 ML 46-62 (SOFTGOODS) IMPLANT
BINDER ABDOMINAL 12 SM 30-45 (SOFTGOODS) IMPLANT
BINDER ABDOMINAL 12 XL 75-84 (SOFTGOODS) IMPLANT
BINDER BREAST 3XL (GAUZE/BANDAGES/DRESSINGS) IMPLANT
BLADE CLIPPER SURG (BLADE) IMPLANT
BLADE SURG 10 STRL SS (BLADE) ×2 IMPLANT
BLADE SURG 11 STRL SS (BLADE) ×1 IMPLANT
BLADE SURG 15 STRL LF DISP TIS (BLADE) IMPLANT
BLADE SURG 15 STRL SS (BLADE) ×1
CANISTER SUCT 1200ML W/VALVE (MISCELLANEOUS) ×1 IMPLANT
CHLORAPREP W/TINT 26 (MISCELLANEOUS) ×1 IMPLANT
CLIP APPLIE 9.375 MED OPEN (MISCELLANEOUS) ×1 IMPLANT
COVER BACK TABLE 60X90IN (DRAPES) ×1 IMPLANT
COVER MAYO STAND STRL (DRAPES) ×1 IMPLANT
DERMABOND ADVANCED .7 DNX12 (GAUZE/BANDAGES/DRESSINGS) ×2 IMPLANT
DRAIN CHANNEL 15F RND FF W/TCR (WOUND CARE) ×2 IMPLANT
DRAIN CHANNEL 19F RND (DRAIN) IMPLANT
DRAPE TOP ARMCOVERS (MISCELLANEOUS) ×1 IMPLANT
DRAPE U-SHAPE 76X120 STRL (DRAPES) ×1 IMPLANT
DRAPE UTILITY XL STRL (DRAPES) ×1 IMPLANT
ELECT BLADE 4.0 EZ CLEAN MEGAD (MISCELLANEOUS)
ELECT COATED BLADE 2.86 ST (ELECTRODE) IMPLANT
ELECT REM PT RETURN 9FT ADLT (ELECTROSURGICAL) ×1
ELECTRODE BLDE 4.0 EZ CLN MEGD (MISCELLANEOUS) IMPLANT
ELECTRODE REM PT RTRN 9FT ADLT (ELECTROSURGICAL) ×1 IMPLANT
EVACUATOR SILICONE 100CC (DRAIN) ×2 IMPLANT
GAUZE PAD ABD 8X10 STRL (GAUZE/BANDAGES/DRESSINGS) ×2 IMPLANT
GAUZE XEROFORM 1X8 LF (GAUZE/BANDAGES/DRESSINGS) IMPLANT
GLOVE BIO SURGEON STRL SZ 6 (GLOVE) ×3 IMPLANT
GLOVE BIO SURGEON STRL SZ 6.5 (GLOVE) IMPLANT
GLOVE BIOGEL PI IND STRL 7.0 (GLOVE) IMPLANT
GLOVE BIOGEL PI IND STRL 8 (GLOVE) IMPLANT
GLOVE SURG SYN 8.0 (GLOVE) ×2
GLOVE SURG SYN 8.0 PF PI (GLOVE) IMPLANT
GOWN STRL REUS W/ TWL LRG LVL3 (GOWN DISPOSABLE) ×2 IMPLANT
GOWN STRL REUS W/ TWL XL LVL3 (GOWN DISPOSABLE) IMPLANT
GOWN STRL REUS W/TWL LRG LVL3 (GOWN DISPOSABLE) ×2
GOWN STRL REUS W/TWL XL LVL3 (GOWN DISPOSABLE) ×2
NDL FILTER BLUNT 18X1 1/2 (NEEDLE) ×1 IMPLANT
NDL HYPO 25X1 1.5 SAFETY (NEEDLE) IMPLANT
NDL SAFETY ECLIP 18X1.5 (MISCELLANEOUS) IMPLANT
NEEDLE FILTER BLUNT 18X1 1/2 (NEEDLE) ×1
NEEDLE HYPO 25X1 1.5 SAFETY (NEEDLE)
NS IRRIG 1000ML POUR BTL (IV SOLUTION) ×1 IMPLANT
PACK BASIN DAY SURGERY FS (CUSTOM PROCEDURE TRAY) ×1 IMPLANT
PENCIL SMOKE EVACUATOR (MISCELLANEOUS) ×1 IMPLANT
PIN SAFETY STERILE (MISCELLANEOUS) ×1 IMPLANT
SHEET MEDIUM DRAPE 40X70 STRL (DRAPES) ×2 IMPLANT
SLEEVE SCD COMPRESS KNEE MED (STOCKING) ×1 IMPLANT
SPONGE T-LAP 18X18 ~~LOC~~+RFID (SPONGE) ×2 IMPLANT
STAPLER VISISTAT 35W (STAPLE) ×1 IMPLANT
SUT ETHILON 2 0 FS 18 (SUTURE) ×2 IMPLANT
SUT MNCRL AB 3-0 PS2 18 (SUTURE) IMPLANT
SUT MNCRL AB 4-0 PS2 18 (SUTURE) ×2 IMPLANT
SUT PDS AB 0 CT 36 (SUTURE) ×1 IMPLANT
SUT PDS AB 2-0 CT2 27 (SUTURE) IMPLANT
SUT PLAIN 5 0 P 3 18 (SUTURE) IMPLANT
SUT VLOC 180 0 24IN GS25 (SUTURE) ×1 IMPLANT
SYR 50ML LL SCALE MARK (SYRINGE) ×1 IMPLANT
SYR BULB IRRIG 60ML STRL (SYRINGE) ×1 IMPLANT
SYR CONTROL 10ML LL (SYRINGE) IMPLANT
TOWEL GREEN STERILE FF (TOWEL DISPOSABLE) ×1 IMPLANT
TRAY FOLEY W/BAG SLVR 14FR LF (SET/KITS/TRAYS/PACK) IMPLANT
TUBE CONNECTING 20X1/4 (TUBING) ×1 IMPLANT
TUBING INFILTRATION IT-10001 (TUBING) ×1 IMPLANT
TUBING SET GRADUATE ASPIR 12FT (MISCELLANEOUS) ×1 IMPLANT
UNDERPAD 30X36 HEAVY ABSORB (UNDERPADS AND DIAPERS) ×2 IMPLANT
YANKAUER SUCT BULB TIP NO VENT (SUCTIONS) ×1 IMPLANT

## 2023-08-07 NOTE — Anesthesia Procedure Notes (Signed)
Procedure Name: Intubation Date/Time: 08/07/2023 10:34 AM  Performed by: Connor Meacham, Jewel Baize, CRNAPre-anesthesia Checklist: Patient identified, Emergency Drugs available, Suction available and Patient being monitored Patient Re-evaluated:Patient Re-evaluated prior to induction Oxygen Delivery Method: Circle system utilized Preoxygenation: Pre-oxygenation with 100% oxygen Induction Type: IV induction Ventilation: Mask ventilation without difficulty Laryngoscope Size: Mac and 3 Grade View: Grade II Tube type: Oral Tube size: 7.0 mm Number of attempts: 1 Airway Equipment and Method: Stylet and Oral airway Placement Confirmation: ETT inserted through vocal cords under direct vision, positive ETCO2 and breath sounds checked- equal and bilateral Secured at: 21 cm Tube secured with: Tape Dental Injury: Teeth and Oropharynx as per pre-operative assessment

## 2023-08-07 NOTE — Anesthesia Preprocedure Evaluation (Signed)
Anesthesia Evaluation  Patient identified by MRN, date of birth, ID band Patient awake    Reviewed: Allergy & Precautions, NPO status , Patient's Chart, lab work & pertinent test results  Airway Mallampati: I  TM Distance: >3 FB Neck ROM: Full    Dental  (+) Teeth Intact, Dental Advisory Given   Pulmonary neg pulmonary ROS   breath sounds clear to auscultation       Cardiovascular negative cardio ROS  Rhythm:Regular Rate:Normal     Neuro/Psych  PSYCHIATRIC DISORDERS Anxiety     negative neurological ROS     GI/Hepatic negative GI ROS, Neg liver ROS,,,  Endo/Other  negative endocrine ROS    Renal/GU Renal disease     Musculoskeletal negative musculoskeletal ROS (+)    Abdominal   Peds  Hematology  (+) Blood dyscrasia, anemia   Anesthesia Other Findings   Reproductive/Obstetrics                             Anesthesia Physical Anesthesia Plan  ASA: 2  Anesthesia Plan: General   Post-op Pain Management: Tylenol PO (pre-op)*, Celebrex PO (pre-op)* and Gabapentin PO (pre-op)*   Induction: Intravenous  PONV Risk Score and Plan: 4 or greater and Ondansetron, Dexamethasone, Midazolam and Scopolamine patch - Pre-op  Airway Management Planned: Oral ETT  Additional Equipment: None  Intra-op Plan:   Post-operative Plan: Extubation in OR  Informed Consent: I have reviewed the patients History and Physical, chart, labs and discussed the procedure including the risks, benefits and alternatives for the proposed anesthesia with the patient or authorized representative who has indicated his/her understanding and acceptance.     Dental advisory given  Plan Discussed with: CRNA  Anesthesia Plan Comments:        Anesthesia Quick Evaluation

## 2023-08-07 NOTE — Transfer of Care (Deleted)
Immediate Anesthesia Transfer of Care Note  Patient: LISAMARIE DEBOIS  Procedure(s) Performed: LIPECTOMY (Bilateral: Back)  Patient Location: PACU  Anesthesia Type:General  Level of Consciousness: awake, alert , oriented, and patient cooperative  Airway & Oxygen Therapy: Patient Spontanous Breathing and Patient connected to face mask oxygen  Post-op Assessment: Report given to RN and Post -op Vital signs reviewed and stable  Post vital signs: Reviewed and stable  Last Vitals:  Vitals Value Taken Time  BP    Temp    Pulse    Resp    SpO2      Last Pain: There were no vitals filed for this visit.       Complications: No notable events documented.

## 2023-08-07 NOTE — Op Note (Signed)
Operative Note   DATE OF OPERATION: 9.20.2024  LOCATION: [facility]-observation  SURGICAL DIVISION: Plastic Surgery  PREOPERATIVE DIAGNOSES:  1. Intertrigo back  POSTOPERATIVE DIAGNOSES:  same  PROCEDURE:  1. Lipectomy right and left upper back  SURGEON: Glenna Fellows MD MBA  ASSISTANT: none  ANESTHESIA:  General.   EBL: 35 ml  COMPLICATIONS: None immediate.   INDICATIONS FOR PROCEDURE:  The patient, Rhonda Moss, is a 52 y.o. female born on [birth year], is here for treatment recurrent intertrigo beneath soft tissue folds that has failed conservative measures.   FINDINGS: Lipoaspirate 525 ml Right back soft tissue 600 g Left back soft tissue 507 g  DESCRIPTION OF PROCEDURE:  The patient's operative site was marked with the patient in the preoperative area. The patient was taken to the operating room. SCDs were placed and IV antibiotics were given. Tranexamic acid given. The patient was placed in prone position. The patient's operative site was prepped and draped in a sterile fashion. A time out was performed and all information was confirmed to be correct.  Stab incision made in area of planned resection bilateral back and tumescent infiltrated, approximately 450 ml tumescent infiltrated in right and left back in area of planned resection and medial knee soft tissue. Suction assisted liposuction performed in marked areas to aid with dissection and closure. Total lipoaspirate approximately 525 ml.    Incision made over right back and carried through superficial fascia. Dissection proceeded from cephalad to caudal taking care to remain just beneath superficial fascia. The area marked for resection was marked again by palpation through elevated skin flaps and resection completed. Wound irrigated and hemostasis ensured. 19 Fr JP placed percutaneously and secured with 2-0 nylon. Closure completed with interrrupted 0 PDS suture in superficial fascia. 0 V lock suture used to  approximate dermis and 3-0 monocryl subcuticular suture used for skin closure.   I then directed attention to left back. Incision carried through superficial fascia. Dissection proceeded from cephalad to caudal taking care to remain just beneath superficial fascia. The area marked for resection was checked again by palpation through elevated skin flaps and resection completed. Wound irrigated and hemostasis ensured. 19 Fr JP placed percutaneously and secured with 2-0 nylon. Closure completed with interrrupted 0 PDS suture in superficial fascia. 0 V lock suture used to approximate dermis and 3-0 monocryl subcuticular suture used for skin closure. Dermabond applied to all incisions. Dry dressing applied.   The patient was returned to supine position. Breast binder applied. The patient was allowed to wake from anesthesia, extubated and taken to the recovery room in satisfactory condition.   SPECIMENS: none  DRAINS: 19 Fr JP right right and left subcutaneous back  Glenna Fellows, MD [facility] Plastic & Reconstructive Surgery  Office/ physician access line after hours [phone]

## 2023-08-07 NOTE — Interval H&P Note (Signed)
History and Physical Interval Note:  08/07/2023 9:45 AM  Rhonda Moss  has presented today for surgery, with the diagnosis of Excess and redundant skin back.  The various methods of treatment have been discussed with the patient and family. After consideration of risks, benefits and other options for treatment, the patient has consented to  Procedure(s): LIPECTOMY (Bilateral) upper back as a surgical intervention.  The patient's history has been reviewed, patient examined, no change in status, stable for surgery.  I have reviewed the patient's chart and labs.  Questions were answered to the patient's satisfaction.     Irean Hong Rhonda Moss

## 2023-08-07 NOTE — Transfer of Care (Signed)
Immediate Anesthesia Transfer of Care Note  Patient: Rhonda Moss  Procedure(s) Performed: LIPECTOMY (Bilateral: Back)  Patient Location: PACU  Anesthesia Type:General  Level of Consciousness: awake, drowsy, and patient cooperative  Airway & Oxygen Therapy: Patient Spontanous Breathing and Patient connected to face mask oxygen  Post-op Assessment: Report given to RN and Post -op Vital signs reviewed and stable  Post vital signs: Reviewed and stable  Last Vitals:  Vitals Value Taken Time  BP    Temp    Pulse    Resp    SpO2      Last Pain:  Vitals:   08/07/23 1003  TempSrc: Oral  PainSc: 0-No pain      Patients Stated Pain Goal: 3 (08/07/23 1003)  Complications: No notable events documented.

## 2023-08-07 NOTE — Anesthesia Postprocedure Evaluation (Signed)
Anesthesia Post Note  Patient: Rhonda Moss  Procedure(s) Performed: LIPECTOMY (Bilateral: Back)     Patient location during evaluation: PACU Anesthesia Type: General Level of consciousness: awake and alert Pain management: pain level controlled Vital Signs Assessment: post-procedure vital signs reviewed and stable Respiratory status: spontaneous breathing, nonlabored ventilation, respiratory function stable and patient connected to nasal cannula oxygen Cardiovascular status: blood pressure returned to baseline and stable Postop Assessment: no apparent nausea or vomiting Anesthetic complications: no  No notable events documented.  Last Vitals:  Vitals:   08/07/23 1545 08/07/23 1600  BP: 137/83 (!) 154/87  Pulse: (!) 58 (!) 57  Resp: 14 18  Temp:  (!) 36.4 C  SpO2: 100% 100%    Last Pain:  Vitals:   08/07/23 1600  TempSrc:   PainSc: 5                  Shelton Silvas

## 2023-08-08 DIAGNOSIS — L304 Erythema intertrigo: Secondary | ICD-10-CM | POA: Diagnosis not present

## 2023-08-08 NOTE — Discharge Summary (Signed)
Physician Discharge Summary  Patient ID: Rhonda Moss MRN: [id number] DOB/AGE: 1971-03-02 52 y.o.  Admit date: 08/07/2023 Discharge date: 08/08/2023  Admission Diagnoses: Intertrigo  Discharge Diagnoses:  Principal Problem:   Intertrigo   Discharged Condition: stable  Hospital Course: Post operatively patient was ambulatory with minimal assist, pain controlled. Instructed on bathing activity and drain care.  Treatments: surgery: lipectomy bilateral back 9.20.24  Discharge Exam: Blood pressure 111/69, pulse 60, temperature 98.6 F (37 C), resp. rate 16, height 5\' 7"  (1.702 m), weight 99.1 kg, last menstrual period 08/31/2018, SpO2 97%. Incision/Wound: Incision intact dry drains serosanguinous  Disposition: Discharge disposition: 01-Home or Self Care       Discharge Instructions     Call MD for:  redness, tenderness, or signs of infection (pain, swelling, bleeding, redness, odor or green/yellow discharge around incision site)   Complete by: As directed    Call MD for:  temperature >100.5   Complete by: As directed    Discharge instructions   Complete by: As directed    Ok to remove dressings and shower am 9.22.24. Soap and water ok, pat incisions dry. No creams or ointments over incisions. Do not let drains dangle in shower, attach to lanyard or similar.Strip and record drains twice daily and bring log to clinic visit.  Breast binder or compression garment all other times.  Ok to raise arms above shoulders for bathing and dressing.  No house yard work or exercise until cleared by MD.   Patient received all Rx prior to surgery. Recommend ibuprofen with meals to aid with pain control. Also ok to use Tylenol for pain. Recommend Miralax or Dulcolax as needed for constipation.   Driving Restrictions   Complete by: As directed    No driving for through follow up visit and then no driving if taking prescription pain medication.   Lifting restrictions   Complete by: As  directed    No lifting > 5-10 lbs until cleared by MD   Resume previous diet   Complete by: As directed       Allergies as of 08/08/2023       Reactions   Sulfa Antibiotics Hives        Medication List     TAKE these medications    ALPRAZolam 0.5 MG tablet Commonly known as: XANAX Take 1/2 to 1 tablet every 8 hours as needed for anxiety.  Using sparingly.  Do not use with alcohol.   ibuprofen 800 MG tablet Commonly known as: ADVIL Take 1 tablet by mouth every 8 (eight) hours as needed.   polyethylene glycol powder 17 GM/SCOOP powder Commonly known as: GLYCOLAX/MIRALAX Take 17 g by mouth daily.   promethazine-dextromethorphan 6.25-15 MG/5ML syrup Commonly known as: PROMETHAZINE-DM Take 5 mLs by mouth 4 (four) times daily as needed for cough.   SENNA PO Take by mouth. Senna Leaf   Transderm-Scop 1 MG/3DAYS Generic drug: scopolamine Place 1 patch (1.5 mg total) onto the skin every 3 (three) days.   valACYclovir 500 MG tablet Commonly known as: Valtrex Take 1 tablet daily for suppressive therapy.  With symptoms, increase to bid x 3 days.        Follow-up Information     Glenna Fellows, MD Follow up in 1 week(s).   Specialty: Plastic Surgery Why: as scheduled Contact information: [address] [place] [address] [place] [address] [phone]                 Signed: Glenna Fellows 08/08/2023, 7:47  AM

## 2023-08-13 DIAGNOSIS — L987 Excessive and redundant skin and subcutaneous tissue: Secondary | ICD-10-CM | POA: Diagnosis not present

## 2023-08-27 DIAGNOSIS — L987 Excessive and redundant skin and subcutaneous tissue: Secondary | ICD-10-CM | POA: Diagnosis not present

## 2023-10-09 DIAGNOSIS — F411 Generalized anxiety disorder: Secondary | ICD-10-CM | POA: Diagnosis not present

## 2023-10-20 DIAGNOSIS — F411 Generalized anxiety disorder: Secondary | ICD-10-CM | POA: Diagnosis not present

## 2023-10-28 ENCOUNTER — Other Ambulatory Visit (HOSPITAL_BASED_OUTPATIENT_CLINIC_OR_DEPARTMENT_OTHER): Payer: Medicaid Other

## 2023-10-28 DIAGNOSIS — R7989 Other specified abnormal findings of blood chemistry: Secondary | ICD-10-CM | POA: Diagnosis not present

## 2023-10-29 LAB — IRON,TIBC AND FERRITIN PANEL
Ferritin: 399 ng/mL — ABNORMAL HIGH (ref 15–150)
Iron Saturation: 40 % (ref 15–55)
Iron: 110 ug/dL (ref 27–159)
Total Iron Binding Capacity: 273 ug/dL (ref 250–450)
UIBC: 163 ug/dL (ref 131–425)

## 2023-11-02 NOTE — Addendum Note (Signed)
Addended by: Jerene Bears on: 11/02/2023 05:56 PM   Modules accepted: Orders

## 2023-11-05 DIAGNOSIS — F411 Generalized anxiety disorder: Secondary | ICD-10-CM | POA: Diagnosis not present

## 2023-11-19 ENCOUNTER — Other Ambulatory Visit (HOSPITAL_BASED_OUTPATIENT_CLINIC_OR_DEPARTMENT_OTHER): Payer: Self-pay | Admitting: Obstetrics & Gynecology

## 2023-11-19 DIAGNOSIS — R7989 Other specified abnormal findings of blood chemistry: Secondary | ICD-10-CM

## 2023-11-24 DIAGNOSIS — F411 Generalized anxiety disorder: Secondary | ICD-10-CM | POA: Diagnosis not present

## 2023-11-25 ENCOUNTER — Telehealth (HOSPITAL_BASED_OUTPATIENT_CLINIC_OR_DEPARTMENT_OTHER): Payer: Self-pay | Admitting: *Deleted

## 2023-11-25 NOTE — Telephone Encounter (Signed)
 LMOVM for pt to call regarding lab testing

## 2023-11-25 NOTE — Telephone Encounter (Signed)
-----   Message from Ronal GORMAN Pinal sent at 11/19/2023 12:39 PM EST ----- Regarding: ferritin Luke, Not sure if I ever sent this message.  Lauraine Pepper in hematology got back with me about her.  She needs a repeat ferritin in 3 months.  If goes up, should see hematology.  If stable or lower, ok for me to keep watching.  Can you call her and make sure lab scheduled?  I've placed the order.  Thanks.  Elvie

## 2023-12-02 ENCOUNTER — Other Ambulatory Visit: Payer: Self-pay | Admitting: Family

## 2023-12-02 DIAGNOSIS — R7989 Other specified abnormal findings of blood chemistry: Secondary | ICD-10-CM

## 2023-12-02 NOTE — Telephone Encounter (Signed)
 Advised pt that Dr. Annabell Key spoke with Kennard Pea with hematology who recommends a follow up ferritin in 3 months. Pt reports that she has appt with hematology tomorrow and will let us  know if we need to schedule the lab or if they will do it.

## 2023-12-03 ENCOUNTER — Ambulatory Visit (INDEPENDENT_AMBULATORY_CARE_PROVIDER_SITE_OTHER): Payer: Medicaid Other | Admitting: Family Medicine

## 2023-12-03 ENCOUNTER — Ambulatory Visit (INDEPENDENT_AMBULATORY_CARE_PROVIDER_SITE_OTHER)
Admission: RE | Admit: 2023-12-03 | Discharge: 2023-12-03 | Disposition: A | Discharge status: Home / Self Care | Payer: Medicaid Other | Source: Ambulatory Visit | Attending: Family Medicine | Admitting: Family Medicine

## 2023-12-03 ENCOUNTER — Encounter: Payer: Self-pay | Admitting: Family Medicine

## 2023-12-03 VITALS — BP 134/78 | HR 78 | Temp 97.1°F

## 2023-12-03 DIAGNOSIS — N1831 Chronic kidney disease, stage 3a: Secondary | ICD-10-CM | POA: Diagnosis not present

## 2023-12-03 DIAGNOSIS — G8929 Other chronic pain: Secondary | ICD-10-CM

## 2023-12-03 DIAGNOSIS — M25562 Pain in left knee: Secondary | ICD-10-CM

## 2023-12-03 MED ORDER — IBUPROFEN 400 MG PO TABS: 400.0000 mg | ORAL_TABLET | Freq: Four times a day (QID) | ORAL | 0 refills | Status: AC | PRN

## 2023-12-03 MED ORDER — KETOROLAC TROMETHAMINE 30 MG/ML IJ SOLN
30.0000 mg | Freq: Once | INTRAMUSCULAR | Status: AC
  Administered 2023-12-03: 30 mg via INTRAMUSCULAR

## 2023-12-03 MED ORDER — KETOROLAC TROMETHAMINE 30 MG/ML IJ SOLN
30.0000 mg | Freq: Once | INTRAMUSCULAR | Status: DC
Start: 2023-12-03 — End: 2023-12-03

## 2023-12-03 NOTE — Progress Notes (Signed)
Assessment/Plan:   Problem List Items Addressed This Visit       Genitourinary   Stage 3a chronic kidney disease (HCC)     Other   Chronic pain of left knee - Primary   Patient presents with left knee pain for the past two months, exacerbated by lateral movements and full extension. Pain is localized to the anterior knee and worsens after prolonged sitting and increased activity. Positive Thessaly test with pain during twisting motions suggests possible meniscal injury.  Differential Diagnosis:  Meniscal Tear: Positive Thessaly test, pain with twisting movements. Patellofemoral Pain Syndrome: Pain with full extension, anterior knee pain. Osteoarthritis: Less likely due to patient's age and lack of crepitus. IT Band Syndrome: Possible due to lateral knee pain with movement LCL strain: Possible due to lateral knee pain with movement  Plan:  Order X-ray of the left knee to evaluate for structural abnormalities. Prescribe ibuprofen 400?mg every 4-6 hours as needed for pain, with caution due to renal function. Recommend over-the-counter Voltaren gel (diclofenac) for topical pain relief. Administer Toradol 30?mg IM injection today for acute pain relief. Advise patient to limit activities that exacerbate pain; continue low-impact exercises. Refer to orthopedics for further evaluation; consider MRI if symptoms persist.      Relevant Medications   ibuprofen (ADVIL) 400 MG tablet   Other Relevant Orders   DG Knee Complete 4 Views Left   Ambulatory referral to Orthopedic Surgery    Medications Discontinued During This Encounter  Medication Reason   promethazine-dextromethorphan (PROMETHAZINE-DM) 6.25-15 MG/5ML syrup No longer needed (for PRN medications)   scopolamine (TRANSDERM-SCOP) 1 MG/3DAYS No longer needed (for PRN medications)   ibuprofen (ADVIL) 800 MG tablet    ketorolac (TORADOL) 30 MG/ML injection 30 mg     Return in about 2 weeks (around 12/17/2023), or if symptoms  worsen or fail to improve.    Subjective:   Encounter date: 12/03/2023  Rhonda Moss is a 53 y.o. female who has IDA (iron deficiency anemia); Hyperlipidemia; Obesity; HSV-2 infection; History of breast cancer; Skin irritation due to excess skin; Chronic idiopathic constipation; Stage 3a chronic kidney disease (HCC); Intertrigo; and Chronic pain of left knee on their problem list..   She  has a past medical history of Allergy, Anxiety, Breast cancer (HCC) (dx. 44 04/11/15), Ectopic pregnancy, Family history of breast cancer, Family history of breast cancer, Family history of prostate cancer, H/O: hysterectomy (07/11/2022), History of loop electrical excision procedure (LEEP) (07/11/2022), Low libido (03/13/2022), Malignant neoplasm of upper-outer quadrant of left breast in female, estrogen receptor positive (HCC) (04/29/2015), Panniculitis (07/25/2022), and Personal history of radiation therapy..   Chief Complaint: Left knee pain  History of Present Illness: Patient reports left knee pain for the past two months, worsening with lateral movements and full extension. Pain was throbbing at rest after performing two workouts the previous day. The pain intensifies after sitting for extended periods but improves with movement. Denies any specific injury or trauma but is active in yard work and has two pit bulls that may run into them. Continues to work out despite the pain. No prior history of knee injury.  Also reports chronic right wrist pain for several years, worsened with extension. Notes right great toe pain during lunges, with difficulty bending the toe backward.  Review of Systems:  Constitutional: Denies fever, weight loss. Musculoskeletal: Positive for left knee pain, right wrist pain, right great toe pain. Neurological: Denies numbness, tingling. Other systems: Negative.  Past Surgical History:  Procedure Laterality  Date   BREAST BIOPSY Left 03/2015   BREAST LUMPECTOMY Left 06/2015    BREAST LUMPECTOMY WITH RADIOACTIVE SEED AND SENTINEL LYMPH NODE BIOPSY Left 06/22/2015   Procedure: LEFT BREAST LUMPECTOMY WITH RADIOACTIVE SEED AND SENTINEL LYMPH NODE BIOPSY;  Surgeon: Abigail Miyamoto, MD;  Location: [facility];  Service: General;  Laterality: Left;   CERVICAL BIOPSY  W/ LOOP ELECTRODE EXCISION  1991   CESAREAN SECTION  2002   CHOLECYSTECTOMY  1998   CYSTOSCOPY N/A 11/29/2018   Procedure: CYSTOSCOPY;  Surgeon: Jerene Bears, MD;  Location: [facility];  Service: Gynecology;  Laterality: N/A;   LYMPHADENECTOMY  1992   single node in right neck   PANNICULECTOMY N/A 07/25/2022   Procedure: PANNICULECTOMY;  Surgeon: Glenna Fellows, MD;  Location: [facility];  Service: Plastics;  Laterality: N/A;   SALPINGECTOMY Left 10/2005   STRABISMUS SURGERY  2016   TOTAL LAPAROSCOPIC HYSTERECTOMY WITH SALPINGECTOMY Bilateral 11/29/2018   Procedure: TOTAL LAPAROSCOPIC HYSTERECTOMY WITH SALPINGECTOMY;  Surgeon: Jerene Bears, MD;  Location: [facility];  Service: Gynecology;  Laterality: Bilateral;  Possible BSO. Extended recovery bed needed    Outpatient Medications Prior to Visit  Medication Sig Dispense Refill   ALPRAZolam (XANAX) 0.5 MG tablet Take 1/2 to 1 tablet every 8 hours as needed for anxiety.  Using sparingly.  Do not use with alcohol. 20 tablet 0   polyethylene glycol powder (GLYCOLAX/MIRALAX) 17 GM/SCOOP powder Take 17 g by mouth daily. 500 g 0   SENNA PO Take by mouth. Senna Leaf     valACYclovir (VALTREX) 500 MG tablet Take 1 tablet daily for suppressive therapy.  With symptoms, increase to bid x 3 days. 30 tablet 3   ibuprofen (ADVIL) 800 MG tablet Take 1 tablet by mouth every 8 (eight) hours as needed. (Patient not taking: Reported on 12/03/2023)     promethazine-dextromethorphan (PROMETHAZINE-DM) 6.25-15 MG/5ML syrup Take 5 mLs by mouth 4 (four) times daily as needed for cough. 118 mL 0   scopolamine  (TRANSDERM-SCOP) 1 MG/3DAYS Place 1 patch (1.5 mg total) onto the skin every 3 (three) days. 10 patch 0   No facility-administered medications prior to visit.    Family History  Problem Relation Age of Onset   Breast cancer Mother 18   Breast cancer Sister 78   Breast cancer Maternal Aunt 60       +calcifications   Prostate cancer Maternal Uncle 67   Heart attack Maternal Grandmother    Heart attack Maternal Grandfather    Prostate cancer Maternal Grandfather 7   Diabetes Paternal Grandmother    Pancreatitis Paternal Grandfather    Prostate cancer Cousin        dx. late 29s    Social History   Socioeconomic History   Marital status: Married    Spouse name: Not on file   Number of children: 2   Years of education: Not on file   Highest education level: Not on file  Occupational History   Not on file  Tobacco Use   Smoking status: Never    Passive exposure: Never   Smokeless tobacco: Never  Vaping Use   Vaping status: Never Used  Substance and Sexual Activity   Alcohol use: Yes    Alcohol/week: 1.0 standard drink of alcohol    Types: 1 Standard drinks or equivalent per week    Comment: social   Drug use: No   Sexual activity: Yes    Birth control/protection:  None  Other Topics Concern   Not on file  Social History Narrative   Not on file   Social Drivers of Health   Financial Resource Strain: Not on file  Food Insecurity: Not on file  Transportation Needs: Not on file  Physical Activity: Not on file  Stress: Not on file  Social Connections: Not on file  Intimate Partner Violence: Not on file                                                                                                  Objective:  Physical Exam: BP 134/78 (BP Location: Right Arm, Patient Position: Sitting, Cuff Size: Normal)   Pulse 78   Temp (!) 97.1 F (36.2 C) (Temporal)   LMP 08/31/2018 (Approximate)   SpO2 97%     Physical Exam Constitutional:      General: She is not  in acute distress.    Appearance: Normal appearance. She is not ill-appearing or toxic-appearing.  HENT:     Head: Normocephalic and atraumatic.     Nose: Nose normal. No congestion.  Eyes:     General: No scleral icterus.    Extraocular Movements: Extraocular movements intact.  Cardiovascular:     Rate and Rhythm: Normal rate and regular rhythm.     Pulses: Normal pulses.     Heart sounds: Normal heart sounds.  Pulmonary:     Effort: Pulmonary effort is normal. No respiratory distress.     Breath sounds: Normal breath sounds.  Abdominal:     General: Abdomen is flat. Bowel sounds are normal.     Palpations: Abdomen is soft.  Musculoskeletal:     Left knee: No swelling, bony tenderness or crepitus. Decreased range of motion. Abnormal meniscus (Positive Thessaly test). Normal alignment and normal patellar mobility.     Instability Tests: Anterior drawer test negative. Posterior drawer test negative.  Lymphadenopathy:     Cervical: No cervical adenopathy.  Skin:    General: Skin is warm and dry.     Findings: No rash.  Neurological:     General: No focal deficit present.     Mental Status: She is alert and oriented to person, place, and time. Mental status is at baseline.  Psychiatric:        Mood and Affect: Mood normal.        Behavior: Behavior normal.        Thought Content: Thought content normal.        Judgment: Judgment normal.    Knee Musculoskeletal Exam  General   Scleral icterus: no   Neurological: alert   No results found.  Recent Results (from the past 2160 hours)  Iron, TIBC and Ferritin Panel     Status: Abnormal   Collection Time: 10/28/23 11:20 AM  Result Value Ref Range   Total Iron Binding Capacity 273 250 - 450 ug/dL   UIBC 657 846 - 962 ug/dL   Iron 952 27 - 841 ug/dL   Iron Saturation 40 15 - 55 %   Ferritin 399 (H) 15 - 150 ng/mL  Garner Nash, MD, MS

## 2023-12-03 NOTE — Patient Instructions (Addendum)
-   Take ibuprofen 400 mg every 4 to 6 hours as needed for knee pain. - Apply Voltaren gel (diclofenac) to your knee as directed. - Follow up with orthopedics for further evaluation as scheduled. Office will call to schedule - Modify your workouts to avoid movements that cause knee pain.  For knee xray, go to:    [place] [facility] [address] [place], [place], [place] [address] Phone: [phone]

## 2023-12-04 ENCOUNTER — Inpatient Hospital Stay: Payer: Medicaid Other

## 2023-12-04 ENCOUNTER — Inpatient Hospital Stay: Payer: Medicaid Other | Admitting: Family

## 2023-12-04 DIAGNOSIS — G8929 Other chronic pain: Secondary | ICD-10-CM | POA: Insufficient documentation

## 2023-12-04 NOTE — Assessment & Plan Note (Signed)
Patient presents with left knee pain for the past two months, exacerbated by lateral movements and full extension. Pain is localized to the anterior knee and worsens after prolonged sitting and increased activity. Positive Thessaly test with pain during twisting motions suggests possible meniscal injury.  Differential Diagnosis:  Meniscal Tear: Positive Thessaly test, pain with twisting movements. Patellofemoral Pain Syndrome: Pain with full extension, anterior knee pain. Osteoarthritis: Less likely due to patient's age and lack of crepitus. IT Band Syndrome: Possible due to lateral knee pain with movement LCL strain: Possible due to lateral knee pain with movement  Plan:  Order X-ray of the left knee to evaluate for structural abnormalities. Prescribe ibuprofen 400?mg every 4-6 hours as needed for pain, with caution due to renal function. Recommend over-the-counter Voltaren gel (diclofenac) for topical pain relief. Administer Toradol 30?mg IM injection today for acute pain relief. Advise patient to limit activities that exacerbate pain; continue low-impact exercises. Refer to orthopedics for further evaluation; consider MRI if symptoms persist.

## 2023-12-07 ENCOUNTER — Encounter: Payer: Self-pay | Admitting: Family Medicine

## 2023-12-07 NOTE — Addendum Note (Signed)
Addended by: Fanny Bien B on: 12/07/2023 11:39 AM   Modules accepted: Orders, Level of Service

## 2023-12-08 DIAGNOSIS — F411 Generalized anxiety disorder: Secondary | ICD-10-CM | POA: Diagnosis not present

## 2023-12-10 ENCOUNTER — Encounter: Payer: Self-pay | Admitting: Physician Assistant

## 2023-12-10 ENCOUNTER — Other Ambulatory Visit (INDEPENDENT_AMBULATORY_CARE_PROVIDER_SITE_OTHER): Payer: Self-pay

## 2023-12-10 ENCOUNTER — Ambulatory Visit: Payer: Medicaid Other | Admitting: Physician Assistant

## 2023-12-10 DIAGNOSIS — M25562 Pain in left knee: Secondary | ICD-10-CM | POA: Diagnosis not present

## 2023-12-10 DIAGNOSIS — G8929 Other chronic pain: Secondary | ICD-10-CM | POA: Diagnosis not present

## 2023-12-10 NOTE — Progress Notes (Signed)
Office Visit Note   Patient: Rhonda Moss           Date of Birth: 07-14-1971           MRN: [id number] Visit Date: 12/10/2023              Requested by: Garnette Gunner, MD [address] [place],  [place] [address] PCP: Garnette Gunner, MD   Assessment & Plan: Visit Diagnoses:  1. Chronic pain of left knee     Plan: Impression is chronic left knee pain.  Symptoms seem consistent with an OA flare, however she has an unremarkable knee exam.  She does have slight discomfort with FADIR and logroll testing.  For now, the topical Voltaren has significantly helped.  I have offered a cortisone injection but she will hold off for now.  Will wait and see what the MRI of the left knee shows and if her symptoms worsen with Voltaren or she wishes to try cortisone injection.  She will let us know.  Call with concerns or questions in the meantime.  Follow-Up Instructions: Return if symptoms worsen or fail to improve.   Orders:  Orders Placed This Encounter  Procedures   XR Pelvis 1-2 Views   No orders of the defined types were placed in this encounter.     Procedures: No procedures performed   Clinical Data: No additional findings.   Subjective: Chief Complaint  Patient presents with   Left Knee - Pain    HPI patient is a pleasant 53 year old female who comes in today with left knee pain since November.  No injury or change in activity.  She just woke up 1 morning after a workout with increased pain.  She is been working on losing weight and her symptoms have somewhat improved.  She tells me over the years that she has had increased left knee pain after gaining weight but typically resolves when losing the weight that she has gained.  The pain she has is to the lateral knee and radiates up into the mid thigh.  No pain in the groin.  She has a sensation at times where she feels her left knee needs to pop.  The pain she has is typically worse with steps as well as going  from a seated to standing position, turning and twisting and when she is performing jumping jacks at the gym.  She has started using topical Voltaren which has significantly helped.  No fevers or chills or other constitutional symptoms.  No previous cortisone injection.  No history of sickle cell disease.  She does have a history of breast cancer.  Recent left knee x-rays obtained showed mild tricompartmental OA as well as a sclerotic lesion to the proximal tibia.  Her PCP has ordered a follow-up MRI of the tibia/fibula.  Review of Systems as detailed in HPI.  All others reviewed and are negative.   Objective: Vital Signs: LMP 08/31/2018 (Approximate)   Physical Exam well-developed well-nourished female no acute distress.  Alert and oriented x 3.  Ortho Exam left knee exam: No effusion.  Range of motion 0 to 120 degrees.  No joint line tenderness.  No patellofemoral crepitus.  Ligament stable.  She does have very little discomfort with logroll and FADIR testing.  No pain with Stinchfield.  She is neurovascularly intact distally.  Specialty Comments:  No specialty comments available.  Imaging: XR Pelvis 1-2 Views Result Date: 12/10/2023 No acute or structural abnormalities  PMFS History: Patient Active Problem List   Diagnosis Date Noted   Chronic pain of left knee 12/04/2023   Intertrigo 08/07/2023   Stage 3a chronic kidney disease (HCC) 03/18/2023   Skin irritation due to excess skin 03/17/2023   Chronic idiopathic constipation 03/17/2023   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   History of breast cancer 07/11/2022   Hyperlipidemia 03/13/2022   Obesity 03/13/2022   IDA (iron deficiency anemia) 11/29/2018   Past Medical History:  Diagnosis Date   Allergy    Anxiety    Breast cancer (HCC) dx. 44 04/11/15   ILC of left breast   Ectopic pregnancy    Family history of breast cancer    Family history of breast cancer    Family history of prostate cancer    H/O: hysterectomy 07/11/2022    History of loop electrical excision procedure (LEEP) 07/11/2022   Low libido 03/13/2022   Malignant neoplasm of upper-outer quadrant of left breast in female, estrogen receptor positive (HCC) 04/29/2015   Panniculitis 07/25/2022   Personal history of radiation therapy     Family History  Problem Relation Age of Onset   Breast cancer Mother 46   Breast cancer Sister 74   Breast cancer Maternal Aunt 60       +calcifications   Prostate cancer Maternal Uncle 67   Heart attack Maternal Grandmother    Heart attack Maternal Grandfather    Prostate cancer Maternal Grandfather 35   Diabetes Paternal Grandmother    Pancreatitis Paternal Grandfather    Prostate cancer Cousin        dx. late 76s    Past Surgical History:  Procedure Laterality Date   BREAST BIOPSY Left 03/2015   BREAST LUMPECTOMY Left 06/2015   BREAST LUMPECTOMY WITH RADIOACTIVE SEED AND SENTINEL LYMPH NODE BIOPSY Left 06/22/2015   Procedure: LEFT BREAST LUMPECTOMY WITH RADIOACTIVE SEED AND SENTINEL LYMPH NODE BIOPSY;  Surgeon: Abigail Miyamoto, MD;  Location: [facility];  Service: General;  Laterality: Left;   CERVICAL BIOPSY  W/ LOOP ELECTRODE EXCISION  1991   CESAREAN SECTION  2002   CHOLECYSTECTOMY  1998   CYSTOSCOPY N/A 11/29/2018   Procedure: CYSTOSCOPY;  Surgeon: Jerene Bears, MD;  Location: [facility];  Service: Gynecology;  Laterality: N/A;   LYMPHADENECTOMY  1992   single node in right neck   PANNICULECTOMY N/A 07/25/2022   Procedure: PANNICULECTOMY;  Surgeon: Glenna Fellows, MD;  Location: [facility];  Service: Plastics;  Laterality: N/A;   SALPINGECTOMY Left 10/2005   STRABISMUS SURGERY  2016   TOTAL LAPAROSCOPIC HYSTERECTOMY WITH SALPINGECTOMY Bilateral 11/29/2018   Procedure: TOTAL LAPAROSCOPIC HYSTERECTOMY WITH SALPINGECTOMY;  Surgeon: Jerene Bears, MD;  Location: [facility];  Service: Gynecology;  Laterality: Bilateral;  Possible BSO. Extended  recovery bed needed   Social History   Occupational History   Not on file  Tobacco Use   Smoking status: Never    Passive exposure: Never   Smokeless tobacco: Never  Vaping Use   Vaping status: Never Used  Substance and Sexual Activity   Alcohol use: Yes    Alcohol/week: 1.0 standard drink of alcohol    Types: 1 Standard drinks or equivalent per week    Comment: social   Drug use: No   Sexual activity: Yes    Birth control/protection: None

## 2023-12-11 ENCOUNTER — Encounter: Payer: Self-pay | Admitting: Family

## 2023-12-11 ENCOUNTER — Inpatient Hospital Stay: Payer: Medicaid Other | Attending: Hematology & Oncology

## 2023-12-11 ENCOUNTER — Inpatient Hospital Stay (HOSPITAL_BASED_OUTPATIENT_CLINIC_OR_DEPARTMENT_OTHER): Payer: Medicaid Other | Admitting: Family

## 2023-12-11 VITALS — BP 122/64 | HR 58 | Temp 98.8°F | Resp 17 | Ht 67.0 in | Wt 229.0 lb

## 2023-12-11 DIAGNOSIS — Z833 Family history of diabetes mellitus: Secondary | ICD-10-CM | POA: Insufficient documentation

## 2023-12-11 DIAGNOSIS — Z862 Personal history of diseases of the blood and blood-forming organs and certain disorders involving the immune mechanism: Secondary | ICD-10-CM | POA: Diagnosis not present

## 2023-12-11 DIAGNOSIS — Z8759 Personal history of other complications of pregnancy, childbirth and the puerperium: Secondary | ICD-10-CM

## 2023-12-11 DIAGNOSIS — Z853 Personal history of malignant neoplasm of breast: Secondary | ICD-10-CM | POA: Diagnosis not present

## 2023-12-11 DIAGNOSIS — Z90710 Acquired absence of both cervix and uterus: Secondary | ICD-10-CM | POA: Diagnosis not present

## 2023-12-11 DIAGNOSIS — N183 Chronic kidney disease, stage 3 unspecified: Secondary | ICD-10-CM

## 2023-12-11 DIAGNOSIS — R200 Anesthesia of skin: Secondary | ICD-10-CM

## 2023-12-11 DIAGNOSIS — M25562 Pain in left knee: Secondary | ICD-10-CM

## 2023-12-11 DIAGNOSIS — R202 Paresthesia of skin: Secondary | ICD-10-CM | POA: Diagnosis not present

## 2023-12-11 DIAGNOSIS — Z803 Family history of malignant neoplasm of breast: Secondary | ICD-10-CM | POA: Diagnosis not present

## 2023-12-11 DIAGNOSIS — Z8249 Family history of ischemic heart disease and other diseases of the circulatory system: Secondary | ICD-10-CM | POA: Insufficient documentation

## 2023-12-11 DIAGNOSIS — Z923 Personal history of irradiation: Secondary | ICD-10-CM | POA: Diagnosis not present

## 2023-12-11 DIAGNOSIS — R7989 Other specified abnormal findings of blood chemistry: Secondary | ICD-10-CM | POA: Diagnosis not present

## 2023-12-11 DIAGNOSIS — Z79899 Other long term (current) drug therapy: Secondary | ICD-10-CM

## 2023-12-11 DIAGNOSIS — R140 Abdominal distension (gaseous): Secondary | ICD-10-CM | POA: Diagnosis not present

## 2023-12-11 DIAGNOSIS — Z8042 Family history of malignant neoplasm of prostate: Secondary | ICD-10-CM | POA: Insufficient documentation

## 2023-12-11 DIAGNOSIS — Z79624 Long term (current) use of inhibitors of nucleotide synthesis: Secondary | ICD-10-CM | POA: Diagnosis not present

## 2023-12-11 DIAGNOSIS — Z882 Allergy status to sulfonamides status: Secondary | ICD-10-CM

## 2023-12-11 DIAGNOSIS — Z9049 Acquired absence of other specified parts of digestive tract: Secondary | ICD-10-CM | POA: Insufficient documentation

## 2023-12-11 DIAGNOSIS — Z9181 History of falling: Secondary | ICD-10-CM | POA: Diagnosis not present

## 2023-12-11 DIAGNOSIS — Z8262 Family history of osteoporosis: Secondary | ICD-10-CM

## 2023-12-11 DIAGNOSIS — Z9079 Acquired absence of other genital organ(s): Secondary | ICD-10-CM | POA: Insufficient documentation

## 2023-12-11 DIAGNOSIS — Z8379 Family history of other diseases of the digestive system: Secondary | ICD-10-CM | POA: Insufficient documentation

## 2023-12-11 LAB — CMP (CANCER CENTER ONLY)
ALT: 11 U/L (ref 0–44)
AST: 18 U/L (ref 15–41)
Albumin: 4.3 g/dL (ref 3.5–5.0)
Alkaline Phosphatase: 74 U/L (ref 38–126)
Anion gap: 8 (ref 5–15)
BUN: 12 mg/dL (ref 6–20)
CO2: 28 mmol/L (ref 22–32)
Calcium: 9.4 mg/dL (ref 8.9–10.3)
Chloride: 104 mmol/L (ref 98–111)
Creatinine: 1.12 mg/dL — ABNORMAL HIGH (ref 0.44–1.00)
GFR, Estimated: 59 mL/min — ABNORMAL LOW (ref >60)
Glucose, Bld: 110 mg/dL — ABNORMAL HIGH (ref 70–99)
Potassium: 4 mmol/L (ref 3.5–5.1)
Sodium: 140 mmol/L (ref 135–145)
Total Bilirubin: 1.2 mg/dL (ref 0.0–1.2)
Total Protein: 7 g/dL (ref 6.5–8.1)

## 2023-12-11 LAB — CBC WITH DIFFERENTIAL (CANCER CENTER ONLY)
Abs Immature Granulocytes: 0.01 10*3/uL (ref 0.00–0.07)
Basophils Absolute: 0 10*3/uL (ref 0.0–0.1)
Basophils Relative: 0 %
Eosinophils Absolute: 0.1 10*3/uL (ref 0.0–0.5)
Eosinophils Relative: 1 %
HCT: 34.8 % — ABNORMAL LOW (ref 36.0–46.0)
Hemoglobin: 11.9 g/dL — ABNORMAL LOW (ref 12.0–15.0)
Immature Granulocytes: 0 %
Lymphocytes Relative: 45 %
Lymphs Abs: 2.1 10*3/uL (ref 0.7–4.0)
MCH: 31.6 pg (ref 26.0–34.0)
MCHC: 34.2 g/dL (ref 30.0–36.0)
MCV: 92.6 fL (ref 80.0–100.0)
Monocytes Absolute: 0.3 10*3/uL (ref 0.1–1.0)
Monocytes Relative: 6 %
Neutro Abs: 2.2 10*3/uL (ref 1.7–7.7)
Neutrophils Relative %: 48 %
Platelet Count: 271 10*3/uL (ref 150–400)
RBC: 3.76 MIL/uL — ABNORMAL LOW (ref 3.87–5.11)
RDW: 12.2 % (ref 11.5–15.5)
WBC Count: 4.6 10*3/uL (ref 4.0–10.5)
nRBC: 0 % (ref 0.0–0.2)

## 2023-12-11 LAB — FERRITIN: Ferritin: 68 ng/mL (ref 11–307)

## 2023-12-11 NOTE — Progress Notes (Signed)
Hematology/Oncology Consultation   Name: Rhonda Moss      MRN: [id number]    Location: Room/bed info not found  Date: 12/11/2023 Time:3:26 PM   REFERRING PHYSICIAN: Jerene Bears, MD  REASON FOR CONSULT:  Elevated ferritin    DIAGNOSIS: Elevated ferritin, reactive  HISTORY OF PRESENT ILLNESS: Rhonda Moss is a very pleasant 53 yo African American female with recent mild increase in ferritin.  She has history of IDA in the past. Hgb is a little low today at 11.9, MCV 92, platelets 271 and WBC count 4.6.  She has not noted any blood loss, abnormal bruising, no petechiae.  No c/o fatigue.  She did get dizzy while working out in August 2024 and fell. No new falls or syncope.  She has pain in the left knee at times when working out.  No known familial history of elevated iron or IDA.  Spleen is in. No known history of liver disease.  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 This was treated with lumpectomy, radiation and completed 5 years of Tamoxifen in 04/2020. She had hysterectomy and left salpingo-oophorectomy in 11/29/2018.  She had genetic testing and was BRCA negative. Her mother, sister and maternal aunt have had breast cancer as well. Maternal uncle, maternal grandfather and cousin had prostate cancer.  Cologuard was negative in 04/2023. Mammogram in 06/2023 was negative.  No history of diabetes or thyroid disease.  She does have history of stage III CKD.  She has 2 children, history of 1 miscarriage and 2 ectopic pregnancies.  No fever, chills, n/v, cough, dizziness, SOB, chest pain, palpitations, abdominal pain or changes in bowel or bladder habits.  She has some eczema on her nose due to the dry air.  She takes a stool softener as needed to prevent constipation.  She notes bloating in her abdomen with certain foods.  She has done an amazing job of exercising 4-5 days a week and eating healthy. She has  lost over 100 lbs! She had panniculectomy in 07/2022 and lipectomy in 07/2023.  Hydration is good. Weight today is 229 lbs.   No smoking, ETOH or recreational drug use.  She has numbness and tingling in the left heel at times. No swelling in her extremities.  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   ROS: All other 10 point review of systems is negative.   PAST MEDICAL HISTORY:   Past Medical History:  Diagnosis Date   Allergy    Anxiety    Breast cancer (HCC) dx. 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   Panniculitis 07/25/2022   Personal history of radiation therapy     ALLERGIES: Allergies  Allergen Reactions   Sulfa Antibiotics Hives      MEDICATIONS:  Current Outpatient Medications on File Prior to Visit  Medication Sig Dispense Refill   ALPRAZolam (XANAX) 0.5 MG tablet Take 1/2 to 1 tablet every 8 hours as needed for anxiety.  Using sparingly.  Do not use with alcohol. 20 tablet 0  ibuprofen (ADVIL) 400 MG tablet Take 1 tablet (400 mg total) by mouth every 6 (six) hours as needed. 30 tablet 0   polyethylene glycol powder (GLYCOLAX/MIRALAX) 17 GM/SCOOP powder Take 17 g by mouth daily. 500 g 0   SENNA PO Take by mouth. Senna Leaf     valACYclovir (VALTREX) 500 MG tablet Take 1 tablet daily for suppressive therapy.  With symptoms, increase to bid x 3 days. 30 tablet 3   No current facility-administered medications on file prior to visit.     PAST SURGICAL HISTORY Past Surgical History:  Procedure Laterality Date   BREAST BIOPSY Left 03/2015   BREAST LUMPECTOMY Left 06/2015   BREAST  LUMPECTOMY WITH RADIOACTIVE SEED AND SENTINEL LYMPH NODE BIOPSY Left 06/22/2015   Procedure: LEFT BREAST LUMPECTOMY WITH RADIOACTIVE SEED AND SENTINEL LYMPH NODE BIOPSY;  Surgeon: Abigail Miyamoto, MD;  Location: [facility];  Service: General;  Laterality: Left;   CERVICAL BIOPSY  W/ LOOP ELECTRODE EXCISION  1991   CESAREAN SECTION  2002   CHOLECYSTECTOMY  1998   CYSTOSCOPY N/A 11/29/2018   Procedure: CYSTOSCOPY;  Surgeon: Jerene Bears, MD;  Location: [facility];  Service: Gynecology;  Laterality: N/A;   LYMPHADENECTOMY  1992   single node in right neck   PANNICULECTOMY N/A 07/25/2022   Procedure: PANNICULECTOMY;  Surgeon: Glenna Fellows, MD;  Location: [facility];  Service: Plastics;  Laterality: N/A;   SALPINGECTOMY Left 10/2005   STRABISMUS SURGERY  2016   TOTAL LAPAROSCOPIC HYSTERECTOMY WITH SALPINGECTOMY Bilateral 11/29/2018   Procedure: TOTAL LAPAROSCOPIC HYSTERECTOMY WITH SALPINGECTOMY;  Surgeon: Jerene Bears, MD;  Location: [facility];  Service: Gynecology;  Laterality: Bilateral;  Possible BSO. Extended recovery bed needed    FAMILY HISTORY: Family History  Problem Relation Age of Onset   Breast cancer Mother 68   Breast cancer Sister 61   Breast cancer Maternal Aunt 60       +calcifications   Prostate cancer Maternal Uncle 67   Heart attack Maternal Grandmother    Heart attack Maternal Grandfather    Prostate cancer Maternal Grandfather 8   Diabetes Paternal Grandmother    Pancreatitis Paternal Grandfather    Prostate cancer Cousin        dx. late 47s    SOCIAL HISTORY:  reports that she has never smoked. She has never been exposed to tobacco smoke. She has never used smokeless tobacco. She reports current alcohol use of about 1.0 standard drink of alcohol per week. She reports that she does not use drugs.  PERFORMANCE STATUS: The patient's performance status is 0 - Asymptomatic  PHYSICAL EXAM: Most  Recent Vital Signs: Blood pressure 122/64, pulse (!) 58, temperature 98.8 F (37.1 C), temperature source Oral, resp. rate 17, height 5\' 7"  (1.702 m), weight 229 lb (103.9 kg), last menstrual period 08/31/2018, SpO2 100%. BP 122/64 (BP Location: Left Arm, Patient Position: Sitting)   Pulse (!) 58   Temp 98.8 F (37.1 C) (Oral)   Resp 17   Ht 5\' 7"  (1.702 m)   Wt 229 lb (103.9 kg)   LMP 08/31/2018 (Approximate)   SpO2 100%   BMI 35.87 kg/m   General Appearance:    Alert, cooperative, no distress, appears stated age  Head:    Normocephalic, without obvious abnormality, atraumatic  Eyes:    PERRL, conjunctiva/corneas clear, EOM's intact, fundi    benign, both eyes        Throat:   Lips,  mucosa, and tongue normal; teeth and gums normal  Neck:   Supple, symmetrical, trachea midline, no adenopathy;    thyroid:  no enlargement/tenderness/nodules; no carotid   bruit or JVD  Back:     Symmetric, no curvature, ROM normal, no CVA tenderness  Lungs:     Clear to auscultation bilaterally, respirations unlabored  Chest Wall:    No tenderness or deformity   Heart:    Regular rate and rhythm, S1 and S2 normal, no murmur, rub   or gallop     Abdomen:     Soft, non-tender, bowel sounds active all four quadrants,    no masses, no organomegaly        Extremities:   Extremities normal, atraumatic, no cyanosis or edema  Pulses:   2+ and symmetric all extremities  Skin:   Skin color, texture, turgor normal, no rashes or lesions  Lymph nodes:   Cervical, supraclavicular, and axillary nodes normal  Neurologic:   CNII-XII intact, normal strength, sensation and reflexes    throughout    LABORATORY DATA:  Results for orders placed or performed in visit on 12/11/23 (from the past 48 hours)  CBC with Differential (Cancer Center Only)     Status: Abnormal   Collection Time: 12/11/23  2:53 PM  Result Value Ref Range   WBC Count 4.6 4.0 - 10.5 K/uL   RBC 3.76 (L) 3.87 - 5.11 MIL/uL   Hemoglobin 11.9  (L) 12.0 - 15.0 g/dL   HCT 82.9 (L) 56.2 - 13.0 %   MCV 92.6 80.0 - 100.0 fL   MCH 31.6 26.0 - 34.0 pg   MCHC 34.2 30.0 - 36.0 g/dL   RDW 86.5 78.4 - 69.6 %   Platelet Count 271 150 - 400 K/uL   nRBC 0.0 0.0 - 0.2 %   Neutrophils Relative % 48 %   Neutro Abs 2.2 1.7 - 7.7 K/uL   Lymphocytes Relative 45 %   Lymphs Abs 2.1 0.7 - 4.0 K/uL   Monocytes Relative 6 %   Monocytes Absolute 0.3 0.1 - 1.0 K/uL   Eosinophils Relative 1 %   Eosinophils Absolute 0.1 0.0 - 0.5 K/uL   Basophils Relative 0 %   Basophils Absolute 0.0 0.0 - 0.1 K/uL   Immature Granulocytes 0 %   Abs Immature Granulocytes 0.01 0.00 - 0.07 K/uL    Comment: Performed at [facility] at [facility], [address] [place], [place] [address]      RADIOGRAPHY: XR Pelvis 1-2 Views Result Date: 12/10/2023 No acute or structural abnormalities      PATHOLOGY: None  ASSESSMENT/PLAN: Ms. Pickron is a very pleasant 53 yo African American female with recent mild increase in ferritin. Hemochromatosis DNA is negative.  Elevation in ferritin is felt to be reactive in nature as it is now back down to normal at 69 with iron saturation remaining stable at 29%.   At this time we will release her back to her PCP. No follow-up needed.   All questions were answered. The patient knows to call the clinic with any problems, questions or concerns. We can certainly see her again if needed for any heme/onc issues that may arise.   The patient was discussed with Dr. Myna Hidalgo and he is in agreement with the aforementioned.   Eileen Stanford, NP    Addendum: Called patient with lab results. No questions or concerns at this time. Patient appreciative of call.

## 2023-12-14 LAB — IRON AND IRON BINDING CAPACITY (CC-WL,HP ONLY)
Iron: 83 ug/dL (ref 28–170)
Saturation Ratios: 29 % (ref 10.4–31.8)
TIBC: 287 ug/dL (ref 250–450)
UIBC: 204 ug/dL (ref 148–442)

## 2023-12-17 LAB — HEMOCHROMATOSIS DNA-PCR(C282Y,H63D)

## 2023-12-20 ENCOUNTER — Ambulatory Visit (HOSPITAL_BASED_OUTPATIENT_CLINIC_OR_DEPARTMENT_OTHER)
Admission: RE | Admit: 2023-12-20 | Discharge: 2023-12-20 | Disposition: A | Discharge status: Home / Self Care | Payer: Medicaid Other | Source: Ambulatory Visit | Attending: Family Medicine | Admitting: Family Medicine

## 2023-12-20 DIAGNOSIS — M25562 Pain in left knee: Secondary | ICD-10-CM | POA: Insufficient documentation

## 2023-12-20 DIAGNOSIS — G8929 Other chronic pain: Secondary | ICD-10-CM | POA: Insufficient documentation

## 2023-12-20 DIAGNOSIS — S83282A Other tear of lateral meniscus, current injury, left knee, initial encounter: Secondary | ICD-10-CM | POA: Diagnosis not present

## 2023-12-22 DIAGNOSIS — F411 Generalized anxiety disorder: Secondary | ICD-10-CM | POA: Diagnosis not present

## 2023-12-24 ENCOUNTER — Ambulatory Visit: Payer: Medicaid Other | Admitting: Family Medicine

## 2023-12-28 ENCOUNTER — Encounter: Payer: Self-pay | Admitting: Family Medicine

## 2024-01-04 ENCOUNTER — Ambulatory Visit (INDEPENDENT_AMBULATORY_CARE_PROVIDER_SITE_OTHER): Payer: Medicaid Other | Admitting: Family Medicine

## 2024-01-04 ENCOUNTER — Encounter: Payer: Self-pay | Admitting: Family Medicine

## 2024-01-04 VITALS — BP 118/82 | HR 88 | Temp 97.6°F | Wt 222.2 lb

## 2024-01-04 DIAGNOSIS — D1622 Benign neoplasm of long bones of left lower limb: Secondary | ICD-10-CM | POA: Diagnosis not present

## 2024-01-04 DIAGNOSIS — N1831 Chronic kidney disease, stage 3a: Secondary | ICD-10-CM | POA: Diagnosis not present

## 2024-01-04 DIAGNOSIS — R7989 Other specified abnormal findings of blood chemistry: Secondary | ICD-10-CM | POA: Diagnosis not present

## 2024-01-04 DIAGNOSIS — B351 Tinea unguium: Secondary | ICD-10-CM | POA: Diagnosis not present

## 2024-01-04 NOTE — Progress Notes (Unsigned)
Assessment/Plan:   Problem List Items Addressed This Visit       Genitourinary   Stage 3a chronic kidney disease (HCC) - Primary   Relevant Orders   Urinalysis w microscopic + reflex cultur    There are no discontinued medications.  No follow-ups on file.    Subjective:   Encounter date: 01/04/2024  Rhonda Moss is a 53 y.o. female who has IDA (iron deficiency anemia); Hyperlipidemia; Obesity; HSV-2 infection; History of breast cancer; Skin irritation due to excess skin; Chronic idiopathic constipation; Stage 3a chronic kidney disease (HCC); Intertrigo; and Chronic pain of left knee on their problem list..   She  has a past medical history of Allergy, Anxiety, Breast cancer (HCC) (dx. 44 04/11/15), Ectopic pregnancy, Family history of breast cancer, Family history of breast cancer, Family history of prostate cancer, H/O: hysterectomy (07/11/2022), History of loop electrical excision procedure (LEEP) (07/11/2022), Low libido (03/13/2022), Malignant neoplasm of upper-outer quadrant of left breast in female, estrogen receptor positive (HCC) (04/29/2015), Panniculitis (07/25/2022), and Personal history of radiation therapy..   She presents with chief complaint of Medical Management of Chronic Issues (Knee pain follow up. Knee is feeling better. Great right toe pain and numbness. Not sure if it is the toenail. Worse at night. ) .   HPI:   ROS  Past Surgical History:  Procedure Laterality Date   BREAST BIOPSY Left 03/2015   BREAST LUMPECTOMY Left 06/2015   BREAST LUMPECTOMY WITH RADIOACTIVE SEED AND SENTINEL LYMPH NODE BIOPSY Left 06/22/2015   Procedure: LEFT BREAST LUMPECTOMY WITH RADIOACTIVE SEED AND SENTINEL LYMPH NODE BIOPSY;  Surgeon: Abigail Miyamoto, MD;  Location: [facility];  Service: General;  Laterality: Left;   CERVICAL BIOPSY  W/ LOOP ELECTRODE EXCISION  1991   CESAREAN SECTION  2002   CHOLECYSTECTOMY  1998   CYSTOSCOPY N/A 11/29/2018   Procedure:  CYSTOSCOPY;  Surgeon: Jerene Bears, MD;  Location: [facility];  Service: Gynecology;  Laterality: N/A;   LYMPHADENECTOMY  1992   single node in right neck   PANNICULECTOMY N/A 07/25/2022   Procedure: PANNICULECTOMY;  Surgeon: Glenna Fellows, MD;  Location: [facility];  Service: Plastics;  Laterality: N/A;   SALPINGECTOMY Left 10/2005   STRABISMUS SURGERY  2016   TOTAL LAPAROSCOPIC HYSTERECTOMY WITH SALPINGECTOMY Bilateral 11/29/2018   Procedure: TOTAL LAPAROSCOPIC HYSTERECTOMY WITH SALPINGECTOMY;  Surgeon: Jerene Bears, MD;  [facility]  Service: Gynecology;  Laterality: Bilateral;  Possible BSO. Extended recovery bed needed    Outpatient Medications Prior to Visit  Medication Sig Dispense Refill   ALPRAZolam (XANAX) 0.5 MG tablet Take 1/2 to 1 tablet every 8 hours as needed for anxiety.  Using sparingly.  Do not use with alcohol. 20 tablet 0   ibuprofen (ADVIL) 400 MG tablet Take 1 tablet (400 mg total) by mouth every 6 (six) hours as needed. 30 tablet 0   polyethylene glycol powder (GLYCOLAX/MIRALAX) 17 GM/SCOOP powder Take 17 g by mouth daily. 500 g 0   SENNA PO Take by mouth. Senna Leaf     valACYclovir (VALTREX) 500 MG tablet Take 1 tablet daily for suppressive therapy.  With symptoms, increase to bid x 3 days. 30 tablet 3   No facility-administered medications prior to visit.    Family History  Problem Relation Age of Onset   Breast cancer Mother 81   Breast cancer Sister 71   Breast cancer Maternal Aunt 60       +  calcifications   Prostate cancer Maternal Uncle 67   Heart attack Maternal Grandmother    Heart attack Maternal Grandfather    Prostate cancer Maternal Grandfather 16   Diabetes Paternal Grandmother    Pancreatitis Paternal Grandfather    Prostate cancer Cousin        dx. late 72s    Social History   Socioeconomic History   Marital status: Married    Spouse name: Not on file   Number of children: 2    Years of education: Not on file   Highest education level: Associate degree: occupational, Scientist, product/process development, or vocational program  Occupational History   Not on file  Tobacco Use   Smoking status: Never    Passive exposure: Never   Smokeless tobacco: Never  Vaping Use   Vaping status: Never Used  Substance and Sexual Activity   Alcohol use: Yes    Alcohol/week: 1.0 standard drink of alcohol    Types: 1 Standard drinks or equivalent per week    Comment: social   Drug use: No   Sexual activity: Yes    Birth control/protection: None  Other Topics Concern   Not on file  Social History Narrative   Not on file   Social Drivers of Health   Financial Resource Strain: Medium Risk (01/03/2024)   Overall Financial Resource Strain (CARDIA)    Difficulty of Paying Living Expenses: Somewhat hard  Food Insecurity: Food Insecurity Present (01/03/2024)   Hunger Vital Sign    Worried About Running Out of Food in the Last Year: Sometimes true    Ran Out of Food in the Last Year: Sometimes true  Transportation Needs: No Transportation Needs (01/03/2024)   PRAPARE - Administrator, Civil Service (Medical): No    Lack of Transportation (Non-Medical): No  Physical Activity: Sufficiently Active (01/03/2024)   Exercise Vital Sign    Days of Exercise per Week: 5 days    Minutes of Exercise per Session: 60 min  Stress: Stress Concern Present (01/03/2024)   [employer] of Occupational Health - Occupational Stress Questionnaire    Feeling of Stress : To some extent  Social Connections: Socially Integrated (01/03/2024)   Social Connection and Isolation Panel [NHANES]    Frequency of Communication with Friends and Family: More than three times a week    Frequency of Social Gatherings with Friends and Family: More than three times a week    Attends Religious Services: 1 to 4 times per year    Active Member of [employer] or Organizations: Yes    Attends Engineer, structural: More than 4  times per year    Marital Status: Married  Catering manager Violence: Patient Declined (12/11/2023)   Humiliation, Afraid, Rape, and Kick questionnaire    Fear of Current or Ex-Partner: Patient declined    Emotionally Abused: Patient declined    Physically Abused: Patient declined    Sexually Abused: Patient declined  Objective:  Physical Exam: BP 118/82   Pulse 88   Temp 97.6 F (36.4 C) (Temporal)   Wt 222 lb 3.2 oz (100.8 kg)   LMP 08/31/2018 (Approximate)   SpO2 99%   BMI 34.80 kg/m     Physical Exam  MR TIBIA FIBULA LEFT WO CONTRAST Result Date: 12/28/2023 CLINICAL DATA:  Bone lesion seen on recent knee x-ray 12/05/2023. Patient presents for further characterization. EXAM: MRI OF LOWER LEFT EXTREMITY WITHOUT CONTRAST TECHNIQUE: Multiplanar, multisequence MR imaging of the left lower leg was performed. No intravenous contrast was administered. COMPARISON:  Knee x-ray 12/03/2023 FINDINGS: Bones/Joint/Cartilage No fracture or dislocation. Normal alignment. No joint effusion. 2.9 x 1.8 x 4.5 cm well defined intramedullary bone lesion in the proximal tibial metadiaphysis with heterogeneous T2 hyperintensity and central areas of low signal as well as areas of T1 hyperintensity. No surrounding bone marrow edema, bone destruction, periosteal reaction or soft tissue mass. Oblique tear of the posterior horn lateral meniscus extending to the inferior articular surface. Ligaments ACL and PCL are intact. Medial collateral ligament is intact. Lateral collateral ligament complex is intact. Muscles and Tendons Muscles are normal. No muscle atrophy. Quadriceps tendon and patellar tendon are intact. Soft tissue No fluid collection or hematoma.  No soft tissue mass. IMPRESSION: 1. A 2.9 x 1.8 x 4.5 cm well defined intramedullary bone lesion in the proximal tibial metadiaphysis without aggressive features  most consistent with an enchondroma. If the area becomes painful, recommend an MRI of the lower leg without and with intravenous contrast at that time. 2. Oblique tear of the posterior horn lateral meniscus extending to the inferior articular surface. Electronically Signed   By: Elige Ko M.D.   On: 12/28/2023 15:25   XR Pelvis 1-2 Views Result Date: 12/10/2023 No acute or structural abnormalities  DG Knee Complete 4 Views Left Result Date: 12/06/2023 CLINICAL DATA:  Chronic left knee pain. EXAM: LEFT KNEE - COMPLETE 4+ VIEW COMPARISON:  None Available. FINDINGS: The alignment joint spaces are preserved. There is minor tricompartmental peripheral spurring. Mixed lytic and sclerotic lesion in the proximal tibial metaphysis. There is no associated periosteal reaction. No significant knee joint effusion. No erosive change. Unremarkable soft tissues. IMPRESSION: 1. Minor tricompartmental osteoarthritis. 2. Mixed lytic and sclerotic lesion in the proximal tibial metaphysis. This may represent a bone infarct, however is nonspecific by radiograph. Consider MRI for further assessment. Electronically Signed   By: Narda Rutherford M.D.   On: 12/06/2023 18:46    Recent Results (from the past 2160 hours)  Iron, TIBC and Ferritin Panel     Status: Abnormal   Collection Time: 10/28/23 11:20 AM  Result Value Ref Range   Total Iron Binding Capacity 273 250 - 450 ug/dL   UIBC 846 962 - 952 ug/dL   Iron 841 27 - 324 ug/dL   Iron Saturation 40 15 - 55 %   Ferritin 399 (H) 15 - 150 ng/mL  CBC with Differential (Cancer Center Only)     Status: Abnormal   Collection Time: 12/11/23  2:53 PM  Result Value Ref Range   WBC Count 4.6 4.0 - 10.5 K/uL   RBC 3.76 (L) 3.87 - 5.11 MIL/uL   Hemoglobin 11.9 (L) 12.0 - 15.0 g/dL   HCT 40.1 (L) 02.7 - 25.3 %   MCV 92.6 80.0 - 100.0 fL   MCH 31.6 26.0 - 34.0 pg   MCHC 34.2 30.0 - 36.0 g/dL   RDW 66.4 40.3 - 47.4 %   Platelet  Count 271 150 - 400 K/uL   nRBC 0.0 0.0 - 0.2  %   Neutrophils Relative % 48 %   Neutro Abs 2.2 1.7 - 7.7 K/uL   Lymphocytes Relative 45 %   Lymphs Abs 2.1 0.7 - 4.0 K/uL   Monocytes Relative 6 %   Monocytes Absolute 0.3 0.1 - 1.0 K/uL   Eosinophils Relative 1 %   Eosinophils Absolute 0.1 0.0 - 0.5 K/uL   Basophils Relative 0 %   Basophils Absolute 0.0 0.0 - 0.1 K/uL   Immature Granulocytes 0 %   Abs Immature Granulocytes 0.01 0.00 - 0.07 K/uL    Comment: Performed at [facility], [address], [place], [place] [address]  CMP (Cancer Center only)     Status: Abnormal   Collection Time: 12/11/23  2:53 PM  Result Value Ref Range   Sodium 140 135 - 145 mmol/L   Potassium 4.0 3.5 - 5.1 mmol/L   Chloride 104 98 - 111 mmol/L   CO2 28 22 - 32 mmol/L   Glucose, Bld 110 (H) 70 - 99 mg/dL    Comment: Glucose reference range applies only to samples taken after fasting for at least 8 hours.   BUN 12 6 - 20 mg/dL   Creatinine 6.04 (H) 5.40 - 1.00 mg/dL   Calcium 9.4 8.9 - 98.1 mg/dL   Total Protein 7.0 6.5 - 8.1 g/dL   Albumin 4.3 3.5 - 5.0 g/dL   AST 18 15 - 41 U/L   ALT 11 0 - 44 U/L   Alkaline Phosphatase 74 38 - 126 U/L   Total Bilirubin 1.2 0.0 - 1.2 mg/dL   GFR, Estimated 59 (L) >60 mL/min    Comment: (NOTE) Calculated using the CKD-EPI Creatinine Equation (2021)    Anion gap 8 5 - 15    Comment: Performed at [facility], [address] [place], [place] [address]  Ferritin     Status: None   Collection Time: 12/11/23  2:53 PM  Result Value Ref Range   Ferritin 68 11 - 307 ng/mL    Comment: Performed at [facility], [address], [place], [place] [address]  Iron and Iron Binding Capacity ([facility] only)     Status: None   Collection Time: 12/11/23  2:53 PM  Result Value Ref Range   Iron 83 28 - 170 ug/dL   TIBC 213 086 - 578 ug/dL   Saturation Ratios 29 10.4 - 31.8 %   UIBC 204 148 - 442 ug/dL    Comment:  Performed at [facility], [address] [place], [place] [address]  Hemochromatosis DNA, PCR     Status: None   Collection Time: 12/11/23  2:53 PM  Result Value Ref Range   DNA Mutation Analysis Comment     Comment: (NOTE) Result: c.845G>A (p.Cys282Tyr) - Not Detected c.187C>G (p.His63Asp) - Not Detected c.193A>T (p.Ser65Cys) - Not Detected Not associated with increased risk to develop clinical symptoms of Hereditary Hemochromatosis. In symptomatic individuals, other causes of iron overload should be evaluated. See Additional Information and Comments. Additional Clinical Information: Hereditary hemochromatosis (HFE related) is an autosomal recessive iron storage disorder. Patients may have a genetic diagnosis of hereditary hemochromatosis and never show clinical symptoms. Clinical symptoms typically appear between 40 to 60 years in males and after menopause in females. Signs and symptoms may include organ damage, primarily in the  liver, risk for hepatocellular carcinoma, diabetes, and heart disease due to iron accumulation. Life expectancy may be decreased in individuals who develop cirrhosis. Treatment for clinically symptomatic individuals may include therapeutic phlebotomy. L iver transplant may be used to treat end stage liver failure. For preventive care, monitoring for iron overload is recommended for patients who are homozygous for c.845G>A (p.Cys282Tyr) and have yet to experience clinical symptoms. Comments: The most common HFE variants associated with hereditary hemochromatosis are c.845G>A (p.Cys282Tyr), c.187C>G (p.His63Asp), c.193A>T (p.Ser65Cys). While patients homozygous for c.845G>A (p.Cys282Tyr) are the most likely to present clinical symptoms, less than 10% develop clinically significant iron overload with tissue and organ damage. Genetic counseling is recommended to discuss the potential clinical implications of positive results, as  well as recommendations for testing family members. Genetic Coordinators are available for health care providers to discuss results at [phone]). Test Details: Three variants analyzed: c.845G>A (p.Cys282Tyr), commonly referred to as C282Y c.187C>G (p.His63Asp), commonly referred to a s H63D c.193A>T (p.Ser65Cys), commonly referred to as S65C Methods/Limitations: DNA Analysis of the HFE gene (NM_[id number]) was performed by PCR amplification followed by restriction enzyme digestion analyses. Results must be combined with clinical information for the most accurate interpretation. Molecular-based testing is highly accurate, but as in any laboratory test, diagnostic errors may occur. False positive or false negative results may occur for reasons that include genetic variants, blood transfusions, bone marrow transplantation, somatic or tissue-specific mosaicism, mislabeled samples, or erroneous representation of family relationships. This test was developed and its performance characteristics determined by [facility]. It has not been cleared or approved by the Food and Drug Administration. References: Kennyth Arnold, [address], Kowdley Clarnce Flock LW, [place] AS; American Association for the Study of Liver Diseases. Diagnosis and management of hemochromatosis: 2011  practice guideline by the American Association for the Study of Liver Diseases. Hepatology. 2011 Jul;54(1):328-43. doi: 10.1002/hep.24330. PMID: 96045409; PMCID: WJX9147829. [address], Brissot P, Swinkels DW, [employer], Kamarainen O, Patton S, Alonso I, Morris M, Keeney S. EMQN best practice guidelines for the molecular genetic diagnosis of hereditary hemochromatosis [facility]). Eur J Hum Genet. 2016 Apr;24(4):479-95. doi: 10.1038/ejhg.2015.128. Epub 2015 Jul 8. PMID: 56213086; PMCID: VHQ4696295.    Reviewed by: Comment     Comment: (NOTE) Technical Component performed at [employer] [facility] Professional Component performed by: [employer], Ph.D., [facility] Psychiatric Director, Molecular Genetics [phone] [address] [place] [address] Performed At: [facility] [address] [place], [place] [id number] Maurine Simmering MDPhD QI:[id number]         Garner Nash, MD, MS

## 2024-01-05 DIAGNOSIS — R7989 Other specified abnormal findings of blood chemistry: Secondary | ICD-10-CM | POA: Insufficient documentation

## 2024-01-05 DIAGNOSIS — D1622 Benign neoplasm of long bones of left lower limb: Secondary | ICD-10-CM | POA: Insufficient documentation

## 2024-01-05 DIAGNOSIS — B351 Tinea unguium: Secondary | ICD-10-CM | POA: Insufficient documentation

## 2024-01-05 DIAGNOSIS — F411 Generalized anxiety disorder: Secondary | ICD-10-CM | POA: Diagnosis not present

## 2024-01-20 DIAGNOSIS — F411 Generalized anxiety disorder: Secondary | ICD-10-CM | POA: Diagnosis not present

## 2024-02-02 DIAGNOSIS — F411 Generalized anxiety disorder: Secondary | ICD-10-CM | POA: Diagnosis not present

## 2024-02-03 ENCOUNTER — Ambulatory Visit (INDEPENDENT_AMBULATORY_CARE_PROVIDER_SITE_OTHER): Admitting: Physician Assistant

## 2024-02-03 DIAGNOSIS — M25562 Pain in left knee: Secondary | ICD-10-CM | POA: Diagnosis not present

## 2024-02-03 DIAGNOSIS — G8929 Other chronic pain: Secondary | ICD-10-CM

## 2024-02-03 MED ORDER — METHYLPREDNISOLONE ACETATE 40 MG/ML IJ SUSP
40.0000 mg | INTRAMUSCULAR | Status: AC | PRN
  Administered 2024-02-03: 40 mg via INTRA_ARTICULAR

## 2024-02-03 MED ORDER — LIDOCAINE HCL 1 % IJ SOLN
2.0000 mL | INTRAMUSCULAR | Status: AC | PRN
  Administered 2024-02-03: 2 mL

## 2024-02-03 MED ORDER — BUPIVACAINE HCL 0.25 % IJ SOLN
2.0000 mL | INTRAMUSCULAR | Status: AC | PRN
  Administered 2024-02-03: 2 mL via INTRA_ARTICULAR

## 2024-02-03 NOTE — Progress Notes (Signed)
 Office Visit Note   Patient: Rhonda Moss           Date of Birth: 11/23/1970           MRN: [id number] Visit Date: 02/03/2024              Requested by: Garnette Gunner, MD [address] [place],  [place] [address] PCP: Garnette Gunner, MD   Assessment & Plan: Visit Diagnoses:  1. Chronic pain of left knee     Plan: Impression is chronic left knee pain.  MRI shows an oblique tear of the lateral meniscus.  Patient is currently not experiencing any mechanical symptoms.  We did discuss cortisone injection versus surgical debridement with a knee arthroscopy.  She would like to hold off on surgery and would like to try cortisone injection first.  I am agreeable to this plan.  She will follow-up with [place] as needed.  Call with concerns or questions.  Follow-Up Instructions: Return if symptoms worsen or fail to improve.   Orders:  Orders Placed This Encounter  Procedures   Large Joint Inj   No orders of the defined types were placed in this encounter.     Procedures: Large Joint Inj: L knee on 02/03/2024 10:56 AM Indications: pain Details: 22 G needle, anterolateral approach Medications: 2 mL lidocaine 1 %; 2 mL bupivacaine 0.25 %; 40 mg methylPREDNISolone acetate 40 MG/ML      Clinical Data: No additional findings.   Subjective: Chief Complaint  Patient presents with   Left Knee - Pain    HPI patient is a pleasant 53 year old female who comes in today with recurrent left knee pain.  She was seen by [place] back in January for this.  Pain initially started in November after she has been working out.  No specific injury.  The pain she was having was to the lateral knee with some radiation into the thigh.  No mechanical symptoms and at that point her exam was unremarkable.  She did undergo an MRI of the tibia/fibula for a bony lesion in the proximal tibia.  This showed an congenital but also showed a lateral meniscus tear.  Patient is currently more symptomatic.   She is unable to pivot or step such as when she is line dancing.  She has been taking over-the-counter medication but is unable to take NSAIDs for long periods of time due to renal issues.  No mechanical symptoms at this point.  No previous cortisone injection.  Review of Systems as detailed in HPI.  All others you and are negative.   Objective: Vital Signs: LMP 08/31/2018 (Approximate)   Physical Exam well-developed well-nourished female in no acute distress.  Alert and oriented x 3.  Ortho Exam left knee exam shows no effusion.  Range of motion 0 to 120 degrees.  Mild lateral joint line tenderness.  She is neurovascularly intact distally.  Specialty Comments:  No specialty comments available.  Imaging: No new imaging   PMFS History: Patient Active Problem List   Diagnosis Date Noted   Enchondroma of left tibia 01/05/2024   Onychomycosis 01/05/2024   Elevated ferritin 01/05/2024   Chronic pain of left knee 12/04/2023   Intertrigo 08/07/2023   Stage 3a chronic kidney disease (HCC) 03/18/2023   Skin irritation due to excess skin 03/17/2023   Chronic idiopathic constipation 03/17/2023   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   History of breast cancer 07/11/2022   Hyperlipidemia 03/13/2022   Obesity 03/13/2022   IDA (iron  deficiency anemia) 11/29/2018   Past Medical History:  Diagnosis Date   Allergy    Anxiety    Breast cancer (HCC) dx. 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   Panniculitis 07/25/2022   Personal history of radiation therapy     Family History  Problem Relation Age of Onset   Breast cancer Mother 50   Breast  cancer Sister 73   Breast cancer Maternal Aunt 60       +calcifications   Prostate cancer Maternal Uncle 67   Heart attack Maternal Grandmother    Heart attack Maternal Grandfather    Prostate cancer Maternal Grandfather 31   Diabetes Paternal Grandmother    Pancreatitis Paternal Grandfather    Prostate cancer Cousin        dx. late 89s    Past Surgical History:  Procedure Laterality Date   BREAST BIOPSY Left 03/2015   BREAST LUMPECTOMY Left 06/2015   BREAST LUMPECTOMY WITH RADIOACTIVE SEED AND SENTINEL LYMPH NODE BIOPSY Left 06/22/2015   Procedure: LEFT BREAST LUMPECTOMY WITH RADIOACTIVE SEED AND SENTINEL LYMPH NODE BIOPSY;  Surgeon: Abigail Miyamoto, MD;  Location: [facility];  Service: General;  Laterality: Left;   CERVICAL BIOPSY  W/ LOOP ELECTRODE EXCISION  1991   CESAREAN SECTION  2002   CHOLECYSTECTOMY  1998   CYSTOSCOPY N/A 11/29/2018   Procedure: CYSTOSCOPY;  Surgeon: Jerene Bears, MD;  Location: [facility];  Service: Gynecology;  Laterality: N/A;   LYMPHADENECTOMY  1992   single node in right neck   PANNICULECTOMY N/A 07/25/2022   Procedure: PANNICULECTOMY;  Surgeon: Glenna Fellows, MD;  Location:  SURGERY CENTER;  Service: Plastics;  Laterality: N/A;   SALPINGECTOMY Left 10/2005   STRABISMUS SURGERY  2016   TOTAL LAPAROSCOPIC HYSTERECTOMY WITH SALPINGECTOMY Bilateral 11/29/2018   Procedure: TOTAL LAPAROSCOPIC HYSTERECTOMY WITH SALPINGECTOMY;  Surgeon: Jerene Bears, MD;  Location: [facility];  Service: Gynecology;  Laterality: Bilateral;  Possible BSO. Extended recovery bed needed   Social History   Occupational History   Not on file  Tobacco Use   Smoking status: Never    Passive exposure: Never   Smokeless tobacco: Never  Vaping Use   Vaping status: Never Used  Substance and Sexual Activity   Alcohol use: Yes    Alcohol/week: 1.0 standard drink of alcohol    Types: 1 Standard drinks or equivalent per  week    Comment: social   Drug use: No   Sexual activity: Yes    Birth control/protection: None

## 2024-03-01 DIAGNOSIS — F411 Generalized anxiety disorder: Secondary | ICD-10-CM | POA: Diagnosis not present

## 2024-03-30 DIAGNOSIS — F411 Generalized anxiety disorder: Secondary | ICD-10-CM | POA: Diagnosis not present

## 2024-05-30 ENCOUNTER — Other Ambulatory Visit: Payer: Self-pay | Admitting: Family Medicine

## 2024-05-30 DIAGNOSIS — Z1231 Encounter for screening mammogram for malignant neoplasm of breast: Secondary | ICD-10-CM

## 2024-06-22 ENCOUNTER — Ambulatory Visit
Admission: RE | Admit: 2024-06-22 | Discharge: 2024-06-22 | Disposition: A | Discharge status: Home / Self Care | Source: Ambulatory Visit | Attending: Family Medicine | Admitting: Family Medicine

## 2024-06-22 DIAGNOSIS — Z1231 Encounter for screening mammogram for malignant neoplasm of breast: Secondary | ICD-10-CM

## 2024-06-22 DIAGNOSIS — F411 Generalized anxiety disorder: Secondary | ICD-10-CM | POA: Diagnosis not present

## 2024-07-26 DIAGNOSIS — F411 Generalized anxiety disorder: Secondary | ICD-10-CM | POA: Diagnosis not present

## 2024-09-19 ENCOUNTER — Encounter: Payer: Self-pay | Admitting: Radiology

## 2024-11-24 ENCOUNTER — Ambulatory Visit: Admitting: Physician Assistant

## 2024-11-24 DIAGNOSIS — G8929 Other chronic pain: Secondary | ICD-10-CM | POA: Diagnosis not present

## 2024-11-24 DIAGNOSIS — M25562 Pain in left knee: Secondary | ICD-10-CM

## 2024-11-24 DIAGNOSIS — M25531 Pain in right wrist: Secondary | ICD-10-CM | POA: Diagnosis not present

## 2024-11-24 DIAGNOSIS — M25532 Pain in left wrist: Secondary | ICD-10-CM

## 2024-11-24 MED ORDER — BUPIVACAINE HCL 0.25 % IJ SOLN
2.0000 mL | INTRAMUSCULAR | Status: AC | PRN
  Administered 2024-11-24: 2 mL via INTRA_ARTICULAR

## 2024-11-24 MED ORDER — METHYLPREDNISOLONE ACETATE 40 MG/ML IJ SUSP
40.0000 mg | INTRAMUSCULAR | Status: AC | PRN
  Administered 2024-11-24: 40 mg via INTRA_ARTICULAR

## 2024-11-24 MED ORDER — LIDOCAINE HCL 1 % IJ SOLN
2.0000 mL | INTRAMUSCULAR | Status: AC | PRN
  Administered 2024-11-24: 2 mL

## 2024-11-24 NOTE — Progress Notes (Signed)
 "  Office Visit Note   Patient: Rhonda Moss           Date of Birth: 02/17/1971           MRN: [id number] Visit Date: 11/24/2024              Requested by: Sebastian Beverley NOVAK, MD [address] [place],  [place] [address] PCP: Sebastian Beverley NOVAK, MD   Assessment & Plan: Visit Diagnoses:  1. Chronic pain of left knee   2. Bilateral wrist pain     Plan: Impression is left knee pain likely aggravation of underlying lateral meniscus tear and left wrist pain likely ECU tendinitis.  In regards to the left knee, we have discussed repeating cortisone injection today for which she is agreeable to.  In regards to the left wrist, I do not feel wrist splint will help as she is not having any pain at rest.  I have discussed topical anti-inflammatories and backing off any provoking activities.  She understands and agrees.  For her right foot, I have recommended follow-up with Dr. Harden for further evaluation and treatment recommendation.  Call with concerns or questions.  Follow-Up Instructions: Return if symptoms worsen or fail to improve.   Orders:  Orders Placed This Encounter  Procedures   Large Joint Inj: L knee   No orders of the defined types were placed in this encounter.     Procedures: Large Joint Inj: L knee on 11/24/2024 11:21 AM Indications: pain Details: 22 G needle, anterolateral approach Medications: 2 mL lidocaine  1 %; 2 mL bupivacaine  0.25 %; 40 mg methylPREDNISolone  acetate 40 MG/ML      Clinical Data: No additional findings.   Subjective: Chief Complaint  Patient presents with   Left Knee - Pain    HPI patient is a pleasant 54 year old female who comes in today with recurrent left knee pain in addition to bilateral wrist pain left greater than right and right foot pain.  In regards to her left knee, we saw her for this about a year ago where MRI demonstrated a lateral meniscus tear.  At that time, we proceeded with cortisone injection.  She did have some  relief until about a week ago.  She was speed walking when she injured her knee.  No actual fall.  The pain she is having is to the lateral joint line.  Pain occurs and is described as a sharp shooting pain when she is pivoting as well as when she is doing jumping jacks.  No real locking or catching or instability.  She does workout 4 days a week.  In regards to her left wrist, she is having pain to the ulnar side.  This typically only occurs when she is doing [employer] and hyperextending her wrist.  She denies any pain at rest.  She does note occasional pain that radiates up the forearm.  She takes occasional NSAIDs but is not supposed be doing this due to renal issues.  She has not tried wearing a brace.  In regards to the right wrist, she does have mild discomfort over the first dorsal compartment.  This typically occurs when working out.  No paresthesias.  In regards to the right foot, she has a large bunion which seems to be bothering her with activity.  Review of Systems as detailed in HPI.  All others reviewed and are negative.   Objective: Vital Signs: LMP 08/31/2018   Physical Exam well-developed and well-nourished female in no acute  distress.  Alert and oriented x 3.  Ortho Exam left knee exam: Trace effusion.  Range of motion 0 to 120 degrees.  She does have lateral joint line tenderness.  She is neurovascular intact distally.  Left wrist exam: She has tenderness along the ulnar styloid.  No pain with compressing the TFCC.  No tenderness of the forearm increased pain with wrist extension.  No pain with wrist flexion or radial deviation.  Right wrist exam: No tenderness over the first dorsal compartment.  Negative Finkelstein.  Painless range of motion.  She is neurovascular intact distally.  Specialty Comments:  No specialty comments available.  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   Panniculitis 07/25/2022   Personal history of radiation therapy     Family History  Problem Relation Age of Onset   Breast cancer Mother 58   Breast cancer Sister 19   Breast cancer Maternal Aunt 60       +calcifications   Prostate cancer Maternal Uncle 67   Heart attack Maternal Grandmother    Heart attack Maternal Grandfather    Prostate cancer Maternal Grandfather 57   Diabetes Paternal Grandmother    Pancreatitis Paternal Grandfather    Prostate cancer Cousin        dx. late 64s    Past Surgical History:  Procedure Laterality Date   BREAST BIOPSY Left 03/2015   BREAST LUMPECTOMY Left 06/2015   BREAST LUMPECTOMY WITH RADIOACTIVE SEED AND SENTINEL LYMPH NODE BIOPSY Left 06/22/2015   Procedure: LEFT BREAST LUMPECTOMY WITH RADIOACTIVE SEED AND SENTINEL LYMPH NODE BIOPSY;  Surgeon: Vicenta Poli, MD;  Location: [facility];  Service: General;  Laterality: Left;   CERVICAL BIOPSY  W/ LOOP ELECTRODE EXCISION  1991   CESAREAN SECTION  2002   CHOLECYSTECTOMY  1998    CYSTOSCOPY N/A 11/29/2018   Procedure: CYSTOSCOPY;  Surgeon: Cleotilde Ronal RAMAN, MD;  Location: [facility];  Service: Gynecology;  Laterality: N/A;   LYMPHADENECTOMY  1992   single node in right neck   PANNICULECTOMY N/A 07/25/2022   Procedure: PANNICULECTOMY;  Surgeon: Arelia Filippo, MD;  Location: [facility];  Service: Plastics;  Laterality: N/A;   SALPINGECTOMY Left 10/2005   STRABISMUS SURGERY  2016   TOTAL LAPAROSCOPIC HYSTERECTOMY WITH SALPINGECTOMY Bilateral 11/29/2018   Procedure: TOTAL LAPAROSCOPIC HYSTERECTOMY WITH SALPINGECTOMY;  Surgeon: Cleotilde Ronal RAMAN, MD;  Location: [facility];  Service: Gynecology;  Laterality: Bilateral;  Possible BSO. Extended recovery bed needed   Social History   Occupational History   Not on file  Tobacco Use   Smoking status: Never    Passive exposure: Never   Smokeless tobacco: Never  Vaping Use   Vaping status: Never Used  Substance and Sexual Activity   Alcohol use: Yes    Alcohol/week: 1.0 standard drink of alcohol    Types: 1 Standard drinks or equivalent per week    Comment: social   Drug use: No   Sexual activity: Yes    Birth control/protection: None        "

## 2024-12-11 ENCOUNTER — Encounter (HOSPITAL_BASED_OUTPATIENT_CLINIC_OR_DEPARTMENT_OTHER): Payer: Self-pay

## 2024-12-12 ENCOUNTER — Ambulatory Visit (HOSPITAL_BASED_OUTPATIENT_CLINIC_OR_DEPARTMENT_OTHER): Admitting: Obstetrics & Gynecology

## 2024-12-12 ENCOUNTER — Encounter (HOSPITAL_BASED_OUTPATIENT_CLINIC_OR_DEPARTMENT_OTHER): Payer: Self-pay

## 2024-12-21 ENCOUNTER — Other Ambulatory Visit (HOSPITAL_BASED_OUTPATIENT_CLINIC_OR_DEPARTMENT_OTHER): Payer: Self-pay | Admitting: Obstetrics & Gynecology

## 2024-12-21 DIAGNOSIS — T753XXA Motion sickness, initial encounter: Secondary | ICD-10-CM

## 2024-12-21 DIAGNOSIS — F419 Anxiety disorder, unspecified: Secondary | ICD-10-CM

## 2024-12-21 MED ORDER — SCOPOLAMINE 1 MG/3DAYS TD PT72: 1.0000 | MEDICATED_PATCH | TRANSDERMAL | 1 refills | Status: AC

## 2024-12-21 MED ORDER — ALPRAZOLAM 0.5 MG PO TABS: ORAL_TABLET | ORAL | 0 refills | Status: AC

## 2025-01-20 ENCOUNTER — Ambulatory Visit (HOSPITAL_BASED_OUTPATIENT_CLINIC_OR_DEPARTMENT_OTHER): Payer: Self-pay | Admitting: Obstetrics & Gynecology
